# Patient Record
Sex: Female | Born: 1955 | Race: White | Hispanic: No | Marital: Married | Smoking: Current every day smoker
Health system: Southern US, Community
[De-identification: ages and names within clinical notes are randomized; demographics above are authoritative.]

## PROBLEM LIST (undated history)

## (undated) DIAGNOSIS — E2740 Unspecified adrenocortical insufficiency: Secondary | ICD-10-CM

## (undated) DIAGNOSIS — T7840XA Allergy, unspecified, initial encounter: Secondary | ICD-10-CM

## (undated) DIAGNOSIS — E237 Disorder of pituitary gland, unspecified: Secondary | ICD-10-CM

## (undated) HISTORY — PX: ABDOMINAL HYSTERECTOMY: SHX81

## (undated) HISTORY — DX: Unspecified adrenocortical insufficiency: E27.40

## (undated) HISTORY — PX: TUBAL LIGATION: SHX77

## (undated) HISTORY — PX: TONSILLECTOMY: SUR1361

## (undated) HISTORY — DX: Disorder of pituitary gland, unspecified: E23.7

## (undated) HISTORY — PX: CHOLECYSTECTOMY: SHX55

## (undated) HISTORY — DX: Allergy, unspecified, initial encounter: T78.40XA

## (undated) HISTORY — PX: OTHER SURGICAL HISTORY: SHX169

---

## 1998-09-13 ENCOUNTER — Ambulatory Visit (HOSPITAL_COMMUNITY): Admission: RE | Admit: 1998-09-13 | Discharge: 1998-09-13 | Payer: Self-pay | Admitting: Urology

## 1999-06-30 ENCOUNTER — Other Ambulatory Visit: Admission: RE | Admit: 1999-06-30 | Discharge: 1999-06-30 | Payer: Self-pay | Admitting: *Deleted

## 1999-07-17 ENCOUNTER — Other Ambulatory Visit: Admission: RE | Admit: 1999-07-17 | Discharge: 1999-07-17 | Payer: Self-pay | Admitting: Radiology

## 1999-07-30 ENCOUNTER — Other Ambulatory Visit: Admission: RE | Admit: 1999-07-30 | Discharge: 1999-07-30 | Payer: Self-pay | Admitting: *Deleted

## 1999-07-30 ENCOUNTER — Encounter (INDEPENDENT_AMBULATORY_CARE_PROVIDER_SITE_OTHER): Payer: Self-pay

## 2000-02-28 ENCOUNTER — Emergency Department (HOSPITAL_COMMUNITY): Admission: EM | Admit: 2000-02-28 | Discharge: 2000-02-28 | Payer: Self-pay | Admitting: *Deleted

## 2000-03-05 ENCOUNTER — Ambulatory Visit (HOSPITAL_COMMUNITY): Admission: RE | Admit: 2000-03-05 | Discharge: 2000-03-05 | Payer: Self-pay | Admitting: Internal Medicine

## 2000-03-11 ENCOUNTER — Encounter: Payer: Self-pay | Admitting: Orthopedic Surgery

## 2000-03-11 ENCOUNTER — Encounter: Admission: RE | Admit: 2000-03-11 | Discharge: 2000-03-11 | Payer: Self-pay | Admitting: Orthopedic Surgery

## 2000-05-23 ENCOUNTER — Emergency Department (HOSPITAL_COMMUNITY): Admission: EM | Admit: 2000-05-23 | Discharge: 2000-05-23 | Payer: Self-pay | Admitting: Emergency Medicine

## 2000-08-22 ENCOUNTER — Emergency Department (HOSPITAL_COMMUNITY): Admission: EM | Admit: 2000-08-22 | Discharge: 2000-08-22 | Payer: Self-pay | Admitting: Emergency Medicine

## 2000-09-07 ENCOUNTER — Encounter (INDEPENDENT_AMBULATORY_CARE_PROVIDER_SITE_OTHER): Payer: Self-pay | Admitting: Specialist

## 2000-09-07 ENCOUNTER — Other Ambulatory Visit: Admission: RE | Admit: 2000-09-07 | Discharge: 2000-09-07 | Payer: Self-pay | Admitting: *Deleted

## 2000-10-04 ENCOUNTER — Other Ambulatory Visit: Admission: RE | Admit: 2000-10-04 | Discharge: 2000-10-04 | Payer: Self-pay | Admitting: *Deleted

## 2000-10-12 ENCOUNTER — Encounter: Payer: Self-pay | Admitting: Obstetrics and Gynecology

## 2000-10-18 ENCOUNTER — Inpatient Hospital Stay (HOSPITAL_COMMUNITY): Admission: RE | Admit: 2000-10-18 | Discharge: 2000-10-20 | Payer: Self-pay | Admitting: Obstetrics and Gynecology

## 2001-01-06 ENCOUNTER — Ambulatory Visit (HOSPITAL_BASED_OUTPATIENT_CLINIC_OR_DEPARTMENT_OTHER): Admission: RE | Admit: 2001-01-06 | Discharge: 2001-01-07 | Payer: Self-pay | Admitting: Orthopedic Surgery

## 2001-02-11 ENCOUNTER — Encounter: Admission: RE | Admit: 2001-02-11 | Discharge: 2001-02-11 | Payer: Self-pay | Admitting: Orthopedic Surgery

## 2001-02-11 ENCOUNTER — Encounter: Payer: Self-pay | Admitting: Orthopedic Surgery

## 2001-07-23 ENCOUNTER — Inpatient Hospital Stay (HOSPITAL_COMMUNITY): Admission: EM | Admit: 2001-07-23 | Discharge: 2001-07-26 | Payer: Self-pay | Admitting: Emergency Medicine

## 2001-07-23 ENCOUNTER — Encounter: Payer: Self-pay | Admitting: Family Medicine

## 2001-07-23 ENCOUNTER — Encounter: Payer: Self-pay | Admitting: Emergency Medicine

## 2001-07-25 ENCOUNTER — Encounter: Payer: Self-pay | Admitting: Family Medicine

## 2001-07-25 ENCOUNTER — Encounter: Admission: RE | Admit: 2001-07-25 | Discharge: 2001-07-25 | Payer: Self-pay | Admitting: Family Medicine

## 2001-07-28 ENCOUNTER — Encounter: Admission: RE | Admit: 2001-07-28 | Discharge: 2001-07-28 | Payer: Self-pay | Admitting: Family Medicine

## 2002-02-27 ENCOUNTER — Encounter: Payer: Self-pay | Admitting: Family Medicine

## 2002-02-27 ENCOUNTER — Encounter: Admission: RE | Admit: 2002-02-27 | Discharge: 2002-02-27 | Payer: Self-pay | Admitting: Neurosurgery

## 2002-03-16 ENCOUNTER — Encounter: Admission: RE | Admit: 2002-03-16 | Discharge: 2002-03-16 | Payer: Self-pay | Admitting: Neurosurgery

## 2002-03-16 ENCOUNTER — Encounter: Payer: Self-pay | Admitting: Neurosurgery

## 2002-03-30 ENCOUNTER — Encounter: Payer: Self-pay | Admitting: Neurosurgery

## 2002-03-30 ENCOUNTER — Encounter: Admission: RE | Admit: 2002-03-30 | Discharge: 2002-03-30 | Payer: Self-pay | Admitting: Neurosurgery

## 2002-04-13 ENCOUNTER — Encounter: Payer: Self-pay | Admitting: Neurosurgery

## 2002-04-13 ENCOUNTER — Encounter: Admission: RE | Admit: 2002-04-13 | Discharge: 2002-04-13 | Payer: Self-pay | Admitting: Neurosurgery

## 2003-05-23 ENCOUNTER — Emergency Department (HOSPITAL_COMMUNITY): Admission: EM | Admit: 2003-05-23 | Discharge: 2003-05-23 | Payer: Self-pay | Admitting: Emergency Medicine

## 2004-01-10 ENCOUNTER — Encounter: Admission: RE | Admit: 2004-01-10 | Discharge: 2004-01-10 | Payer: Self-pay | Admitting: Orthopedic Surgery

## 2004-03-18 ENCOUNTER — Emergency Department (HOSPITAL_COMMUNITY): Admission: EM | Admit: 2004-03-18 | Discharge: 2004-03-18 | Payer: Self-pay | Admitting: Emergency Medicine

## 2004-05-29 ENCOUNTER — Ambulatory Visit (HOSPITAL_BASED_OUTPATIENT_CLINIC_OR_DEPARTMENT_OTHER): Admission: RE | Admit: 2004-05-29 | Discharge: 2004-05-29 | Payer: Self-pay | Admitting: Orthopedic Surgery

## 2004-06-23 ENCOUNTER — Encounter: Admission: RE | Admit: 2004-06-23 | Discharge: 2004-06-23 | Payer: Self-pay | Admitting: Neurosurgery

## 2004-09-17 ENCOUNTER — Encounter: Admission: RE | Admit: 2004-09-17 | Discharge: 2004-09-17 | Payer: Self-pay | Admitting: Sports Medicine

## 2004-10-01 ENCOUNTER — Ambulatory Visit (HOSPITAL_BASED_OUTPATIENT_CLINIC_OR_DEPARTMENT_OTHER): Admission: RE | Admit: 2004-10-01 | Discharge: 2004-10-01 | Payer: Self-pay | Admitting: Orthopedic Surgery

## 2005-07-16 ENCOUNTER — Inpatient Hospital Stay (HOSPITAL_COMMUNITY): Admission: EM | Admit: 2005-07-16 | Discharge: 2005-07-22 | Payer: Self-pay | Admitting: Emergency Medicine

## 2005-07-21 ENCOUNTER — Encounter (INDEPENDENT_AMBULATORY_CARE_PROVIDER_SITE_OTHER): Payer: Self-pay | Admitting: Specialist

## 2006-07-01 ENCOUNTER — Encounter: Admission: RE | Admit: 2006-07-01 | Discharge: 2006-07-01 | Payer: Self-pay | Admitting: Urology

## 2008-05-07 ENCOUNTER — Emergency Department (HOSPITAL_BASED_OUTPATIENT_CLINIC_OR_DEPARTMENT_OTHER): Admission: EM | Admit: 2008-05-07 | Discharge: 2008-05-07 | Payer: Self-pay | Admitting: Emergency Medicine

## 2008-06-29 ENCOUNTER — Encounter: Admission: RE | Admit: 2008-06-29 | Discharge: 2008-06-29 | Payer: Self-pay | Admitting: Gastroenterology

## 2008-07-29 ENCOUNTER — Inpatient Hospital Stay (HOSPITAL_COMMUNITY): Admission: EM | Admit: 2008-07-29 | Discharge: 2008-08-01 | Payer: Self-pay | Admitting: Emergency Medicine

## 2009-05-01 IMAGING — US US ABDOMEN COMPLETE
1 series · 14 of 25 positions shown · non-contrast
Comparison: [REDACTED] abdominal ultrasound 10 [id number], intraoperative
cholangiogram 07/21/2005, [REDACTED] [HOSPITAL] [HOSPITAL] CT angio
chest 05/07/2008.

CLINICAL DATA: Right upper quadrant abdominal pain.
Postcholecystectomy 4229.

ABDOMEN ULTRASOUND
TECHNIQUE: Complete abdominal ultrasound examination was performed
including evaluation of the liver, bile ducts, pancreas, kidneys,
spleen, IVC, and abdominal aorta.

[Series 1: us abdomen complete · 0.32mm/px · 14 of 65 slices shown]
[im 1/65]
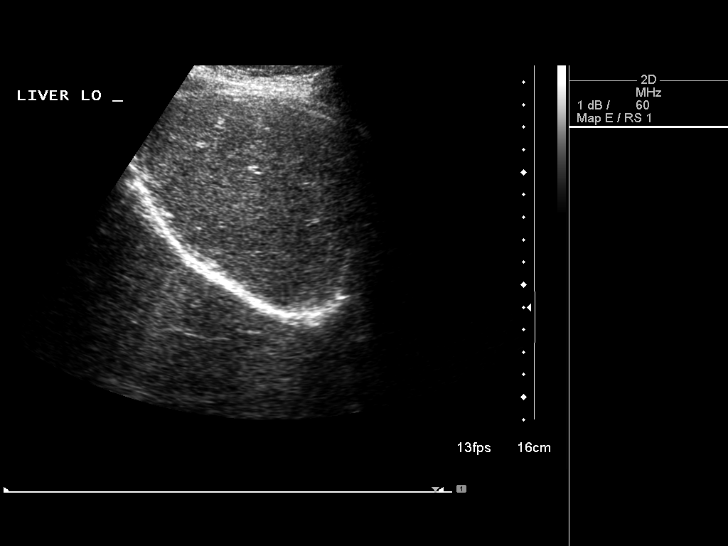
[im 6/65]
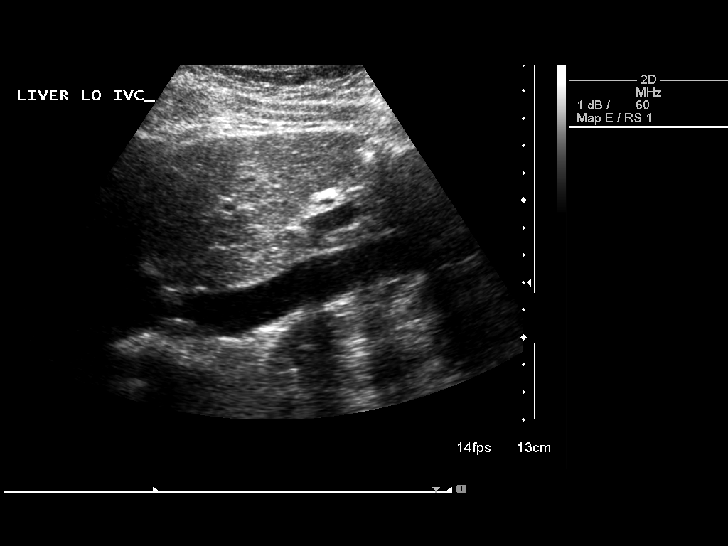
[im 11/65]
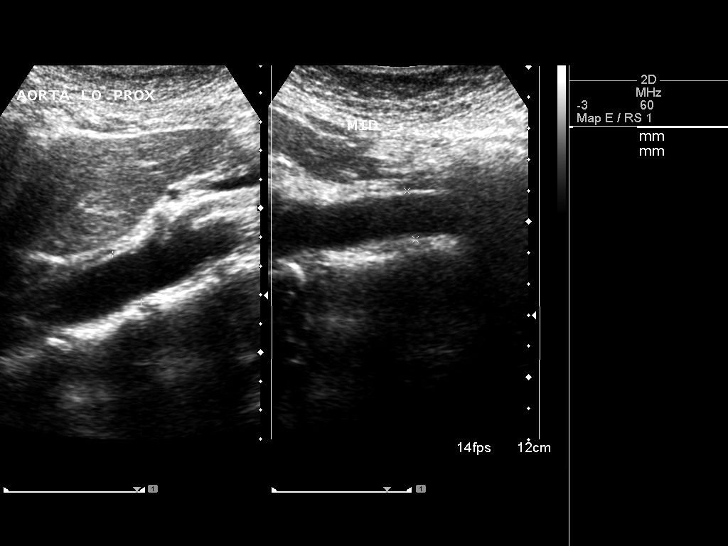
[im 17/65]
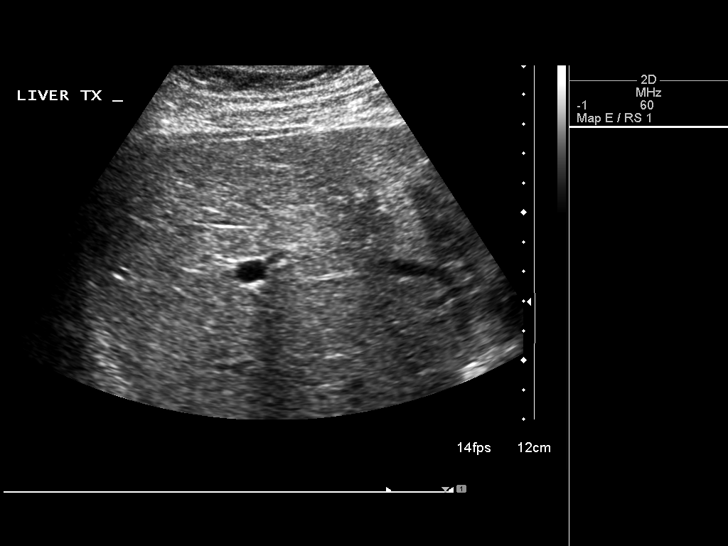
[im 22/65]
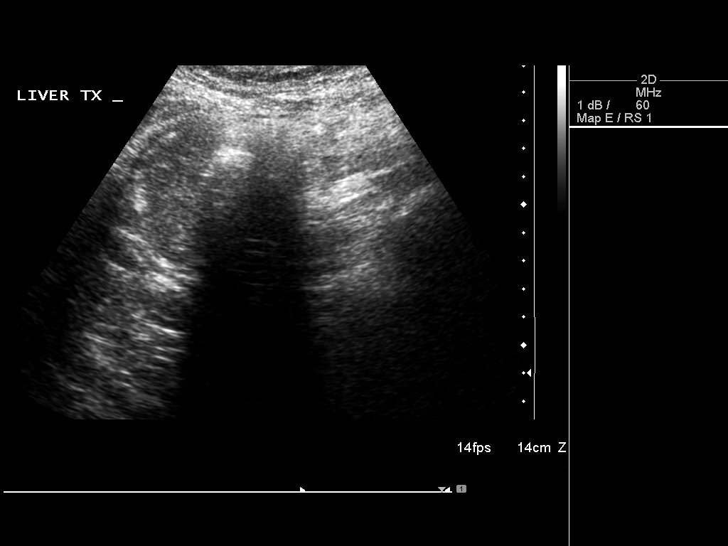
[im 25/65]
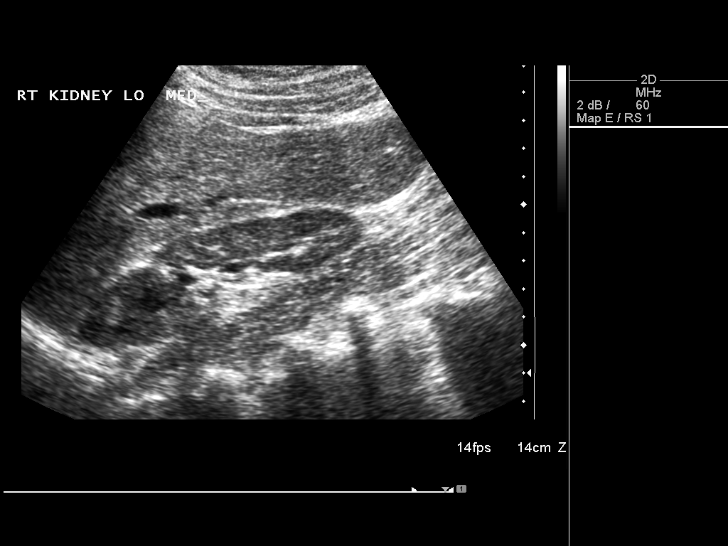
[im 30/65]
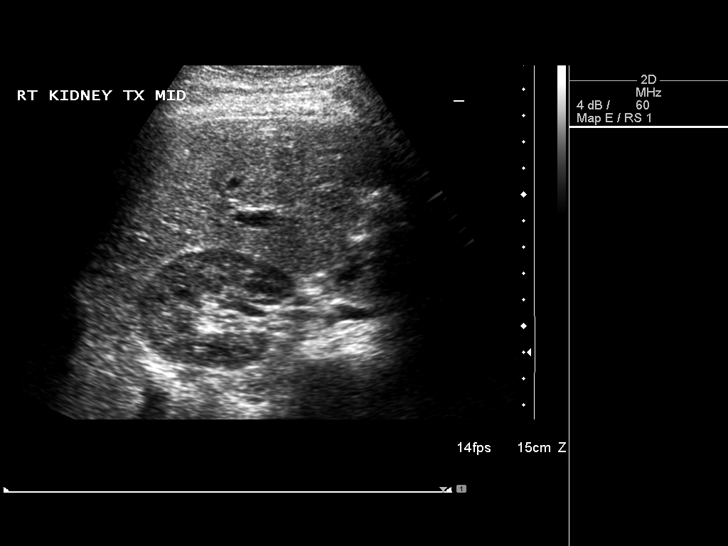
[im 35/65]
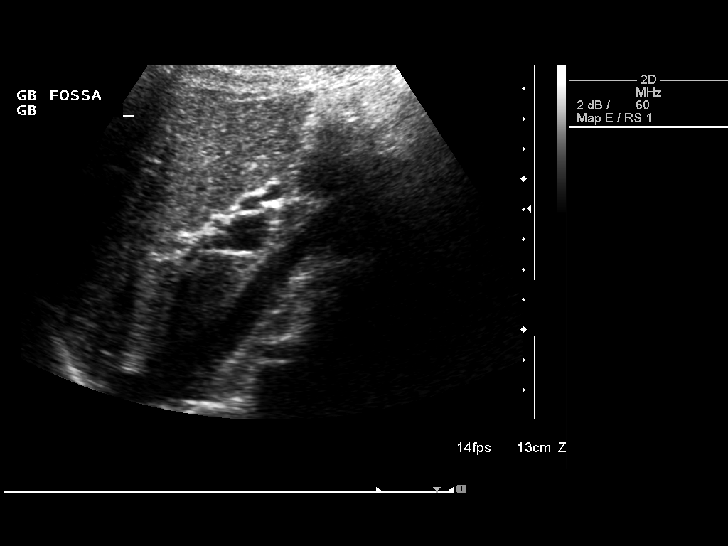
[im 41/65]
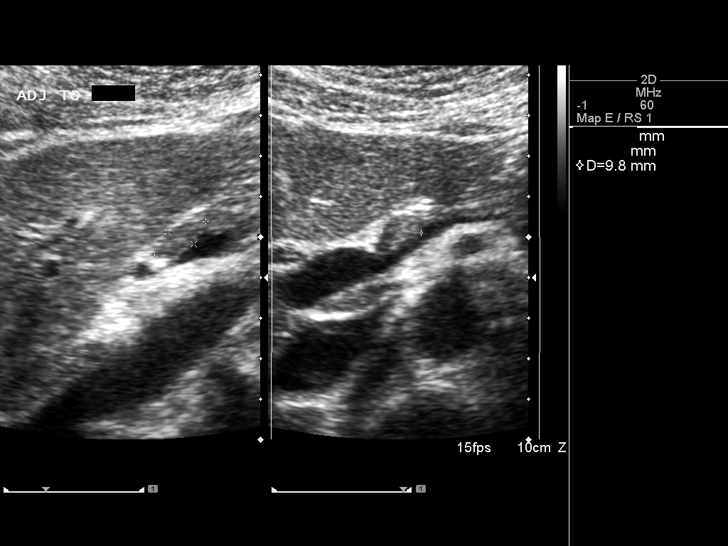
[im 43/65]
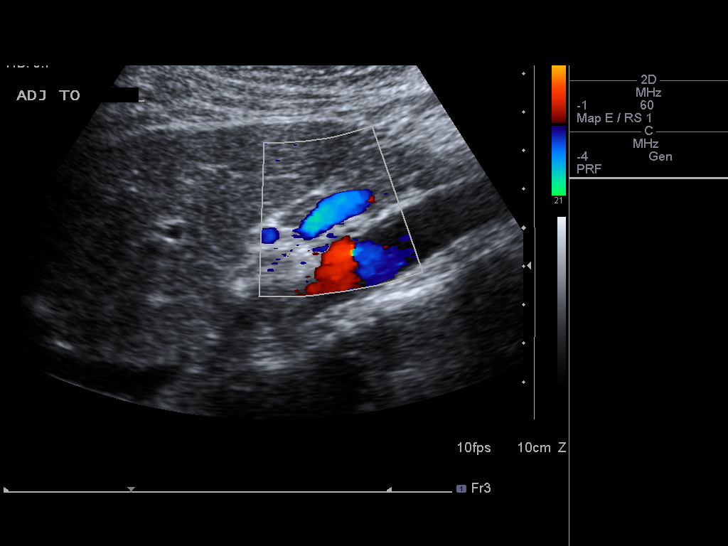
[im 49/65]
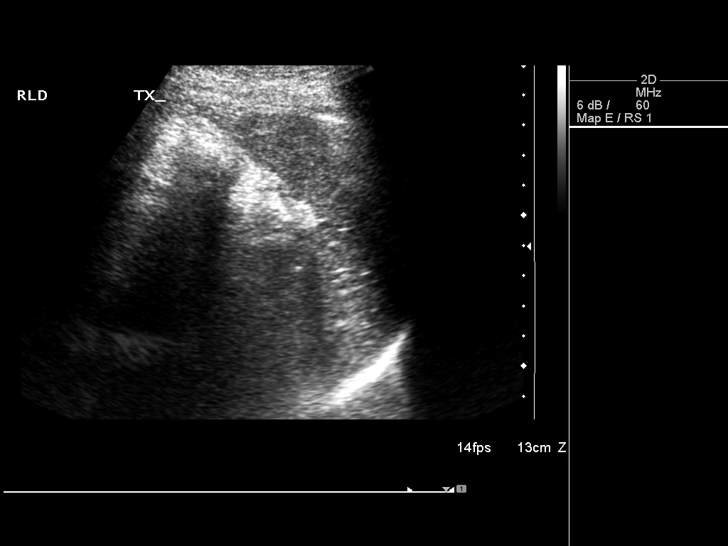
[im 54/65]
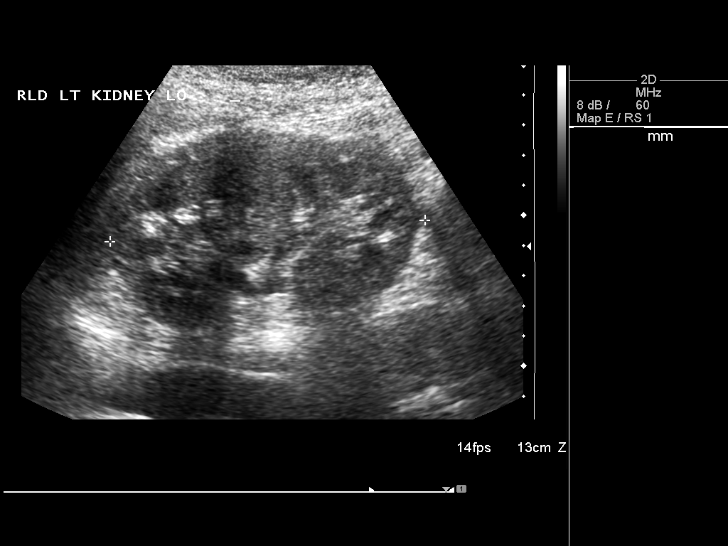
[im 59/65]
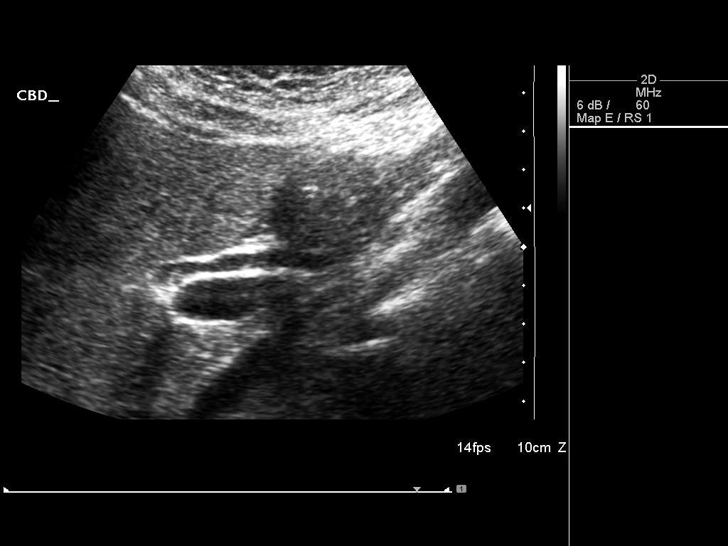
[im 65/65]
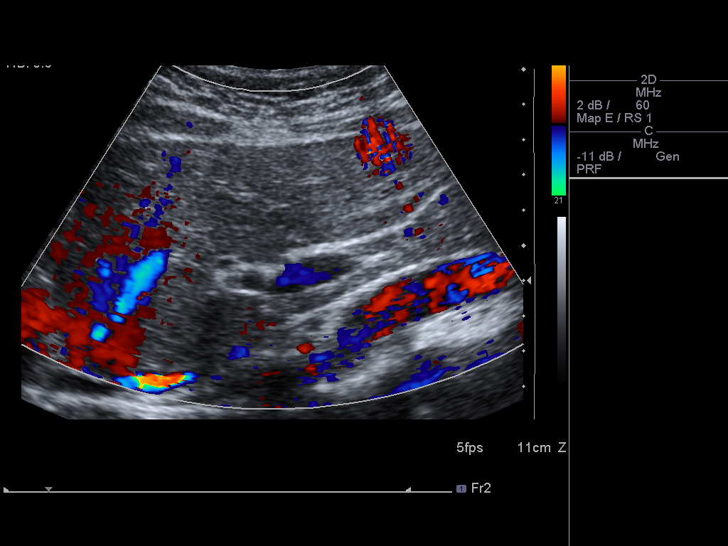

[14 of 25 positions shown; findings below may reference images not displayed]

FINDINGS: The patient is post cholecystectomy with no dilated
intrahepatic or extrahepatic bile ducts.  Common bile duct measures
normally at 7 mm for postcholecystectomy state.  Visualized liver,
inferior vena cava, pancreas, spleen (3.3 cm X 9.6 cm), right
kidney (11.3 cm long), left kidney (10.5 cm long), and abdominal
aorta (maximum diameter 1.9 cm) appear sonographically normal. No
free fluid noted.  Two portacaval lymph nodes with short axis each
measuring 0.7 cm visualized are likely benign.
IMPRESSION: 1.  Post cholecystectomy.
2.  Likely benign two small size portacaval lymph nodes.
3.  Otherwise, normal.

## 2010-10-20 ENCOUNTER — Encounter: Payer: Self-pay | Admitting: Gastroenterology

## 2011-02-10 NOTE — Consult Note (Signed)
NAME:  Elizabeth Lamb, Elizabeth Lamb              ACCOUNT NO.:  [id number]   MEDICAL RECORD NO.:  [id number]          PATIENT TYPE:  INP   LOCATION:  1414                         FACILITY:  [facility]   PHYSICIAN:  Michael L. Reynolds, M.D.DATE OF BIRTH:  May 30, 1956   DATE OF CONSULTATION:  DATE OF DISCHARGE:                                 CONSULTATION   REASON FOR CONSULTATION:  Migraine headache.   HISTORY OF PRESENT ILLNESS:  This is a 55 year old Caucasian female who  presents with intractable migraine headache today.  The patient's  husband states that on the night of October 31 to the morning of  November 1 the patient woke up secondary to eating some bad chicken  soup.  The patient had nausea and vomiting at approximately 2:30 in the  a.m.  Upon nausea and vomiting, the patient had an instantaneous  headache.  The patient took a Percocet and a Phenergan trying to abort  her headache; however, these medications did not help.  Approximately  around 3 a.m. the patient had significant headache in which she  describes it as throbbing behind bilateral eyes and in the frontal  region of her head.  The pain did not radiate to her neck, did not  radiate down her spine.  She had no Lhermitte's, she had no stiff neck,  no photophobia, phonophobia.  She had no aura or paresthesia.  However,  she was not able to keep her food down and unable to keep her  medications down.  The patient, at 3 o'clock in the morning on November  1, was brought to the [facility] ER as her headache was not decreasing.  It is noted that husband states she had no altered speech, no focal or  neurological deficits.  As states she denied blurred vision.  Her  cognition was completely intact throughout the time period of her  headache.  The patient does state that she has a history of migraines  and her typical symptoms include photosensitivity.  She feels better  when she is in a dark room.  Associated with caffeine.  These  headaches  often occur when she takes caffeine or has high, increased stress.  The  patient had a similar incident of migraine where she was seen by Dr.  Anne Hahn back in 2002.  At that time she presented with an altered mental  status and it was found that she had hypokalemia and hyponatremia.  Lumbar puncture taken at that time also showed no abnormalities.  At the  time of interview, the patient is alert, is oriented to time and place.  Is able to converse without difficulty.  She is stating that she has  significant amount of headache, 10 out of 10, bilaterally behind  periorbital region and frontal aspect of her head.  She states that she  has no photophobia but little or slight phonophobia.  Upon talking to  the husband it was noted that patient had recently lost her job on  Thursday due to number of missed days.  According to husband she has  been under a significant amount of stress since  that time.  It is noted  that the husband stated in 2002 workup resulted in what husband calls a  basilar migraine headache.  I could not find this in the notes of the  workup that Dr. Anne Hahn had done.  Under the 2002 note it was noted that  the MRI that was taken showed no stroke and no HSV encephalitis.  As  stated CSF was negative for any bacteria or meningitis; however, at that  time she was still released with 10 days' worth of acyclovir.  On  today's admission the patient was noted also to have hyponatremia once  again in which her sodium levels were 121 at admission, now 124.   PAST MEDICAL HISTORY:  Migraine, depression.   PAST SURGICAL HISTORY:  Cholecystectomy, hysterectomy, tubal ligation,  bilateral rotator cuff repair.   MEDICATIONS:  At home she is only on multivitamin.  While in the  hospital she is on aspirin 81 mg daily, cosyntropin injection 0.24 mg  x1, Lovenox 40 mg subcu q. 24, Reglan 5 mg h.s., nicotine dermal patch  14 mg q.24 h., Protonix 40 mg q.12 h., hydromorphone 0.5  mg to 1 mg q.4  h. p.r.n., oxycodone 5 mg q.4 h. p.r.n., ondansetron 4 mg q.6 h. p.r.n.   ALLERGIES:  VIBRAMYCIN.   FAMILY HISTORY:  Negative.   SOCIAL HISTORY:  The patient smokes one-half pack per day, does not  drink, does not do illicit drugs.  The patient is married and has 2  children.  Recently lost her job.   REVIEW OF SYSTEMS:  All 12 systems were reviewed in depth and are  negative except for the above.   PHYSICAL EXAMINATION:  VITAL SIGNS:  Blood pressure:  Her blood pressure  ranges from 86/46 to 92/49 with pulse of 81, respiratory 20, temperature  99.2.  MENTAL STATUS:  She is alert and oriented, carries out 2 or 3 step  commands without difficulty.  She does states that she feels fuzzy in  the head secondary to the significant amount of Dilaudid that she has  received.  CRANIAL NERVES:  Pupils are equal, round, and reactive to light and  accommodating conjugate gaze, external ocular muscles are intact.  Visual fields are intact.  The face is symmetrical, tongue midline,  uvula midline.  Head turn, shoulder shrug are all within normal limits.  Facial sensation of V1 through V3 is within normal limits.  Coordination, finger to nose, heel to shin, fine motor movements are all  within normal limits.  Gait was within normal limits.  Motor 5/5  strength throughout, no clonus, tremor, contractions.  She has good bulk  and tone.  Deep tendon reflexes were 2+ throughout with the exception of  Achilles which were 1+ with bilateral downgoing toes.  Drift was  negative upper and lower extremities.  Sensation was full to pinprick,  light touch, vibration, proprioception.  PULMONARY:  Clear to auscultation.  CARDIOVASCULAR:  S1, S2, regular rate and rhythm.  NECK:  Negative for bruit and supple.   LABORATORY DATA:  Her AST was 49, ALT 23, protein 5.2, albumin 3.2,  calcium 8.3, CK was 324, CK-MB was 8.1, Troponin 0.01, UA was positive.  Sodium as stated was 121 at admission,  now 124.  Potassium is 3.6.  Chlorine is 95.  CO2 is 22, BUN 5, creatinine 0.53, glucose 71, white  blood cells 8.1, hemoglobin and hematocrit 11.8 and 35.2, platelet 205,  B12 normal, TSH normal, folate normal, cortisol normal.  IMAGING:  She had CT of the head, showed no acute bleed at this time.   ASSESSMENT:  A 55 year old female that suffered a sudden onset of  migraine 48 hours in the past.  She was admitted with sodium of 121, now  124 and a non-relenting headache.  The patient has a history of migraine  headaches and no cardiovascular problems.  She has not suffered  complicated migraine in the past.  She does not see a neurologist at  this time for her headaches; however, she has seen Dr. Anne Hahn in the  hospital in the past.  At this point in time we will start the  dihydroergotamine protocol in which 30 minutes prior to receiving  dihydroergotamine she will get 10 of Reglan and 4 mg of Decadron.  Thirty minutes past she will get 0.5 mg of dihydroergotamine and  she will get this every 8 hours for 9 doses.  We will continue to follow  the patient while in house.  I will confirm this with Dr. Thad Ranger and  we would like her to follow up with [facility]  after hospital stay as she most likely needs to have prophylaxis for, in  the least, abortive treatment on hand for migraine headaches.     ______________________________  Felicie Morn, PA-C      Marolyn Hammock. Thad Ranger, M.D.  Electronically Signed    DS/MEDQ  D:  07/30/2008  T:  07/30/2008  Job:  [id number]   cc:   [facility]   C. Lesia Sago, M.D.  Fax: [phone]

## 2011-02-10 NOTE — Consult Note (Signed)
Elizabeth Lamb, Elizabeth Lamb              ACCOUNT NO.:  [id number]   MEDICAL RECORD NO.:  [id number]          PATIENT TYPE:  INP   LOCATION:  1414                         FACILITY:  [facility]   PHYSICIAN:  Reather Littler, M.D.       DATE OF BIRTH:  Aug 31, 1956   DATE OF CONSULTATION:  DATE OF DISCHARGE:                                 CONSULTATION   REASON FOR CONSULTATION:  Possible Addison's disease.   HISTORY:  This is a 55 year old Caucasian female admitted 2 days ago  with a history of headache, nausea, vomiting for 2 days prior to  admission.  The patient said that she had a migrainous-type, right  temporal headache with mild nausea and vomiting.  She states that the  vomiting was the initial event after eating at somebody else's house.  The patient did not have any diarrhea or abdominal pain.  Currently, the  patient has relief of her vomiting and headache.   Because of her blood pressure being below-normal after admission, the  patient had a serum cortisol level done __________ a question of the  etiology of her hyponatremia.  The patient apparently had a sodium of  121 when evaluated in the emergency room.   On questioning, the patient says she has not had any nausea or decreased  appetite in the last few months.  She has lost any weight.  She has not  had any problems with diarrhea, lightheadedness, or weakness.  She says  that she has not been feeling completely normal, as far as her energy  level, but this is sort of a vague response.  She thinks this will be  relieved in a few days, but her husband thinks that her tiredness has  been present for about six months.  The patient does not think she has  been lightheaded or faint with standing up.  She thinks her blood  pressure is normally between 90 and 98 systolic.  She is a Engineer, civil (consulting) and is  fairly well aware of her symptoms at this time.   Patient also said that she has never been told to have hyponatremia in  the past except in 2001  when she apparently was admitted for what  sounded like a basilar migraine.  There are no outpatient lab results  available.  The outpatient labs reviewed from August of this year showed  a sodium of 136.   HOME MEDICATIONS:  Patient does not really take any medications except  for coated aspirin.   ALLERGIES:  VIBRAMYCIN.   PAST MEDICAL HISTORY:  1. Cholecystectomy.  2. Partial hysterectomy.  3. Tubal ligation.  4. Rotator cuff surgery.  5. Migraine headaches.   FAMILY HISTORY:  Mother has hypothyroidism.  There is no history of type  1 diabetes.   REVIEW OF SYSTEMS:  Patient does not have any history of hypertension,  diabetes, or known thyroid disease.  She has had problems with  occasional migraines.  She thinks she has had some depression,  especially with recently losing her job.   PERSONAL HISTORY:  She smokes a half-pack daily.  She does not  use  alcohol or drugs.  She is married.  She is also postmenopausal.  She  does not know how long, she has had occasional hot flashes in the last  year.   PHYSICAL EXAMINATION:  Patient is alert, cooperative, and very pleasant,  average-built.  Her weight is recorded as 65 kg.  There is no  conjunctival pallor, jaundice, clubbing, lymphadenopathy, or edema.  Her eyes are externally normal.  Fundi not examined.  SKIN:  Diffuse tan all over her body except her face. No increased  pigmentation over bony parts  Does not have any pigmented scars.  There  is no obvious mucosal pigmentation present.  NECK:  No lymphadenopathy or thyroid enlargement.  HEART SOUNDS:  Normal.  LUNGS:  Clear.  ABDOMEN:  No distention, mass, or tenderness.  EXTREMITIES:  Normal.  There is no edema or skin lesions.  NEUROLOGIC:  Gait was not tested.  Patient did not have any obvious  focal motor weakness.   ASSESSMENT:  Patient does have significant depression of her cortisol  level despite presnting with the stressful situation of headache and  also  blood pressures as low as 86 systolic.  The patient also has  hyponatremia which seems to be improving with treatment of her  dehydration and supplementation with Decadron since yesterday.  Her  baseline cortisol was 0.8 on admission.  Her 10 a.m. cortisol level the  next morning was 1.1.  With ACTH stimulation test  her baseline cortisol  was 4.2, drawn at 1 p.m.  At 60 minutes, it was 5.6 which are low   PLAN:  Since she did not obviously have symptoms suggestive of primary  Addison's disease, we will check a TSH and free T4 to rule out  hypopituitarism.  She also has had an MRI scan of the brain today, which  will evaluate her pituitary.  As far as treatment, she has been started  on at least twice-the-normal requirement of hydrocortisone tonight, and  Decadron will be stopped.   Thank you for the consultation.  Will follow.      Reather Littler, M.D.  Electronically Signed     AK/MEDQ  D:  07/31/2008  T:  07/31/2008  Job:  [id number]   cc:   Isidor Holts, M.D.   Lianne Bushy, M.D.  Fax: [phone]

## 2011-02-10 NOTE — H&P (Signed)
NAMEELAINA, CARA NO.:  [id number]   MEDICAL RECORD NO.:  [id number]          PATIENT TYPE:  EMS   LOCATION:  ED                           FACILITY:  [facility]   PHYSICIAN:  Lucita Ferrara, MD         DATE OF BIRTH:  08-Nov-1955   DATE OF ADMISSION:  07/29/2008  DATE OF DISCHARGE:                              HISTORY & PHYSICAL   CHIEF COMPLAINT/HISTORY OF PRESENT ILLNESS:  Intractable migraine  headache.  The patient is a 55 year old Caucasian female who presents to  [facility] with an acute onset of migrainous type of  headache located bitemporal.  Symptoms really started 24 hours ago after  the patient and her husband went to [place] to visit their children.  Patient and husband had chicken stew, which reportedly resulted in  nausea and vomiting at that time without any abdominal pain in the  patient.  After intractable vomitus times 1 and after continuous nausea  throughout the night, the patient's headache became progressively worse.  At that time she had no altered speech, no focal neurological deficits.  She also denied any blurred vision, alterations in her cognition or  speech.  She has had a history of migrainous type of headaches which  presents itself periodically.  Typical symptoms include  photosensitivity, better with the dark room, association with caffeine  and stress, and certain types of food.  She also had a similar admission  back in 2002 at which time she presented with alteration of her mental  status similarly as she is presenting now and she had quite an extensive  workup where she had a lumbar puncture, neurology consultation with Dr.  Anne Hahn, full set of labs.  Her final and discharge diagnosis was  hyponatremia of 122, hypokalemia secondary to nausea and vomiting, and a  neurology consultation, mental status changes secondary to hyponatremia.  Currently she denies any focal neurological deficits.  She is quite  anxious  and agitated.  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  She  supposedly works in [employer].  I do not know the exact circumstances  related to this, but her husband tells me that she had too many sick  days.  Now her husband also states that she seems depressed secondary to  this life changing event, but she denies any suicidal or homicidal  ideations.  It is unclear whether the depression existed throughout the  year when she originally missed work or as result of losing her job.  Other issues at home seem to be okay.  She has also been complaining of  gastrointestinal problems and she is scheduled to have  endoscopy/colonoscopy on the 13th of this month.  She denies any  diarrhea, urinary frequency, urgency or burning.  Her husband gave her  Phenergan and Percocet after the intractable migrainous type of  headache.  In 2002 workup resulted in what the husband called a basal  migraine headache.  Otherwise 12 review of systems negative.  Her  daughter  is also very concerned that she might be depressed.  Daughter  stating that her mother has problems at work and odd behavior for  several years, which appears to be worsening.  She is concerned about  depression and dementia.   PAST MEDICAL HISTORY:  1. As above.  Significant for recurrent migraine headaches.  2. Questionable depression.   SURGICAL HISTORY:  1. Status post cholecystectomy.  2. Status post hysterectomy.  3. Status post tubal ligation.  4. Status post bilateral rotator cuff.   SOCIAL HISTORY:  She denies drugs or alcohol.  She currently smokes one  half-pack per day.   ALLERGIES:  She is a supposedly allergic to VIBRAMYCIN.   MEDICATIONS AT HOME:  Include only a multivitamin.   REVIEW OF SYSTEMS:  The patient denies any recent travel, fevers,  chills, chills or exposure to sick contacts.   PHYSICAL EXAMINATION:  CONSTITUTIONAL:  The patient  is an uncomfortable,  writhing in bed.  VITAL SIGNS:  Blood pressure is 109/63, pulse 74, respirations 18,  temperature 97.9.  HEENT: Normocephalic, atraumatic.  Sclerae is anicteric.  Neck is  supple.  No carotid bruits.  Extraocular muscles intact.  CARDIOVASCULAR:  Stenosis.  Regular rhythm.  LUNGS:  Clear to auscultation bilaterally, no rales or wheezes.  ABDOMEN:  Soft, nontender.  EXTREMITIES:  No edema.  NEURO:  The patient's full neurological examination cannot be fully  assessed secondary to the patient's altered mental status.   LABORATORY DATA:  The patient's blood alcohol level less than 5.  Complete metabolic panel shows sodium of 121, BUN 6, creatinine 0.4.  CBC within normal limits.  Only mild low hemoglobin/hematocrit 11.8 over  35.2.  A urine drug screen essentially negative.  Cardiac markers not  done.   RADIOLOGICAL DATA:  CT scan of the head shows normal.  CT scan of the  head.  There is no acute infarct, hydrocephalus or mass lesions.  In  review of other radiological data the patient had a ultrasound of  abdomen June 30, 1995.  Showed post cholecystectomy and benign 2 small  portacaval lymph nodes.   ASSESSMENT/PLAN:  1. The patient is a 52-hour-old female who is a Designer, jewellery who      presents here with alterations in her mental status.  2. Intractable migrainous the type of headache supposedly typical of      her recurrent migraines and is not the worst headache of her life.  3. Confusion/questionable depression.  Rule out dementia versus      depression.  Early dementia will be a differential diagnosis.  4. Life changing event, loss of job.  5. Hyponatremia.   DISCUSSION AND PLAN:  Certainly, the patient's alterations in mental  status could be secondary to her hyponatremia, which is at level was 121  at this point.  Although there may be a underlying reason for both her  hyponatremia and her alterations in mental status, differential  diagnosis  would include central nervous system pathology either chronic  or acute.  Dementia, depression, decreased p.o. intake would certainly  cause her hyponatremia.  At the same time it would also exacerbate her  migrainous type of headache.  Note, that the patient is not suicidal.  The plan would be really to admit the patient, initiate IV fluids, will  have to monitor her mental status with periodic neurological checks  every 4 hours.  We will start her on alterations in mental status workup  including B12, folate, RPR, ESR, and a  thyroid-stimulating hormone test.  Although the patient has no focal neurological deficits, if she develops  any of these symptoms then will proceed with MRI, MRA of the brain and  further workup including bilateral carotid Dopplers and neurology  consultation.  As far as her hyponatremia, will need to obviously  increase her sodium slowly in order to avoid potential central  demyelinizes.  Obviously, the standard formula will be  used.  Also will initiate SIADH workup with the urine and serum osmol.  Pain control will be initiated with observation not to cause  oversedation or constipation or nausea.  Also we can institute Zofran  for her nausea.  The rest of plans will depend on her progress and  appropriate consultation recommendations.      Lucita Ferrara, MD  Electronically Signed     RR/MEDQ  D:  07/29/2008  T:  07/29/2008  Job:  [id number]

## 2011-02-10 NOTE — Discharge Summary (Signed)
NAMELINNELL, SWORDS NO.:  [id number]   MEDICAL RECORD NO.:  [id number]          PATIENT TYPE:  INP   LOCATION:  1414                         FACILITY:  [facility]   PHYSICIAN:  Hillery Aldo, M.D.   DATE OF BIRTH:  [birth year]   DATE OF ADMISSION:  07/29/2008  DATE OF DISCHARGE:  08/01/2008                               DISCHARGE SUMMARY   ENDOCRINOLOGIST:  Reather Littler, MD   NEUROLOGIST:  Marlan Palau, MD   DISCHARGE DIAGNOSES:  1. Intractable headache status post dihydroergotamine protocol.  2. Addison disease.  3. Hyponatremia.  4. Hypotension.  5. Polymicrobial urinary tract infection.  6. Tobacco abuse.  7. Altered mental status.  8. Hypokalemia.   DISCHARGE MEDICATIONS:  1. Cortef 20 mg q.a.m., 10 mg q.p.m.  2. Cipro 250 mg q.12 h. x3 days.  3. Percocet 5/325 one tablet q.6 h p.r.n.  4. Phenergan 25 mg q.6 h. p.r.n.  5. Multivitamin daily.  6. Aspirin 81 mg daily.  7. Aleve 200 mg t.i.d. p.r.n.   CONSULTATIONS:  1. Reather Littler, MD, of Endocrinology.  2. Marlan Palau, MD, of Neurology.   BRIEF ADMISSION AND HISTORY OF PRESENT ILLNESS:  The patient is a 55-  year-old female who presented to the hospital with a chief complaint of  intractable bitemporal headache.  On initial evaluation, the patient was  found to be hyponatremic with a sodium of 121, and therefore, was  admitted for further evaluation and workup.  For full details, please  see the dictated report done by Dr. Flonnie Overman.   PROCEDURES AND DIAGNOSTIC STUDIES:  1. CT scan of the head on July 29, 2008, was normal.  2. Chest x-ray on July 30, 2008, showed moderate left basilar      atelectasis.   DISCHARGE LABORATORY VALUES:  BNP was 327.  Sodium was 134, potassium  3.7, chloride 104, bicarb 25, BUN 5, creatinine 0.49, glucose 124.  ACTH  stimulation test showed a baseline cortisol 4.2, 30 minutes cortisol at  5.1, 60 minutes cortisol at 5.6.  Urine osmolality was 327, serum  osmolality  254.   HOSPITAL COURSE:  1. Intractable headaches:  The patient was admitted and neurology      consultation was requested.  It was felt that the patient might      have status migrainosus and was put on a DHE protocol.  Her      headache pain resolved after approximately 24 hours and her DHE      protocol was discontinued.  The patient has not had any recurrence      of her headaches.  2. Addison disease/hyponatremia:  The patient's hyponatremia prompted      further diagnostic evaluation with a chief concern of possible      hypercortisolism.  A random cortisol was checked and this was      followed by a cosyntropin test, which  was positive prompting an      endocrinology consult with Dr. Lucianne Muss.  Dr. Lucianne Muss did concur that      the patient likely has Addison disease and felt she would  benefit      from Cortef  therapy.  He did not feel there was any indication to      start Florinef at this time.  Nevertheless, the patient has been      hemodynamically stable and her sodium level is normalizing with a      discharge sodium of 134.  She has followup scheduled with Dr.      Lucianne Muss.  3. Hypotension:  The patient's hypotension was felt to be due to her      underlying Addison disease.  She was hydrated, and once steroids      were started, she became hemodynamically stable.  4. Polymicrobial urinary tract infection:  The patient was empirically      put on Cipro.  She has no complaints of dysuria or hematuria; and      therefore, we will treat her for 3 days of therapy.  5. Tobacco abuse:  The patient was counseled on the importance of      cessation.  A nicotine patch was provided for her while she was in      the hospital.   DISPOSITION:  The patient is medically stable for discharge and will be  discharged home.  She is instructed to follow up with Dr. Anne Hahn in 1-2  months and with Dr. Lucianne Muss in 1 week.   Time spent on co-ordination of care for discharge is 30  minutes.      Hillery Aldo, M.D.  Electronically Signed     CR/MEDQ  D:  08/01/2008  T:  08/02/2008  Job:  [id number]   cc:   Reather Littler, M.D.  Marlan Palau, M.D.

## 2011-02-13 NOTE — Consult Note (Signed)
[place]. [facility]  Patient:    Elizabeth Lamb, Elizabeth Lamb Visit Number: [id number] MRN: [id number]          Service Type: MED Location: 3000 [id number] Attending Physician:  Doneta Public Dictated by:   Marlan Palau, M.D. Proc. Date: 07/23/01 Admit Date:  07/23/2001   CC:         [facility] [address] [facility]                          Consultation Report  HISTORY OF PRESENT ILLNESS:  This patient is a 55 year old right handed white female born 05-11-1956, with a history of disorientation that began sometime last evening. The patient works as a Engineer, civil (consulting) through [employer] and began having some problems with dizziness, "acting weird," disoriented, somewhat aphasic. This seemed to lead into problems with agitation. The patient reported some headache but has not had any fever. The patient has had a problem with what was thought to be a sinus problem the last couple of days. Again, no fever or chills was noted with this. The patient has a history of migraine but has never had aphasia or transient numbness or weakness on the face, arms, or legs, with the headache problems. This patient has required sedation for the agitation. Neurology was asked to see this patient for further evaluation. A CT scan of the brain was done without contrast and appears to be unremarkable.  PAST MEDICAL HISTORY: 1. History of new onset of aphasia and confusion. 2. History of lumbosacral spine surgery 15 years ago. 3. History of hysterectomy in January 2002. 4. History of left rotator cuff surgery in April 2002.  CURRENT MEDICATIONS:  The patient is on now medications.  SOCIAL HISTORY:  The patient smokes 1/2 pack of cigarettes a day. Drinks alcohol on occasion. The patient is married and works as a Engineer, civil (consulting) at [employer]. Lives in [place]. Has 2 children who are alive and well.  ALLERGIES:   VIBRAMYCIN.  FAMILY MEDICAL HISTORY:  Notable for problems with pancreatic cancer in her father. Mother died with a stroke. The patient has no brothers or sisters.  REVIEW OF SYSTEMS:  Essentially unobtainable at this point. The patient says yes to all questions. Not clear that she understands what is being said to her.  PHYSICAL EXAMINATION:  VITAL SIGNS:  Blood pressure 104/60, heart rate 87, respiratory rate 16, temperature afebrile.  GENERAL:  This patient is a well-developed, white female who is sleepy at the time of the examination. Can be easily alerted.  HEENT:  Head is atraumatic. Pupils are equal, round, and reactive to light. Discs are flat bilaterally.  NECK:  Supple. No carotid bruits noted.  RESPIRATORY:  Clear.  CARDIOVASCULAR:  Regular rate and rhythm. No obvious murmurs or rubs are noted.  EXTREMITIES:  Without significant edema.  NEUROLOGIC:  Cranial nerves as above.  Facial symmetry is present. The patient has good sensation of the face to pinprick and soft touch bilaterally. The patient appears to have good facial muscle strength bilaterally. The patient does not blink to threat well from either side. Extraocular movements appear to be relatively intact. With double simultaneous stimulation, the patient tends to focus on things more in her left visual field. Not clear whether visual cut, however, is present. The patient moves all 4 extremities  very well and symmetrically. Seems to have good strength in all 4s. Deep tendon reflexes are depressed but symmetric. The patient appears to have equivocal toes bilaterally. The patient has fair fingers, finger and toe-to-finger. The patient was not ambulating. Speech appeared to be fairly well enunciated, but the patient has difficulty with understanding speech, has significant number of paraphasic errors perseveration. The patient has very poor naming abilities. The patient is able to repeat some simple  phrases.  LABORATORY DATA:  Notable for sodium of 123, potassium 3.1, chloride 86, BUN 5, glucose 81, hemoglobin 13.0, hematocrit 39.0, electrolyte 8.6, platelets 290, MCV 85.2. Drug screen is negative.  CT SCAN OF HEAD:  Unremarkable.  IMPRESSION: 1. New onset of aphasia syndrome. 2. History of tobacco abuse. 3. This patient has only smoking as a risk factor for possible stroke. 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 It is not clear whether the patient has a field    cut off to the right. This patient appeared to have a gradual transition    to the current syndrome with some agitation but no fever. In light of    this, we do need to consider the possibility of herpes encephalitis. The    patient is hyponatremic and could potentially could have some    encephalopathy associated with this. Need to consider and rule out the    possibility of an unwitnessed seizure event and complicated migraine,    given history of migraine headache. Further workup is indicated at this    point.  PLAN: 1. Magnetic resonance imaging scan of the brain with magnetic resonance    imaging angiogram as soon as possible. 2. If magnetic resonance imaging of the brain is to be delayed, would    consider treatment with acyclovir. 3. Lumbar puncture if magnetic resonance imaging of the brain reveals    findings that are suggestive of herpes encephalitis. 4. If stroke is identified, would pursue full stroke workup including    a 2-D echocardiogram, carotid Doppler study, and hypercoagulable state    workup. Would treat currently with intravenous normal saline and aspirin    therapy. If stroke appears to be present by magnetic resonance imaging    scan, would convert to heparin. Will pursue the workup at this time.    Will follow up clinically while in house. Dictated by:   Marlan Palau, M.D.  Attending Physician:  Doneta Public DD:   07/23/01 TD:  07/26/01 Job: [id number]

## 2011-02-13 NOTE — Discharge Summary (Signed)
[place]. [facility]  Patient:    Elizabeth Lamb, Elizabeth Lamb Visit Number: [id number] MRN: [id number]          Service Type: MED Location: 3000 [id number] 01 Attending Physician:  Doneta Public Dictated by:   Mont Dutton, M.D. Admit Date:  07/23/2001 Discharge Date: 07/26/2001   CC:         Maricela Bo, M.D.   Discharge Summary  ADMISSION DIAGNOSES: 1. Mental status changes/aphasia. 2. Hyponatremia. 3. Hypokalemia.  DISCHARGE DIAGNOSES: 1. Resolved altered mental status of unknown etiology. 2. Hyponatremia, resolving.  CONSULTATIONS:  Dr. Anne Hahn, neurology.  PROCEDURES:  Lumbar puncture on November 24, 2001, by Drs. Jomarie Longs, and [place].  No complications.  HISTORY OF PRESENT ILLNESS:  The patient is a 55 year old white female with a one-day history of headache, confusion.  The patient worked a shift last night.  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  Called her husband after the morning shift.  Stated she did not feel well.  Also, was noted to be behaving abnormally early that morning at work.  The patient was noted to be mumbling. Her family noted that her behavior was unlike her, and they had never seen this before.  The only known ingestion was a cold medicine the night prior for a sinus headache, and she had a history of migraine headaches in the past. Now, when seen in the ER, the patient had a perseverant use of the words "nausea" and "vomiting" for answers to questions.  When discussing with her husband, he stated that she complained of a severe headache and dizziness the night prior, and just took the cold medicine for presumed sinus headache.  No recent fever or headache prior to the day of illness.  Denied depression or anxiety.  Of note, the patient did have a recent stressor regarding a memorial service that was to honor her mother, who died in 01/17/2023 of that year.  Her mother had also presented to the hospital with  similar symptoms prior to her death early in that year and was found to have a CVA.  PHYSICAL EXAMINATION:  The patient was noted to be agitated, although alert and oriented x3, with ability of "yes" and "no" questions only.  On neurological exam she was alert.  Sensation was intact in bilateral upper and lower extremities.  Strength was full in upper and lower extremities. Reflexes 2+ bilaterally in upper extremities, 1+ bilaterally in lower extremities.  Cranial nerves and gait were not elicited.  Speech showed some expressive dysfunction; however, no other abnormalities noted on physical exam.  ADMISSION LABORATORY DATA:  ABG revealed pH 7.445, pCO2 37, pO2 78, bicarbonate of 26, O2 saturation of 97%.  CBC:  White blood cell count 8.6, hemoglobin of 12.5, hematocrit 36.7, platelets of 290, with 56% neutrophils, 34% lymphocytes.  Sodium 123, potassium 3.1, chloride 86, glucose 81, BUN 5, creatinine 0.5, calcium 7.9, total protein 5.4, thiamine 3.2.  AST 54, ALT 11, alkaline phosphatase 42, total bilirubin 0.5, magnesium 1.3, phosphorus 2.7. Urine drug screen was negative.  Lumbar puncture completed:  CSF cell count, 3 red blood cells, 4 white blood cells, clear and colorless.  With 52 protein, 51 glucose.  CSF VDRL was nonreactive.  Urinalysis was negative for glucose, bilirubin, ketones, protein, nitrite, leukocyte esterase, 0-2 white blood cells, 7-10 red blood cells, rare bacteria, and a few epithelials.  Blood culture was drawn as well as CSF culture pending.  CSF cryptococcal antigen was negative.  Herpes simplex virus by PCR on CSF was pending.  The patient had an MRI of the brain that showed limited MRI of the brain with diffusion images showing no intracranial abnormality.  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  The patient was admitted to the family practice teaching service.  Attending was Dr. Deirdre Priest.  Neurology was consulted,  Dr. Anne Hahn.  HOSPITAL COURSE: #1 - MENTAL STATUS CHANGES AND APHASIA:  The patient had no CT findings, no history of hypertension or oral contraceptives.  On MRI, no stroke was seen. Was started on acyclovir for possible herpetic encephalitis or viral encephalitis.  Lumbar puncture did not reveal any organisms.  On the day after admission the patients symptoms had resolved.  She was mentating normally. Denied any memory of events on day prior.  All she remembered was being dizzy and having a headache at work.  The patient had an open MRI performed as her original MRI was unable to be completed fully secondary to her movement.  This MRI of brain and MRA were both normal.  CSF was negative for bacterial meningitis.  There was not any current evidence of viral meningitis at that time.  The actual etiology of altered mental status was unknown at the time of discharge; however, could not rule out conversion disorder as stressor in life at that point in time.  As the herpes virus PCR was not yet back, the patient was continued on her acyclovir for an additional seven days at discharge, to complete a total of 10 days in duration.  #2 - HYPONATREMIA:  The patients sodium on admission was 123.  Unsure of etiology.  Considered SIADH, but monitored for improvement with IV fluids on day after admission.  Hyponatremia still present at 122 for sodium.  However, on the day of discharge sodium had increased to 131 with IV fluids.  It is questionable whether this was due to diuretic use, possible SIADH, but this was not a high likelihood based on the urine sodium and osmolality studies. Urine sodium was 163, and serum osmolality was 259.  The patient was just discharged on a normal diet, as hyponatremia was seen to be resolving.  #3 - HYPOKALEMIA:  The patients admission potassium was 3.1.  She was given K-Dur 40 mEq x 2, which improved her potassium to 3.7.  Potassium remained normal throughout  hospitalization.  No further intervention required.  The  patient was discharged to home on July 26, 2001, in stable condition.  DISCHARGE MEDICATIONS:  Acyclovir 800 mg tablet, 1 tablet p.o. t.i.d. for seven days.  FOLLOW-UP:  With Dr. Purnell Shoemaker, her primary health [facility], on August 01, 2001, at 10:45 a.m.  She was also given the telephone number to reschedule if needed, but she is to see her physician within the next one to two weeks.  The patient also had an appointment scheduled with Dr. Anne Hahn, neurologist, in three to four weeks.  His office was to call her to schedule an appointment in the next few days.  She was given the telephone number of this office to call if no one from their facility had reached her in three days.  DISPOSITION:  The patient is discharged to home July 26, 2002.  CONDITION ON DISCHARGE:  Stable. Dictated by:   Mont Dutton, M.D. Attending Physician:  Doneta Public DD:  11/13/01 TD:  11/13/01 Job: [id number]

## 2011-02-13 NOTE — Op Note (Signed)
Elizabeth Lamb, BOUFFARD NO.:  [id number]   MEDICAL RECORD NO.:  [id number]          PATIENT TYPE:  INP   LOCATION:  6711                         FACILITY:  [facility]   PHYSICIAN:  Gita Kudo, M.D. DATE OF BIRTH:  1956/06/24   DATE OF PROCEDURE:  07/21/2005  DATE OF DISCHARGE:                                 OPERATIVE REPORT   OPERATIVE PROCEDURE:  Laparoscopic cholecystectomy with intraoperative  cholangiogram.   SURGEON:  Gita Kudo, M.D.   ASSISTANT:  Wilmon Arms. Tsuei, M.D.   ANESTHESIA:  General endotracheal.   PREOPERATIVE DIAGNOSIS:  Cholecystitis.   POSTOPERATIVE DIAGNOSIS:  Cholecystitis with normal cholangiogram.   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  She also had severe right upper  quadrant abdominal pain.  She thought this might have been due to an  intestinal flu bit when the pain worsened in her right upper quadrant, she  was evaluated for gallbladder disease.  An ultrasound showed sludge.  Her  liver function studies are normal.  She received antibiotics including  Cipro, and her white count dropped.  However, her platelet count is stable  and she is brought for elective, urgent cholecystectomy.   OPERATIVE FINDINGS:  The gallbladder was large, distended, edematous.  The  cystic duct was small, cholangiogram looked normal.   OPERATIVE PROCEDURE:  Under satisfactory general endotracheal anesthesia,  the patient was positioned, prepped and draped in standard fashion.  A total  of 30 mL of 0.5% Marcaine with epinephrine was infiltrated for postop  analgesia at the skin incision sites.  A midline incision was made below the  umbilicus and carried down into the peritoneum through the midline.  Controlled with a figure-of-eight 0 Vicryl suture and operating Hasson port  inserted.  Good CO2 pneumoperitoneum established and camera placed.  Under  direct vision,  two #5 ports placed laterally and a second #10 medially.  Lateral graspers gave excellent exposure and operating through the medial  port, I carefully took down the filmy adhesions around the gallbladder and  then carefully dissected the cystic duct and cystic artery.  When certain of  these structures, a single clip placed on each out towards the gallbladder.  A percutaneous catheter was then placed into the cystic duct and  cholangiogram obtained, which was normal.  The catheter withdrawn and the  duct controlled with multiple clips distally and divided.  Likewise, the  cystic artery had multiple clips placed and divided.  The gallbladder was  then removed from the liver bed from below upward using the coagulating  current for both hemostasis and dissection.  After the gallbladder was  removed from the liver bed, the liver bed was checked for hemostasis, which  was good.  It was lavaged with saline, suctioned dry.   Camera moved to the upper port and an EndoCatch bag placed through the lower  port and the gallbladder placed in it and extracted through the umbilical  port without any complication or problem.  The operative site again checked, lavaged, suctioned.  CO2 and ports  removed.  Then the midline closed with the  previous figure-of-eight and a second interrupted 0 Vicryl.  Subcu all  approximated with 4-0 Vicryl and Steri-Strips for skin.  Sterile absorbent  dressings were then applied and the patient went to the recovery room from  the operating room in good condition.  There were no complications.           ______________________________  Gita Kudo, M.D.     MRL/MEDQ  D:  07/21/2005  T:  07/21/2005  Job:  [id number]   cc:   Lianne Bushy, M.D.  Fax: [phone]

## 2011-02-13 NOTE — Op Note (Signed)
[place]. [facility]  Patient:    LISHA, VITALE                     MRN: [id number] Proc. Date: 01/06/01 Adm. 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  PROCEDURE:  Left shoulder examination under anesthesia, arthroscopy with assessment of rotator cuff.  Arthroscopic acromioplasty.  Excision distal clavicle.  Open repair of chronic rotator cuff tear.  SURGEON:  Loreta Ave, M.D.  ASSISTANT:  Arlys John D. Petrarca, P.A.-C.  ANESTHESIA:  General.  BLOOD LOSS:  Minimal.  SPECIMENS:  None.  CULTURES:  None.  COMPLICATIONS:  None.  DRESSING:  Soft compressive with shoulder immobilizer.  DESCRIPTION OF PROCEDURE:  The patient was brought to the operating room, placed on the operating table in supine position.  After adequate anesthesia had been obtained, left shoulder examined.  Full motion, good stability. Placed in a beach chair position on the shoulder positioner, prepped and draped in usual sterile fashion.  Three standard arthroscopic portals, anterior, lateral and posterior.  Shoulder entered with a blunt obturator, distended and inspected.  A little fraying anterior labrum debrided. Full-thickness tear mid portion supraspinatus tendon within the crescent portion.  Cable still anchored, but there was a 99% through undersurface tear in the middle third of the crescent region.  Biceps tendon intact, as well as biceps anchor.  Capsule and ligamentous structures intact.  Cuff thoroughly assessed and through the lateral portal, a  small amount of methylene blue was injected in that part of the cuff so it could be easily identified at open exposure.  Cannula redirected subacromially.  Chronic impingement.  The top of the cuff only had a very small opening, where the full-thickness tear was, but with methylene blue in place, this could be easily identified.  Type 2 acromion.  Bursa resected, cuff debrided.  Converted to a type 1 acromion with shaver and high-speed bur.  CA ligament released with cautery.  Distal clavicle exposed with grade 3 and 4 changes.  Lateral cm sharply resected with shaver and high-speed bur.  Adequacy of decompression confirmed viewing from all portals.  Instruments and fluid removed.  Lateral portal was opened into a small deltoid-splitting incision.   Skin, subcutaneous tissue, and deltoid incised.  Subacromial space accessed.  Adequacy of decompression confirmed digitally.  The cuff was assessed.  A triangular resection of the area of thinned tissue all but the near full-thickness tear resected back to healthy cuff tissue throughout.  The cuff was then well-captured with nonabsorbable #2 Ethibond.  Brought over through two drill holes created in the humerus with the [employer] System and overtied over bone.  A nice, solid, sound repair with full passive motion without undue tension.  Wound irrigated.   Deltoid closed with 0 Vicryl.  Skin and subcutaneous tissue with Vicryl.  Portals closed with nylon.  Margins of the wound injected with Marcaine.  Sterile compressive dressing and shoulder immobilizer applied.  Anesthesia reversed, brought to recovery room.  Tolerated surgery well, no complications. DD:  01/06/01 TD:  01/06/01 Job: [id number]

## 2011-02-13 NOTE — H&P (Signed)
[place]. [facility]  Patient:    Elizabeth Lamb, Elizabeth Lamb Visit Number: [id number] MRN: [id number]          Service Type: MED Location: [id number] Attending Physician:  Doneta Public Dictated by:   Estill Batten. Deirdre Priest, M.D. Admit Date:  07/23/2001   CC:         [facility], M.D.  [facility], M.D.   History and Physical  DATE OF BIRTH:  2056-04-09  CHIEF COMPLAINT:  Mental status changes.  HISTORY OF PRESENT ILLNESS:  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  She presents to the emergency room complaining of headache and confusion.  She began complaining of feeling badly and having a headache around 8:00 p.m. last night and took a cold and sinus medication. Then around 10:30, she complained of feeling dizzy and having a severe headache.  She was working the night shift at [employer] and was noted to be speaking incoherently and charting orders incorrectly.  Once her shift ended, she went down to the emergency room and registered herself.  She called her husband and told him that she was feeling too dizzy and confused to drive home.  During her stay in the emergency room, [name] became progressively confused and agitated.  [name] and family denied previous episodes of altered mental status. She denies history of anxiety or depression.  She has been under increased stress recently due to plans of a service to honor her mother who died this past 01-16-23.  Of note, her mother presented with similar symptoms prior to her death in 01/16/2023.  [name] denies recent fever or chills prior to yesterday.  She does complain of intermittent pain.  PAST MEDICAL HISTORY:  Occasional migraine headache.  No history of seizure.  MEDICATIONS: 1. Motrin 800 mg p.r.n. headache. 2. [place] Cold Medicine as mentioned in the HPI.  ALLERGIES:  VIBRAMYCIN.  SURGICAL HISTORY:  Back surgery 15 years  ago, hysterectomy secondary to dysfunctional uterine bleeding in January of this year, rotator cuff surgery in 01/15/01.  FAMILY HISTORY:  The mother died in 01/16/2023 of unknown cause.  She had history of stroke, hypertension, congestive heart failure.  SOCIAL HISTORY:  The patient lives with her husband and 90 year old daughter. She also has a 48 year old son.  She smokes about one half pack per day for the past 10 years.  She uses alcohol occasionally.  She works as a Engineer, civil (consulting) on unit [employer] at [employer].  PHYSICAL EXAMINATION:  VITAL SIGNS:  Temperature 97.9, blood pressure 104/69, pulse 87, respiratory rate 16, pulse oximetry 100% on room air.  GENERAL:  This is an agitated, disoriented white female in moderate distress secondary to inability to express herself.  She is able to answer "yes and no" questions appropriately.  HEENT:  Normocephalic, atraumatic.  Pupils are equal, round and reactive to light and accommodation.  Extraocular movements are intact.  Tympanic membranes are pearly gray bilaterally.  No bleeding in the canal.  The oral mucosa is pink and moist and there are no vesicles, exudates or erythema.  NECK:  Supple.  No lymphadenopathy.  No thyromegaly.  RESPIRATORY:  Clear to auscultation bilaterally without wheezes, rales or rhonchi.  CARDIOVASCULAR:  Regular rate and rhythm without murmurs, rubs or gallops. Peripheral pulses are intact.  Capillary refill is less than two seconds.  ABDOMEN:  Soft, nontender, nondistended with normoactive bowel sounds.  No guarding or rebound, no masses.  NEUROLOGIC:  Alert.  Deep tendon reflexes are 2+ in the upper extremities, 1+ in the lower extremities.  Speech: The patient appears to have an expressive aphasia.  Strength is 5/5 in the upper and lower extremities.  Sensation is intact.  EXTREMITIES:  No clubbing, cyanosis, or edema, no rashes.  LABORATORY DATA:  White count is 8.3 with 56% neutrophils, 34%  lymphocytes. Hemoglobin 12.5, hematocrit 36.7, platelets 290.  Sodium is low at 123, potassium 3.1, chloride 86, bicarb 25, BUN 5.  Creatinine 0.5.  Liver enzymes are normal with the exception of AST elevated at 54, ALT 11.  Total protein 5.4, albumin 3.2, calcium 7.9.  Phosphorus is 2.7, magnesium 1.3.  Urine drug screen was negative.  Urinalysis reveals specific gravity of 1.016 with moderate blood, no leukocytes or nitrites.  CT scan of the head revealed chronic ethmoid and sphenoid sinusitis.  No acute changes.  ASSESSMENT AND PLAN: 1. Mental status changes: Unsure of etiology.  Will consult neurology.  Obtain    MRI to rule out acute cerebrovascular accident, herpes encephalitis or    tumor.  The patient is hyponatremic but a level of 123 would unlikely cause    this degree of confusion, disorientation and agitation.  Intoxication is    unlikely with a negative urine drug screen and history of working for the    previous eight hours.  An unusual reaction to one of the ingredients in    the cold and sinus medication is a possibility, although unlikely. Conversion disorder remains in the differential. 2. Hyponatremia: Will obtain urine studies to evaluate for SIADH.  Meanwhile,    will give normal saline at maintenance. 3. Hypokalemia.  Will replete with K-Dur.  No known diuretic use or other    etiology at this time. 4. Anxiety/agitation:  Will treat with Haldol p.r.n. and Ativan for severe    anxiety. Dictated by:   Estill Batten. Deirdre Priest, M.D. Attending Physician:  Doneta Public DD:  07/24/01 TD:  07/25/01 Job: [id number]

## 2011-02-13 NOTE — Op Note (Signed)
NAME:  Elizabeth Lamb, Elizabeth Lamb                        ACCOUNT NO.:  [id number]   MEDICAL RECORD NO.:  [id number]                   PATIENT TYPE:  AMB   LOCATION:  DSC                                  FACILITY:  [facility]   PHYSICIAN:  Loreta Ave, M.D.              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   OPERATIVE PROCEDURE:  Right shoulder exam under anesthesia, arthroscopy,  debridement of labrum, assessment of shoulder, acromioplasty, debridement of  rotator cuff above and below, bursectomy, coracoacromial ligament release,  excision distal clavicle.   SURGEON:  Loreta Ave, M.D.   ASSISTANT:  Arlys John D. Petrarca, P.Lamb.-C.   ANESTHESIA:  General.   ESTIMATED BLOOD LOSS:  Minimal.   SPECIMENS:  None.   CULTURES:  None.   COMPLICATIONS:  None.   DRESSINGS:  Soft compressive with Lamb sling.   PROCEDURE:  The patient was brought to the operating room and placed on the  operating table in supine position.  After adequate anesthesia had been  obtained, the right shoulder was examined.  She has global laxity with  increased mobility of both shoulders.  Anterior inferior subluxation of both  shoulders but no true dislocation.  Good stability, otherwise.  She was  placed in Lamb beach chair position on the shoulder positioner, prepped and  draped in the usual sterile fashion.  Three portals, anterior, posterior,  and lateral.   The shoulder was  entered with Lamb blunt obturator, distended,  and inspected.  She definitely has increased mobility with anteroinferior  subluxation but no real evidence of instability is Lamb problem intra-  articularly.  Did not have internal impingement but more classic impingement  with partial tearing anterior aspect supraspinatus tendon that was debrided.  No full thickness tears.  Roughing of the labrum and superiorly debrided.  Biceps tendon and biceps anchor and remaining capsule and ligamentous  structures all intact.  Lamb little roughening of the top of the head  consistent with classic internal impingement or instability.  Debrided.  Cannula redirected subacromially.  Type 2-3 acromion, reactive bursitis and  abrasive tearing on the top of the cuff anteriorly.  No full thickness  tears.  Bursa resected, cuff debrided.  Acromioplasty to Lamb type 1 acromion  with shaver and Lamb high speed bur.  CA ligament released with cautery.  Distal clavicle grade 4 changes.  Lateral cm resected.  Adequacy of  decompression and clavicle resection confirmed viewing from  all portals.  The instruments and fluid removed.  The portals, shoulder, and bursa  injected with Marcaine.  The portals were closed with 4-0 nylon.  Sterile  compressive dressing applied.  Anesthesia reversed.  Brought to the recovery  room.  Tolerated the surgery well without complications.                                               Loreta Ave, M.D.    DFM/MEDQ  D:  05/29/2004  T:  05/29/2004  Job:  [id number]

## 2011-02-13 NOTE — Discharge Summary (Signed)
[name], Elizabeth Lamb NO.:  [id number]   MEDICAL RECORD NO.:  [id number]          PATIENT TYPE:  INP   LOCATION:  6711                         FACILITY:  [facility]   PHYSICIAN:  Elizabeth Lamb, M.D.    DATE OF BIRTH:  11-Jan-1956   DATE OF ADMISSION:  07/16/2005  DATE OF DISCHARGE:  07/22/2005                                 DISCHARGE SUMMARY   DISCHARGE DIAGNOSES:  1.  Acute cholecystitis., status post laparoscopic cholecystectomy by Dr.      Maryagnes Lamb on October24,2006.  2.  Pancytopenia thought to be secondary to Cipro and/or Flagyl.  3.  Hypotension secondary to volume depletion.  4.  Asymptomatic hypoglycemia secondary to decreased p.o. intake.  5.  Presenting symptoms with abdominal pain, nausea, vomiting and diarrhea.   DISCHARGE MEDICATIONS:  1.  Ferrous sulfate 325 mg twice daily.  2.  Multivitamin with iron once daily.  3.  Vicodin one tablet every 4-6 hours as needed for pain.  4.  Colace 100 mg twice daily.   DISCHARGE DISPOSITION:  The patient was discharged home in improved and  stable condition.  She was advised to follow up with Dr. Maryagnes Lamb as  recommended.  She was advised to follow up with her primary care physician,  Dr. Purnell Lamb, in 2 weeks.   CONSULTATIONS:  Surgeons, Dr. Luisa Lamb and Dr. Maryagnes Lamb.   PROCEDURES PERFORMED:  1.  CT scan of the abdomen and pelvis on October22,2006.  The results      revealed enlarged gallbladder with extensive pericholecystic fluid or      potentially wall thickening raises concern for acute cholecystitis.      Displacement of pancreatic head by mass effect from gallbladder and      surrounding fluid/wall thickening.  Sub-centimeter cystic lesion in the      right kidney.  A moderate amount of free fluid in the pelvis.      Rectosigmoid colon is unremarkable.  The ovaries appear to be intact.      Status post hysterectomy.  2.  Ultrasound of the abdomen on October23,2006.  The results revealed      gallbladder sludge  without evidence of acute cholecystitis.  Bilateral      pleural effusions.  Right kidney measures 11.2 cm and the left kidney      measures 10.8 cm.  3.  HIDA scan on October23,2006.  The results revealed non-visualization of      the gallbladder, suggesting cystic duct obstruction.  4.  Status post cholecystectomy by Dr. Maryagnes Lamb.   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 She presented to the emergency department on  October19,2006 with a chief complaint of persistent nausea, vomiting and  diarrhea for 24 hours.  The patient denied hematemesis, melena or  hematochezia.  She  also complained of crampy abdominal pain which was  moderate in intensity.  The patient did report being exposed to several sick  contacts at work.  She was subsequently admitted for further evaluation and  management of a presumed gastroenteritis. For additional details, please  see  the dictated history and physical..   Lamb COURSE:  PROBLEM #1 - ACUTE CHOLECYSTITIS:  As stated above, the  patient's presenting symptoms were consistent with a gastroenteritis.  The  patient was noted to be hypotensive on admission with a blood pressure of  75/43.  She was febrile with a temperature of 100.9.  Her white blood cell  count was within normal limits at 7.4.  When she was evaluated in the  emergency department, an abdominal film was ordered.  The abdominal film  revealed no acute findings.  The patient was bolused with IV fluids in the  emergency department.  This was followed by IV fluids with normal saline at  150 mL an hour.  Investigational studies were ordered including lipase,  stool specimens, and blood cultures.  Her lipase was within normal limits at  29.  The urinalysis revealed a trace of leukocytes.  The followup urine  culture revealed 75,000 colonies of multiple bacteria, nonpathogenic.  Her  LFTs were only marginally elevated with an SGOT of 49.   Prior to the  collection of the stool specimen, the patient became persistently febrile  with a temperature ranging between 100 and 101.8.  A decision was made to  start the patient on empiric antibiotics with Cipro and Flagyl.  Blood  cultures were ordered when the patient again spiked a fever.  The blood  cultures during the Lamb course remained negative.   The patient's fever resolved.  Her diarrhea subsided and then resolved.  However, the patient became more symptomatic with  abdominal pain primarily  in the right upper and right lower quadrant.  This was concerning.  Therefore, a CT scan of the abdomen and pelvis was ordered.  While the  results were pending of the CT scan of the abdomen, the results of the stool  C. difficile toxin became negative.  Shortly thereafter, the results of the  CT scan of the abdomen and pelvis revealed an enlarged gallbladder with  extensive pericholecystic fluid or potential wall thickening.  Following  this finding, surgeon, Dr. Luisa Lamb, was consulted.  Dr. Luisa Lamb recommended  further evaluation with an ultrasound of the abdomen and a HIDA scan.  The  ultrasound of the abdomen revealed gallbladder sludge without evidence of  acute cholecystitis and bilateral pleural effusions.  The HIDA scan revealed  nonvisualization of the gallbladder suggesting cystic duct obstruction.  These findings were consistent with gallbladder disease.  The followup  surgical evaluation was provided by Dr. Maryagnes Lamb.  Dr. Maryagnes Lamb evaluated the  patient and decided to perform a cholecystectomy.  The laparoscopic  cholecystectomy was performed on October24,2006.  During the operation, a  cholangiogram was performed and it was normal.  Per Dr. Caryn Lamb operative  report, the patient had acute cholecystitis with a normal cholangiogram.  The patient tolerated the procedure well.   Following the operation, the patient was started on a liquid diet.  Her pain was managed with  Vicodin.  She was ambulating shortly after the operation.  The following day, Dr. Maryagnes Lamb felt that the patient was stable enough for  Lamb discharge.  It is important to note that the patient's SGOT did  increase to 61; however, the remaining liver transaminases were within  normal limits.   PROBLEM #2 -  PANCYTOPENIA THOUGHT TO BE SECONDARY TO CIPRO AND/OR FLAGYL:  As stated above, the patient was started empirically on Cipro and Flagyl.  However, the day following treatment, her white blood cell count, red blood  cell count, hemoglobin, and platelets all decreased.  Her WBC fell to 2.8,  the hemoglobin fell to 8.9, the red blood cell count fell to 3.18, and  the  platelets fell to 133,000.  Given this dramatic decline, it was believed  that the Cipro and/or Flagyl were the culprits.  The Cipro and Flagyl were  therefore discontinued.  The patient's CBC was followed daily.  Following,  the white blood cell count rebounded to 3.9, the hemoglobin increased to  9.3, and the platelets increased to 164,000.  Following the operation, the  white blood cell count increased to 5.9, the hemoglobin fell slightly to  8.5, and the platelets increased to 189,000.  It is unclear if the  pancytopenia was caused by Cipro alone or Flagyl alone, or a combination of  both with synergistic effects.  However, the patient was advised to avoid  Cipro and Flagyl in the future.  A CBC will be drawn in 2 days for  reevaluation.  The results will be called to the patient and her primary  care physician.  The patient was advised to take a multivitamin with iron  along with ferrous sulfate 325 mg twice daily.   PROBLEM #3 - ASYMPTOMATIC HYPOGLYCEMIA:  The patient's venous glucose on  October23,2006 was read as 39.  The followup CBG  was 41.  The patient was  completely asymptomatic at the time.  She was, however, started on D-5  normal saline which followed an ampule of D-50.  The patient's capillary  blood  sugars remained above normal range during the remainder of Lamb  course.  The hypoglycemia was felt to be secondary to poor p.o. intake over  the past few days.  The patient had been started on D-5 normal saline  several days prior; however, it was discontinued when the patient began to  eat semisolid foods.  Once the CT scan results became available, the patient  was restarted on normal saline without dextrose added.  At the time of  Lamb discharge, her blood glucose was 88.   DISCHARGE LABORATORY DATA:  Sodium 142, potassium 3.7, chloride 112, CO2 26,  glucose 88, BUN 2, creatinine 0.6, calcium 8.3.  Blood cultures negative.  Total bilirubin 0.3, direct bilirubin 0.1, indirect bilirubin 0.2, alkaline  phosphatase 35, SGOT 61, SGPT 19, total protein 4.6, albumin 2.8.  WBC 5.9,  hemoglobin 8.5, hematocrit 25.2, MCV 83.2, platelets 189,000.      [facility], M.D.  Electronically Signed     DF/MEDQ  D:  07/23/2005  T:  07/23/2005  Job:  [id number]  cc:   Gita Kudo, M.D.  [address], [address]  [place]  [place] [address]   Lianne Bushy, M.D.  Fax: [phone]

## 2011-02-13 NOTE — Op Note (Signed)
NAMEDORRI, OZTURK NO.:  [id number]   MEDICAL RECORD NO.:  [id number]          PATIENT TYPE:  AMB   LOCATION:  DSC                          FACILITY:  [facility]   PHYSICIAN:  Loreta Ave, M.D. DATE OF BIRTH:  1955-10-16   DATE OF PROCEDURE:  10/01/2004  DATE OF DISCHARGE:                                 OPERATIVE REPORT   PREOPERATIVE DIAGNOSIS:  Right rotator cuff tear after previous  decompression and new traumatic injury.   POSTOPERATIVE DIAGNOSIS:  Right rotator cuff tear after previous  decompression and new traumatic injury.   PROCEDURE:  Right shoulder  examination under anesthesia, arthroscopy,  debridement rotator cuff.  Mini open debridement further and then repair  rotator cuff tear with a Nautilus bioabsorbable Mitek anchor anteriorly and  FiberWire and Concept Repair System posteriorly.   SURGEON:  Loreta Ave, M.D.   ASSISTANT:  Genene Churn. Denton Meek.   ANESTHESIA:  General anesthesia.   ESTIMATED BLOOD LOSS:  Minimal.   SPECIMENS:  None.   CULTURES:  None.   COMPLICATIONS:  None.   DRESSING:  Soft compressive and shoulder immobilizer.   DESCRIPTION OF PROCEDURE:  Patient brought to the operating room and placed  on the operating table in supine position.  After adequate anesthesia had  been obtained, right shoulder examined.  Full motion and good stability.  Placed in beach chair positioner on shoulder positioner.  Prepped and draped  in usual sterile fashion.  Two portals, one lateral and one posterior.  Shoulder and blunt obturator distended and inspected.  Marked thinning  throughout the crest region of the cuff with new tearing compared to her  previous arthroscopy.  One small full thickness tear but marked thinning of  the entire crescent.  Remaining structures normal.  Cannula redirected  subarcomially.  Adequacy of previous decompression confirmed. Cuff debrided.  Although there is a small full thickness tear that is  really attrition  throughout the crescent from previously as well as a new traumatic injury.  I therefore elected to excise the area of attrition and then do a primary  repair in order to insure best outcome.  Arthroscope and fluid removed.  Lateral portal opened into a deltoid splitting incision.  The cuff was then  exposed, debrided back down with the tissue mobilizer.  The anterior aspect  was repaired with a Nautilus bioabsorbable anchor and nonabsorbable suture  where I had excellent fixation and capturing with a Nautilus anchor.  When I  attempted to repair the back in the same manner, I could never get the  anchor to really hold the cuff down there.  I therefore weaved a FiberWire  suture well in the cuff and then brought it through drill holes and then  firmly tied it down over a bony bridge using the Concept Repair System.  With this technique, the entire cuff repaired now into a nice bed of  bleeding bone.  Nice firm closure with full passive motion.  Wound  irrigated.  Deltoid closed with  Vicryl.  Skin and subcutaneous tissue with Vicryl.  Portal closed with  nylon.  Margins in with Marcaine.  A sterile compressive dressing applied.  Shoulder immobilizer applied. Anesthesia reversed.  Brought to recovery  room.  The patient tolerated the procedure well with no complications.      Valentino Saxon   DFM/MEDQ  D:  10/02/2004  T:  10/02/2004  Job:  [id number]

## 2011-02-13 NOTE — Consult Note (Signed)
Elizabeth Lamb, Elizabeth Lamb              ACCOUNT NO.:  [id number]   MEDICAL RECORD NO.:  [id number]          PATIENT TYPE:  INP   LOCATION:  6711                         FACILITY:  [facility]   PHYSICIAN:  Thomas A. Cornett, M.D.DATE OF BIRTH:  1956/07/28   DATE OF CONSULTATION:  07/18/2005  DATE OF DISCHARGE:                                   CONSULTATION   REASON FOR CONSULTATION:  Abdominal pain.   HISTORY OF PRESENT ILLNESS:  The patient is a 55 year old female whom I was  asked to see at the request of Dr. Sherrie Mustache in consultation for abdominal  pain.  She is admitted on July 16, 2005 with a 1-day history of nausea,  vomiting, and diarrhea.  This got so severe that she became dehydrated and  was admitted to the hospital.  Initially she was felt to have  gastroenteritis and was placed on Cipro and Flagyl, also due to a concern  for C. difficile colitis.  Her diarrhea has resolved, but now she has right-  sided abdominal pain.  The pain is constant and in her right upper and right  lower quadrant.  She also is bloated as well.  There is no significant  radiation of pain; and the pain has been pretty constant.  Of note, she had  some hypertension, requiring at least 3-4 liters of fluid over the 24-hour  period she was admitted initially.  She denies any blood in her stool.  Her  pain is crampy in nature in her mid abdomen.  A CT scan done today showed  some fluid in her abdomen, some pericholecystic fluid.   ALLERGIES:  VIBRAMYCIN, DOXYCYCLINE.   MEDICATIONS:  A multivitamin.   PAST MEDICAL HISTORY:  1.  History of migraine headaches.  2.  History of lumbar laminectomy.  3.  History of right rotator cuff surgical repair.  4.  History of hysterectomy due to dysfunctional uterine bleeding.  5.  History of anemia secondary to dysfunctional uterine bleeding.  6.  History of stress urinary incontinence.  7.  Hyponatremia and hypokalemia.  8.  Microhematuria.  9.  Tonsillectomy in  childhood.  10. History of laparoscopic tubal ligation.   FAMILY HISTORY:  Positive for diabetes mellitus, hypertension, congestive  heart failure, and also pancreatic cancer.   SOCIAL HISTORY:  She is married.  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  Intermittent use of tobacco or alcohol.  Denies any drug use.   SURGICAL HISTORY:  As stated above.   REVIEW OF SYSTEMS:  As stated above, otherwise 10-review of systems is  negative.   PHYSICAL EXAMINATION:  VITAL SIGNS:  Temperature T-max is 101.8, now it is  98; blood pressure 88/52; heart rate 56.  GENERAL APPEARANCE:  A pleasant, white female in no apparent distress.  HEENT:  No sclerae icterus.  Oropharynx is clear.  NECK:  Supple, nontender.  CHEST:  Clear to auscultation without rhonchi or rales.  CARDIOVASCULAR:  Regular rate and rhythm without rub, murmur, or gallop.  ABDOMEN:  Soft with some mild right and lower quadrant tenderness.  This  appears to  be more in her epigastrium than in her right lower quadrant.  No  significant rebound or guarding.  Abdomen is mildly distended.  Bowel sounds  are present.  EXTREMITIES:  No clubbing, cyanosis, nor edema.  NEUROLOGIC EXAM:  Grossly intact.   DIAGNOSTIC STUDIES:  She has a white count of 3800, hemoglobin was low at  8.9, now is 9.2 and she is thrombocytopenic at 121,000.  Electrolytes are  within normal limits.   IMPRESSION:  Abdominal pain with a drop in her white count and platelets  secondary to antibiotic use of Cipro and Flagyl as well as Lovenox, now with  a right-sided pain.  There was some free-fluid on her CT scan in her abdomen  as well.   PLAN:  At this point in time we will evaluate her gallbladder for  cholecystitis by getting any ultrasound since no stones were seen and a HIDA  study to exclude acute cholecystitis.  Free fluid on abdominal CT can be  misleading and can be seen with gastroenteritis as well.  Once these studies  are done, we can  further dictate where else to go at this point.  She is in  no apparent distress at this point in time.  I have explained this at length  to her, and she understands and agrees to proceed. Thank you for this  consultation.      Thomas A. Cornett, M.D.  Electronically Signed     TAC/MEDQ  D:  07/19/2005  T:  07/20/2005  Job:  [id number]   cc:   [facility]   Lianne Bushy, M.D.  Fax: [phone]

## 2011-02-13 NOTE — Op Note (Signed)
[facility]  Patient:    Elizabeth Lamb, Elizabeth Lamb                     MRN: [id number] Proc. Date: 10/18/00 Adm. Date:  [id number] Attending:  Jenean Lindau CC:         Bertram Millard. Dahlstedt, M.D.   Operative Report  PREOPERATIVE DIAGNOSES: 1. Submucosal fibroids with menorrhagia and anemia. 2. Stress urinary incontinence.  POSTOPERATIVE DIAGNOSES: 1. Submucosal fibroids with menorrhagia and anemia. 2. Stress urinary incontinence.  PROCEDURES: 1. Transvaginal hysterectomy. 2. Pubovaginal sling.  SURGEONS: 1. Procedure #1--Lynn Kevin Fenton, M.D., Oswaldo Done, M.D. 2. Procedure #2--Stephen Retta Diones, M.D.  ANESTHESIA:  General endotracheal.  ESTIMATED BLOOD LOSS:  Less than 50 cc for the hysterectomy.  URINE OUTPUT:  150 cc for the hysterectomy.  COUNTS:  Correct x 2.  COMPLICATIONS:  None.  INDICATIONS FOR PROCEDURE:  Elizabeth Lamb is a 55 year old gravida 2, para 2 female who presented with severe worsening menorrhagia, anemia with a hemoglobin as low as 9 and was noted to have a fibroid uterus. On sonohysterogram, she was noted to have two submucosal fibroids. She was also complaining of stress urinary incontinence. She was evaluated by Dr. Retta Diones who felt that a pubovaginal sling was indicated. The patient was given alternatives including hormonal management versus oblation versus hysterectomy and elected to proceed with definitive surgical management to include the urologic procedure. She has seen the informed consent film, been counseled as to the risks, benefits, alternatives, and complications and agrees to proceed. She has received Ancef 1 gm IV antibiotic prophylaxis preoperatively.  DESCRIPTION OF PROCEDURE:  The patient was taken to the operating room and after proper identification and consents were ascertained, she was placed on the operating table in supine position. After the induction of general endotracheal  anesthesia, she was placed dorsal lithotomy position and the lower abdomen, perineum and vagina were prepped and draped in routine sterile fashion. A transurethral Foley was placed. A weighted speculum was placed in the posterior vagina and the cervix was grasped with a single tooth tenaculum with descent of the cervix through the introitus noted. The portio was then injected circumferentially with a 1:100,000 solution of epinephrine. The vaginal mucosa was then circumscribed with a scalpel around the cervix and was bluntly advanced off of the cervix using a finger and a  Raytec sponge. The posterior cul-de-sac was then tinted down using sharp pickups and the cul-de-sac was then entered without difficulty. The uterosacral ligaments were clamped, cut, heaney ligated and tagged bilaterally. The cardinal ligaments were similarly clamped and cut and suture ligated bilaterally with no pedicles held as there was a small vessel in each of these pedicles. At this point, the anterior peritoneal reflection was identified and entered sharply without obvious injury or entry into the bladder. The bladder was then retracted superiorly. The uterine vessels were then clamped bilaterally incorporating both anterior and posterior peritoneum with pedicles cut and suture ligated. The uterus was then flipped posteriorly. It was noted to be distorted by several serosal fibroids. Curved Heaney clamps were then placed across the proximal adnexal pedicles bilaterally with retention of tubes and ovaries. Detailed inspection of the right adnexa was performed with no evidence of a Falope ring anywhere on this pedicle and it was assumed that this Falope ring had been dislodged previously. This pedicle was cut and triply ligated with two free ties and a stitch of #0 Vicryl. The distal tube and ovary appeared completely  normal and was left in place as per a previous discussion with the patient. An identical procedure was  carried out on the right tube and ovary. However, on this side the Falope ring was identified and was removed. The pedicle was triply ligated with excellent hemostasis noted. The ovary and tube were within normal limits. At this point, there was a small amount of bleeding from the posterior cuff. The parietoperitoneum was closed in a pursestring suture fashion using 2-0 Vicryl suture. Counts were correct prior to closure of the peritoneum. The vaginal cuff was then closed from side to side using interrupted figure-of-eight sutures of #0 Vicryl. Hemostasis was noted to be excellent. At this point, blood loss was less than 50 cc, urine output 150 cc and counts were correct. Dr. Retta Diones presented at this point and proceeded to perform the pubovaginal sling as a separate procedure without an assistant. This will be dictated in a separate operative note. At the conclusion of the sling, Dr. Retta Diones will transfer the patient to the recovery room. DD:  10/18/00 TD:  10/18/00 Job: [id number]

## 2011-02-13 NOTE — Op Note (Signed)
[facility]  Patient:    Elizabeth Lamb, Elizabeth Lamb                     MRN: [id number] Proc. Date: 10/18/00 Adm. Date:  [id number] Attending:  Jenean Lindau CC:         Laqueta Linden, M.D.  Andres Ege, M.D.   Operative Report  PREOPERATIVE DIAGNOSIS:  Stress urinary incontinence.  POSTOPERATIVE DIAGNOSIS:  Stress urinary incontinence.  PRINCIPAL PROCEDURE:  Pubovaginal sling with cadaveric fascia lata.  SURGEON:  Lubertha Basque. Jerl Santos, M.D.  ANESTHESIA:  General endotracheal.  COMPLICATIONS:  None.  BRIEF HISTORY:  [age]-year-old female who was to undergo a vaginal hysterectomy for fibroids and menorrhagia as well as anemia.  She was referred by Dr. Andres Ege for evaluation of stress urinary incontinence.  This is quite troubling to this lady who is quite active with her job and outside work, needs to wear pads and desires to be dry.  Risks and complications of surgical procedures as well as nonsurgical alternatives were discussed with the patient.  She desires to proceed with surgical repair.  DESCRIPTION OF PROCEDURE:  The patient had vaginal hysterectomy performed by Dr. Laurena Bering and Dr. Laqueta Linden.  After the vaginal cuff was closed, the anterior vaginal wall was infiltrated with 1% lidocaine with epinephrine.  An incision from just proximal to the urethral meatus down to the bladder neck was carried out and dissection was carried out laterally on both sides of the vagina, allowing the vaginal mucosa to be dissected.  The pubic ramus was palpable bilaterally.  The fascia just medial to this was perforated with the scissors; access was then gained to the space of Retzius bilaterally.  The bladder was easily dissected off of the pubis inferiorly.  An incision had been made in the pubic area transversely and carried down to the rectus fascia with electrocautery.  The fascia lata tissue was then prepared; approximately 2 x 7-cm  section was used.  Helical sutures of 0 Prolene were placed bilaterally.  The Raz needle was then passed bilaterally through the pubic incision, grasping the separate ends of the Prolene and drawn up.  This allowed the strip of fascial lata to settle right into the area of the bladder neck and proximal urethra.  It was tacked with 4-0 Vicryl at the bladder neck and mid-urethral areas.  Minimal tension was used to draw the separate ends of the fascial lata strip up in the retropubic area.  They were then tied without any significant tension.  The bladder was inspected cystoscopically and found to be free of injury.  Using minimal tension on the sutures, there was excellent coaptation of the proximal urethra.  The sutures were tied bilaterally and then across the midline to themselves.  Inspection was again carried out at the bladder neck and there was no real closure of the bladder neck with the sutures tied.  The bladder was filled with saline and a Bonnano suprapubic tube passed through the skin; good curl was seen of the Bonnano catheter in the bladder.  The vaginal mucosa was closed with a running 2-0 Vicryl.  A vaginal pack was placed with Estrace cream.  The suprapubic tube was placed to dependent drainage after it was sutured to the skin with 3-0 nylons.  Dry sterile dressings were placed.  Estimated blood loss was approximately 50 to 100 cc.  The patient tolerated the procedure well, was extubated and taken to the  PACU in stable condition. DD:  10/18/00 TD:  10/18/00 Job: [id number]

## 2011-02-13 NOTE — H&P (Signed)
[facility]  Patient:    Elizabeth Lamb, Elizabeth Lamb                     MRN: [id number] Adm. Date:  [id number] Attending:  Jenean Lindau                         History and Physical  IDENTIFYING DATA:  The patient is a 55 year old female with submucosal fibroids with menorrhagia and anemia as well as urinary incontinence admitted for vaginal hysterectomy and pubovaginal sling.  HISTORY OF PRESENT ILLNESS:  The patient is a 55 year old, gravida 2, para 2, married white female, perimenopausal, who presents with gradually worsening dysmenorrhea and menorrhagia which has become more difficult to control over the last several years.  She is noted to have small fibroids on examination. Symptoms were initially controlled with Aleve.  She subsequently was tried on continuous Micronor and, then, Megace with continued abnormal bleeding.  She underwent pelvic ultrasound with sonohistogram revealing several cervical type fibroids and two endometrial defects consistent with small submucosal fibroids.  In addition, she had complaints of urinary incontinence and was evaluated by Dr. Retta Diones who felt that a pubovaginal sling was indicated if she underwent vaginal surgery.  Endometrial sampling was benign proliferative endometrium.  Patient was given multiple options including continued medical management versus ablation versus resection versus hysterectomy and elected to proceed with definitive surgical management with a urologic procedure at the same time.  She is now admitted for same-day surgery.  She has been extensively counseled by above surgeons regarding risks, benefits, alternatives, and complications, and agrees to proceed.  PAST MEDICAL HISTORY:  Medical:  1. Past history of microhematuria with negative workup.  2. Anemia due to menorrhagia.  Surgical:  1. Tooth extraction at the age of 11.  2. Tonsillectomy at the age of 72.  3. 2, laparoscopic tubal ligation.   4. 9/90, lumbar laminectomy per Dr. Roxan Hockey. 5. 9/00 ultrasound-directed breast biopsy for benign fibroadenoma.  OBSTETRICAL HISTORY:  Vaginal delivery x 2 without complications.  Tubal ligation as noted above.  GYNECOLOGIC HISTORY:  As noted above.  Pap smear in 1/02 within normal limits. Last mammogram in 10/01 was within normal limits.  ALLERGIES:  DOXYCYCLINE and VIBRAMYCIN cause a rash.  CURRENT MEDICATIONS: 1. Megace 40 mg daily. 2. Aleve p.r.n. with menses. 3. Vitamin E. 4. Iron supplementation. 5. Calcium supplementation. 6. Valtrex p.r.n.  Of note, the patient stopped her Aleve and vitamin E prior to surgery.  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  She has two grown children.  She previously smoked one pack per day for 10 years but has quit for approximately two months.  She drinks alcohol occasionally but denies illicit drug use.  FAMILY HISTORY:  Positive for pancreatic cancer in her father who is deceased at the age of 10 who also had hypertension.  Mother with diabetes. Grandparents with heart disease and kidney stones.  REVIEW OF SYSTEMS:  Within normal limits except for the history of present illness.  Of note, she reports that she can feel her right fallopian ring whenever she has an exam and specifically requests that this be removed.  PHYSICAL EXAMINATION PRIOR TO ADMISSION:  GENERAL:  A healthy white female in no apparent distress.  VITAL SIGNS:  Height 5 feet, 5 inches, weight 141 pounds.  Blood pressure 96/60, pulse 60, respirations 16, temperature 98.1.  HEENT:  Grossly  negative.  Oropharynx clear.  NECK:  Supple without thyromegaly or lymphadenopathy.  CHEST:  Without spine or CVA tenderness.  BREASTS:  Without masses.  HEART:  Regular rate and rhythm without murmurs, gallops, or rubs. Carotids +2 and equal without bruits.  Distal pulses full.  ABDOMEN:  Soft, nontender, without  hepatosplenomegaly.  PELVIC:  Exam revealed clean vagina and cervix with moderate cervical uterine descent noted.  Uterus slightly irregular anterior and mobile.  Adnexa without obvious masses.  RECTOVAGINAL:  Confirmatory.  EXTREMITIES:  Without edema.  NEUROLOGIC:  Grossly nonfocal.  LABORATORY STUDIES ON ADMISSION:  Urinalysis with blood consistent with patients spotting.  Hemoglobin 9.8, hematocrit 30.4, white count 6.6, platelets 347.  Coagulation studies normal.  Chest x-ray:  No active disease.  EKG:  Deferred per anesthesia.  IMPRESSION ON ADMISSION: 1. Cervical and submucosal fibroids with menorrhagia and anemia. 2. Stress urinary incontinence. 3. Prior smoker.  PLAN:  The patient is admitted for same-day surgery.  She will undergo vaginal hysterectomy with pubovaginal sling per Dr. Retta Diones.  She elects for ovarian conservation if her ovaries appear normal having been counseled as to the risks, benefits, and alternatives of elective removal versus retention. Specifically, the right and both if technically possible Falope-rings will be removed at time of surgery.  She has been counseled as to postoperative expectations regarding urinary function, sexual function, and return to normal activity and agrees to proceed. DD:  10/18/00 TD:  10/18/00 Job: [id number]

## 2011-02-13 NOTE — H&P (Signed)
Elizabeth Lamb, Elizabeth Lamb NO.:  [id number]   MEDICAL RECORD NO.:  [id number]          PATIENT TYPE:  EMS   LOCATION:  MAJO                         FACILITY:  [facility]   PHYSICIAN:  Hillery Aldo, M.D.   DATE OF BIRTH:  05-04-1956   DATE OF ADMISSION:  07/16/2005  DATE OF DISCHARGE:                                HISTORY & PHYSICAL   PRIMARY CARE PHYSICIAN:  Lianne Bushy, M.D. of [place].   CHIEF COMPLAINT:  Nausea and vomiting accompanied by diarrhea for the past  24 hours.   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  Her  symptoms originally began with diarrhea at 9 a.m. yesterday morning and this  progressed to intractable nausea and vomiting.  Denies any hematemesis,  melena, or hematochezia.  She is having crampy abdominal pain that is  moderate in intensity.  The patient reports that several coworkers have been  out sick with similar symptoms.  She was admitted secondary to hypotension  which persisted after being given 3 liters of normal saline.   ALLERGIES:  VIBRAMYCIN, DOXYCYCLINE cause rash.   CURRENT MEDICATIONS:  Multivitamin daily.   PAST MEDICAL HISTORY:  1.  Migraine headaches.  2.  History of lumbar laminectomy.  3.  History of right rotator cuff surgical repair.  4.  History of hysterectomy secondary to dysfunctional uterine bleeding.  5.  History of anemia secondary to dysfunctional uterine bleeding.  6.  History of stress urinary incontinence.  7.  Hospitalized in October of 2002 secondary to mental status changes of      undetermined etiology, but the patient was hyponatremic and hypokalemic      during that admission.  8.  History of microhematuria with a negative workup.  9.  History of tonsillectomy in childhood.  10. History of laparoscopic tubal  ligation.   FAMILY HISTORY:  The patient's mother is deceased of unknown causes, but  suffered with diabetes, cerebrovascular disease, hypertension, and  congestive heart failure.  The patient's father is deceased at age 16  secondary to pancreatic cancer.   SOCIAL HISTORY:  The patient is married.  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  She has a sporadic history of occasional  tobacco and alcohol.  No drugs.   REVIEW OF SYSTEMS:  Difficult to obtain secondary to lethargy status post  Phenergan.  Denies any subjective fever or chills.  Does have some body  aches.   PHYSICAL EXAMINATION:  VITAL SIGNS:  Temperature 100.9, pulse 96,  respirations 20, blood pressure 75/43, O2 saturation 98% on room air.  GENERAL:  Well-developed, well-nourished, female who is lethargic and  confused when aroused.  HEENT:  Normocephalic and atraumatic.  PERRL.  EOMI.  Oropharynx reveals  extremely dry mucous membranes.  NECK:  Supple.  No thyromegaly, no lymphadenopathy, no jugular venous  distention.  CHEST:  Lungs clear to auscultation bilaterally  with good air movement.  HEART:  Regular rate and rhythm.  No murmurs, rubs, or gallops.  ABDOMEN:  Soft, nontender, and nondistended.  Normal active bowel sounds.  RECTAL:  Heme negative stool and normal rectal tone.  EXTREMITIES:  No cyanosis, clubbing, or edema.  SKIN:  No rashes.  Hot to touch.   LABORATORY DATA:  CBC showed a white blood cell count of 7.4, hemoglobin  12.8, hematocrit 38.3, platelets 207.  Sodium 135, potassium 4.4, chloride  109, bicarb 20, BUN 17, creatinine 0.9, glucose 90.   ASSESSMENT:  1.  Dehydration secondary to gastroenteritis versus infectious diarrhea.  We      will admit the patient to a regular bed and place her on contact      isolation in the event that she may have contracted Clostridium      difficile given her occupation as a Engineer, civil (consulting).  We will administer IV fluids      at 150 mL an  hour.  Notably, her blood pressure tends to run low, but      will do frequent vital signs given her current hypotension.  We will      check stool studies for Clostridium difficile, cultures, and fecal      leukocytes.  We will administer antiemetics p.r.n.  We will use Toradol      for abdominal pain.  2.  Altered mental status.  The patient has a history of altered mental      status in the past, but her electrolytes are normal.  I suspect this is      just a side effect of being treated with Phenergan.  We will continue to      monitor closely.  3.  Prophylaxis.  We will initiate DVT and GI prophylaxis.           ______________________________  Hillery Aldo, M.D.     CR/MEDQ  D:  07/16/2005  T:  07/17/2005  Job:  [id number]   cc:   Lianne Bushy, M.D.  Fax: [phone]

## 2011-06-26 LAB — URINALYSIS, ROUTINE W REFLEX MICROSCOPIC
Bilirubin Urine: NEGATIVE
Glucose, UA: NEGATIVE
Ketones, ur: NEGATIVE
Nitrite: NEGATIVE
Protein, ur: NEGATIVE
Specific Gravity, Urine: 1.019
Urobilinogen, UA: 1
pH: 6

## 2011-06-26 LAB — COMPREHENSIVE METABOLIC PANEL
ALT: 26
AST: 63 — ABNORMAL HIGH
Albumin: 4.3
Alkaline Phosphatase: 62
BUN: 11
CO2: 29
Calcium: 9.7
Chloride: 99
Creatinine, Ser: 0.6
GFR calc Af Amer: >60
GFR calc non Af Amer: >60
Glucose, Bld: 75
Potassium: 3.8
Sodium: 136
Total Bilirubin: 0.3
Total Protein: 7.1

## 2011-06-26 LAB — POCT CARDIAC MARKERS
CKMB, poc: 1.2
CKMB, poc: 3.5
Myoglobin, poc: 45.8
Myoglobin, poc: 72.5
Troponin i, poc: <0.05
Troponin i, poc: <0.05

## 2011-06-26 LAB — CBC
HCT: 35.5 — ABNORMAL LOW
Hemoglobin: 12.5
MCHC: 35.2
MCV: 83.1
Platelets: 245
RBC: 4.27
RDW: 12.9
WBC: 7.2

## 2011-06-26 LAB — DIFFERENTIAL
Basophils Absolute: 0
Basophils Relative: 1
Eosinophils Absolute: 0.4
Eosinophils Relative: 5
Lymphocytes Relative: 48 — ABNORMAL HIGH
Lymphs Abs: 3.5
Monocytes Absolute: 0.3
Monocytes Relative: 5
Neutro Abs: 3
Neutrophils Relative %: 42 — ABNORMAL LOW

## 2011-06-26 LAB — URINE MICROSCOPIC-ADD ON

## 2011-06-26 LAB — D-DIMER, QUANTITATIVE: D-Dimer, Quant: 0.62 — ABNORMAL HIGH

## 2011-06-26 LAB — LIPASE, BLOOD: Lipase: 237

## 2011-06-30 LAB — COMPREHENSIVE METABOLIC PANEL
ALT: 23
ALT: 27
AST: 49 — ABNORMAL HIGH
AST: 60 — ABNORMAL HIGH
Albumin: 3.2 — ABNORMAL LOW
Albumin: 3.8
Alkaline Phosphatase: 41
Alkaline Phosphatase: 52
BUN: 5 — ABNORMAL LOW
BUN: 6
CO2: 22
CO2: 25
Calcium: 8.3 — ABNORMAL LOW
Calcium: 9.1
Chloride: 88 — ABNORMAL LOW
Chloride: 95 — ABNORMAL LOW
Creatinine, Ser: 0.4
Creatinine, Ser: 0.53
GFR calc Af Amer: >60
GFR calc Af Amer: >60
GFR calc non Af Amer: >60
GFR calc non Af Amer: >60
Glucose, Bld: 71
Glucose, Bld: 78
Potassium: 3.6
Potassium: 3.7
Sodium: 121 — ABNORMAL LOW
Sodium: 124 — ABNORMAL LOW
Total Bilirubin: 0.6
Total Bilirubin: 0.6
Total Protein: 5.2 — ABNORMAL LOW
Total Protein: 6.2

## 2011-06-30 LAB — ETHANOL: Alcohol, Ethyl (B): <5

## 2011-06-30 LAB — OSMOLALITY, URINE: Osmolality, Ur: 327 — ABNORMAL LOW

## 2011-06-30 LAB — BASIC METABOLIC PANEL
BUN: 1 — ABNORMAL LOW
BUN: 5 — ABNORMAL LOW
CO2: 21
CO2: 25
Calcium: 8.9
Calcium: 8.9
Chloride: 104
Chloride: 99
Creatinine, Ser: 0.41
Creatinine, Ser: 0.49
GFR calc Af Amer: >60
GFR calc Af Amer: >60
GFR calc non Af Amer: >60
GFR calc non Af Amer: >60
Glucose, Bld: 124 — ABNORMAL HIGH
Glucose, Bld: 154 — ABNORMAL HIGH
Potassium: 3.4 — ABNORMAL LOW
Potassium: 3.7
Sodium: 130 — ABNORMAL LOW
Sodium: 134 — ABNORMAL LOW

## 2011-06-30 LAB — CARDIAC PANEL(CRET KIN+CKTOT+MB+TROPI)
CK, MB: 4
CK, MB: 8.1 — ABNORMAL HIGH
Relative Index: 1.6
Relative Index: 2.5
Total CK: 248 — ABNORMAL HIGH
Total CK: 324 — ABNORMAL HIGH
Troponin I: 0.01
Troponin I: 0.03

## 2011-06-30 LAB — URINALYSIS, ROUTINE W REFLEX MICROSCOPIC
Bilirubin Urine: NEGATIVE
Glucose, UA: NEGATIVE
Ketones, ur: 40 — AB
Nitrite: NEGATIVE
Protein, ur: NEGATIVE
Specific Gravity, Urine: 1.012
Urobilinogen, UA: 0.2
pH: 5.5

## 2011-06-30 LAB — CBC
HCT: 35.2 — ABNORMAL LOW
Hemoglobin: 11.8 — ABNORMAL LOW
MCHC: 33.5
MCV: 85.8
Platelets: 205
RBC: 4.11
RDW: 13.5
WBC: 8.1

## 2011-06-30 LAB — VITAMIN B12: Vitamin B-12: 799 (ref 211–911)

## 2011-06-30 LAB — T4, FREE: Free T4: 1.23

## 2011-06-30 LAB — DIFFERENTIAL
Basophils Absolute: 0
Basophils Relative: 1
Eosinophils Absolute: 0.2
Eosinophils Relative: 3
Lymphocytes Relative: 35
Lymphs Abs: 2.9
Monocytes Absolute: 0.5
Monocytes Relative: 6
Neutro Abs: 4.5
Neutrophils Relative %: 56

## 2011-06-30 LAB — RAPID URINE DRUG SCREEN, HOSP PERFORMED
Amphetamines: NOT DETECTED
Barbiturates: NOT DETECTED
Benzodiazepines: NOT DETECTED
Cocaine: NOT DETECTED
Opiates: NOT DETECTED
Tetrahydrocannabinol: NOT DETECTED

## 2011-06-30 LAB — URINE MICROSCOPIC-ADD ON

## 2011-06-30 LAB — B-NATRIURETIC PEPTIDE (CONVERTED LAB)
Pro B Natriuretic peptide (BNP): 246 — ABNORMAL HIGH
Pro B Natriuretic peptide (BNP): 327 — ABNORMAL HIGH
Pro B Natriuretic peptide (BNP): <30

## 2011-06-30 LAB — FOLATE: Folate: >20

## 2011-06-30 LAB — RPR: RPR Ser Ql: NONREACTIVE

## 2011-06-30 LAB — ACTH STIMULATION, 3 TIME POINTS
Cortisol, 30 Min: 5.1 (ref >20)
Cortisol, 60 Min: 5.6 (ref >20)
Cortisol, Base: 4.2

## 2011-06-30 LAB — ACTH: C206 ACTH: 9 — ABNORMAL LOW

## 2011-06-30 LAB — TROPONIN I: Troponin I: 0.01

## 2011-06-30 LAB — TSH: TSH: 3.98

## 2011-06-30 LAB — CORTISOL
Cortisol, Plasma: 0.8
Cortisol, Plasma: 1.1

## 2011-06-30 LAB — PHOSPHORUS: Phosphorus: 3.9

## 2011-06-30 LAB — MAGNESIUM: Magnesium: 1.6

## 2011-06-30 LAB — OSMOLALITY
Osmolality: 247 — ABNORMAL LOW
Osmolality: 254 — ABNORMAL LOW

## 2011-06-30 LAB — FOLLICLE STIMULATING HORMONE: FSH: 45.7

## 2011-06-30 LAB — CK TOTAL AND CKMB (NOT AT ARMC)
CK, MB: 4.2 — ABNORMAL HIGH
Relative Index: 1.4
Total CK: 300 — ABNORMAL HIGH

## 2011-06-30 LAB — SODIUM, URINE, RANDOM: Sodium, Ur: 21

## 2013-01-27 ENCOUNTER — Ambulatory Visit (INDEPENDENT_AMBULATORY_CARE_PROVIDER_SITE_OTHER): Payer: Managed Care, Other (non HMO) | Admitting: Family Medicine

## 2013-01-27 VITALS — BP 98/50 | HR 65 | Temp 98.7°F | Resp 16

## 2013-01-27 DIAGNOSIS — Z111 Encounter for screening for respiratory tuberculosis: Secondary | ICD-10-CM

## 2013-01-27 DIAGNOSIS — S70361A Insect bite (nonvenomous), right thigh, initial encounter: Secondary | ICD-10-CM

## 2013-01-27 DIAGNOSIS — S90569A Insect bite (nonvenomous), unspecified ankle, initial encounter: Secondary | ICD-10-CM

## 2013-01-27 DIAGNOSIS — W57XXXA Bitten or stung by nonvenomous insect and other nonvenomous arthropods, initial encounter: Secondary | ICD-10-CM

## 2013-01-27 MED ORDER — AMOXICILLIN 500 MG PO CAPS: 500.0000 mg | ORAL_CAPSULE | Freq: Three times a day (TID) | ORAL | Status: DC

## 2013-01-27 NOTE — Progress Notes (Signed)
  Subjective:    Patient ID: Elizabeth Lamb, female    DOB: 02/14/1956, 57 y.o.   MRN: [id number]  HPI  Tick removed from inside of right upper thigh 1 week ago.  Small tick, and unknown how long it had been there. Removed head.  Noticed redness and itching in area 4 days ago, and notes swelling and more redness since then.   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  Needs ppd. No prior positives see nursing note.   Root canal 10 days ago, taking amoxicillin 250 mg qd - 7 more days.   Review of Systems  Constitutional: Negative for fever, chills and fatigue.  Gastrointestinal: Negative for abdominal pain.  Skin: Negative for rash.  Neurological: Negative for headaches.       Objective:   Physical Exam  Nursing note and vitals reviewed. Constitutional: She is oriented to person, place, and time. She appears well-developed and well-nourished. No distress.  Pulmonary/Chest: Effort normal.  Neurological: She is alert and oriented to person, place, and time.  Skin: Skin is warm. Lesion noted. No rash noted.     Psychiatric: She has a normal mood and affect. Her behavior is normal.        Assessment & Plan:  Elizabeth Lamb is a 57 y.o. female Screening for tuberculosis - Plan: TB Skin Test  Tick bite of thigh with local reaction, right, initial encounter - Plan: amoxicillin (AMOXIL) 500 MG capsule  Tick bite with local reaction - increased swelling by hx.  Does not appear ot be erythema migrans and otherwise asx, but unknown duration of tick attachment.  Will start amox at higher dose than used after root canal, and rtc precautions.   Ppd placed as will be in nursing care at correctional facility.   Meds ordered this encounter  Medications  .       .       . amoxicillin (AMOXIL) 500 MG capsule    Sig: Take 1 capsule (500 mg total) by mouth 3 (three) times daily.    Dispense:  30 capsule    Refill:  0     Patient Instructions  Can start higher dose of amoxicillin  for local reaction from tick bite. Return to the clinic or go to the nearest emergency room if any of your symptoms worsen or new symptoms occur.     also noted that husband had redness in area of tick bite - advised that he should also be seen.

## 2013-01-27 NOTE — Patient Instructions (Addendum)
Can start higher dose of amoxicillin for local reaction from tick bite. Return to the clinic or go to the nearest emergency room if any of your symptoms worsen or new symptoms occur.

## 2013-01-27 NOTE — Progress Notes (Signed)
  Subjective:    Patient ID: Elizabeth Lamb, female    DOB: July 16, 1956, 57 y.o.   MRN: [id number]  HPI    Review of Systems     Objective:   Physical Exam     Tuberculosis Risk Questionnaire  1. Were you born outside the [place] in one of the following parts of the world:    [place], [place], [place], [place] or [place]?  No  2. Have you traveled outside the [place] and lived for more than one month in one of the following parts of the world:  [place], [place], [place], [place] or [place]?  No  3. Do you have a compromised immune system such as from any of the following conditions:  HIV/AIDS, organ or bone marrow transplantation, diabetes, immunosuppressive   medicines (e.g. Prednisone, Remicaide), leukemia, lymphoma, cancer of the head or neck, gastrectomy or jejunal bypass, end-stage renal disease (on dialysis), or silicosis?  No    4. Have you ever done one of the following:    Used crack cocaine, injected illegal drugs, worked or resided in jail or prison, worked or resided at a homeless shelter, or worked as a Research scientist (physical sciences) in direct contact with patients?  Yes-direct contact with patients (next week she will be working in the [employer])   5. Have you ever been exposed to anyone with infectious tuberculosis?  No   Tuberculosis Symptom Questionnaire  Do you currently have any of the following symptoms?  1. Unexplained cough lasting more than 3 weeks? No  Unexplained fever lasting more than 3 weeks. No   3. Night Sweats (sweating that leaves the bedclothes and sheets wet)   No  4. Shortness of Breath No  5. Chest Pain No  6. Unintentional weight loss  No  7. Unexplained fatigue (very tired for no reason) No      Assessment & Plan:

## 2013-01-31 ENCOUNTER — Ambulatory Visit (INDEPENDENT_AMBULATORY_CARE_PROVIDER_SITE_OTHER): Payer: Managed Care, Other (non HMO) | Admitting: Family Medicine

## 2013-01-31 DIAGNOSIS — Z111 Encounter for screening for respiratory tuberculosis: Secondary | ICD-10-CM

## 2013-01-31 LAB — TB SKIN TEST: TB Skin Test: NEGATIVE

## 2013-04-19 ENCOUNTER — Other Ambulatory Visit: Payer: Self-pay | Admitting: *Deleted

## 2013-04-19 MED ORDER — HYDROCORTISONE 10 MG PO TABS: ORAL_TABLET | ORAL | Status: DC

## 2013-05-17 ENCOUNTER — Encounter: Payer: Self-pay | Admitting: Endocrinology

## 2013-05-17 DIAGNOSIS — E2749 Other adrenocortical insufficiency: Secondary | ICD-10-CM | POA: Insufficient documentation

## 2013-05-18 ENCOUNTER — Other Ambulatory Visit: Payer: Self-pay | Admitting: *Deleted

## 2013-05-18 DIAGNOSIS — E2749 Other adrenocortical insufficiency: Secondary | ICD-10-CM

## 2013-05-22 ENCOUNTER — Other Ambulatory Visit: Payer: Self-pay

## 2013-08-11 ENCOUNTER — Telehealth: Payer: Self-pay

## 2013-08-11 NOTE — Telephone Encounter (Signed)
Yes she needs labs 3 days before her visit

## 2013-08-11 NOTE — Telephone Encounter (Signed)
The patient called and was a previous patient at [facility] with Dr.Kumar. She is hoping to re-est with Dr.Kumar here at [facility]  She has made an appointment for the 26th (of Nov).  Do you want her to have her labs done before the apt?

## 2013-08-14 ENCOUNTER — Other Ambulatory Visit: Payer: Self-pay | Admitting: *Deleted

## 2013-08-14 NOTE — Telephone Encounter (Signed)
Tried calling patient to schedule lab apt, each number was disconnected.  Will try calling soon.

## 2013-08-17 ENCOUNTER — Other Ambulatory Visit: Payer: Self-pay

## 2013-08-23 ENCOUNTER — Ambulatory Visit: Payer: Self-pay | Admitting: Endocrinology

## 2013-08-23 DIAGNOSIS — Z0289 Encounter for other administrative examinations: Secondary | ICD-10-CM

## 2013-09-25 ENCOUNTER — Ambulatory Visit: Payer: Self-pay | Admitting: Endocrinology

## 2013-09-25 ENCOUNTER — Other Ambulatory Visit: Payer: Self-pay

## 2013-09-26 ENCOUNTER — Other Ambulatory Visit (INDEPENDENT_AMBULATORY_CARE_PROVIDER_SITE_OTHER): Payer: Self-pay

## 2013-09-26 DIAGNOSIS — E2749 Other adrenocortical insufficiency: Secondary | ICD-10-CM

## 2013-09-26 LAB — BASIC METABOLIC PANEL
BUN: 16 mg/dL (ref 6–23)
CO2: 28 mEq/L (ref 19–32)
Calcium: 9.7 mg/dL (ref 8.4–10.5)
Chloride: 102 mEq/L (ref 96–112)
Creatinine, Ser: 0.7 mg/dL (ref 0.4–1.2)
GFR: 99.2 mL/min (ref >60.00)
Glucose, Bld: 90 mg/dL (ref 70–99)
Potassium: 3.6 mEq/L (ref 3.5–5.1)
Sodium: 137 mEq/L (ref 135–145)

## 2013-09-26 LAB — T4, FREE: Free T4: 0.87 ng/dL (ref 0.60–1.60)

## 2013-09-27 ENCOUNTER — Ambulatory Visit: Payer: Self-pay | Admitting: Endocrinology

## 2013-09-29 ENCOUNTER — Other Ambulatory Visit: Payer: Self-pay | Admitting: *Deleted

## 2013-09-29 ENCOUNTER — Ambulatory Visit (INDEPENDENT_AMBULATORY_CARE_PROVIDER_SITE_OTHER): Payer: Self-pay | Admitting: Endocrinology

## 2013-09-29 ENCOUNTER — Encounter: Payer: Self-pay | Admitting: Endocrinology

## 2013-09-29 VITALS — BP 110/70 | HR 69 | Temp 97.4°F | Ht 63.16 in | Wt 141.0 lb

## 2013-09-29 DIAGNOSIS — E039 Hypothyroidism, unspecified: Secondary | ICD-10-CM | POA: Insufficient documentation

## 2013-09-29 DIAGNOSIS — E2749 Other adrenocortical insufficiency: Secondary | ICD-10-CM

## 2013-09-29 LAB — TSH: TSH: 0.7 u[IU]/mL (ref 0.35–5.50)

## 2013-09-29 NOTE — Progress Notes (Signed)
Elizabeth Lamb  Chief complaint: Followup of adrenal insufficiency and thyroid    History of Present Illness:   1. Patient has had secondary adrenal insufficiency since 07/2008 when she was admitted to the hospital with a sodium of 121. She also has a partial empty sella but no other pituitary hormone deficiencies She is here for her followup, previously seen in 01/2013  On her last visit she had run out of her Cortef and her blood pressure was relatively low, standing reading 82/58 Surprising she was was asymptomatic at that time although she had lost weight which she believed was from changing her diet and lifestyle She has now been taking her Cortef quite regularly, 20 mg in the morning and 10 mg in evening usually However because of working night shifts she found that taking the second dose of hydrocortisone would interfere with her sleep and is taking 30 mg of Cortef together  at about 10 PM before going to work. Currently working 5 days a week She denies any decreased appetite, nausea, weight loss, lightheadedness or weakness  2. Primary hypothyroidism: On her last visit she had a TSH level of 5.9 check incidentally. Free T4 has been consistently normal previously She was not complaining of unusual fatigue, cold intolerance, hair loss or dry skin She does have family history of hypothyroidism The patient wanted to empirically try thyroid supplement in 5/14 and has been taking 25 mcg daily of levothyroxine since then However has not followed up until now With this dose she thinks her energy level improved significantly and she now does  not feel unusual fatigue or cold intolerance. Has been quite regular with taking her medication on waking up daily   Filed Weights   09/29/13 1421  Weight: 141 lb (63.957 kg)    Office Visit on 09/29/2013  Component Date Value Range Status  . TSH 09/29/2013 0.70  0.35 - 5.50 uIU/mL Final  Appointment on 09/26/2013  Component Date Value Range  Status  . Sodium 09/26/2013 137  135 - 145 mEq/L Final  . Potassium 09/26/2013 3.6  3.5 - 5.1 mEq/L Final  . Chloride 09/26/2013 102  96 - 112 mEq/L Final  . CO2 09/26/2013 28  19 - 32 mEq/L Final  . Glucose, Bld 09/26/2013 90  70 - 99 mg/dL Final  . BUN 57/84/6962 16  6 - 23 mg/dL Final  . Creatinine, Ser 09/26/2013 0.7  0.4 - 1.2 mg/dL Final  . Calcium 95/28/4132 9.7  8.4 - 10.5 mg/dL Final  . GFR 44/09/270 99.20  >60.00 mL/min Final  . Free T4 09/26/2013 0.87  0.60 - 1.60 ng/dL Final      Medication List       This list is accurate as of: 09/29/13 11:59 PM.  Always use your most recent med list.               hydrocortisone 10 MG tablet  Commonly known as:  CORTEF  Take 2 tablets by mouth every morning and 1 tablet at night.     levothyroxine 25 MCG tablet  Commonly known as:  SYNTHROID, LEVOTHROID  25 mcg.        Allergies:  Allergies  Allergen Reactions  . Vibramycin [Doxycycline Calcium] Rash    Past Medical History  Diagnosis Date  . Allergy   . Hypoadrenalism   . Pituitary abnormality     Past Surgical History  Procedure Laterality Date  . Cholecystectomy    . Tubal ligation    .  Tonsillectomy    . Abdominal hysterectomy      Partial    Family History  Problem Relation Age of Onset  . Congestive Heart Failure Mother   . Thyroid disease Mother   . Cancer Father     pancreatic  . Diabetes Maternal Grandmother     Social History:  reports that she has been smoking.  She does not have any smokeless tobacco history on file. She reports that she drinks alcohol. She reports that she does not use illicit drugs.  She is working as a Sports administratoroccupational health nurse  REVIEW Of SYSTEMS:  She has a previous history of migraine headaches, taking Relpax as needed No history of hypertension or diabetes  She had been previously advised to take vitamin D 1000 units empirically for supplementation and osteoporosis prevention. Taking only a multivitamin  currently   Examination:   BP 110/70  Pulse 69  Temp(Src) 97.4 F (36.3 C) (Oral)  Ht 5' 3.16" (1.604 m)  Wt 141 lb (63.957 kg)  BMI 24.86 kg/m2  SpO2 98%  She does not look pale, well built and nourished  Skin is not unusually dry. Hands are warm Neck exam shows no goiter  NEUROLOGIC EXAM:  biceps reflexes show normal-brisk relaxation  Assessment/Plan:   1. Secondary adrenal insufficiency, long-standing and stable. Currently asymptomatic and doing well with full replacement doses of Cortef. Discussed that she does need to spread out her dosage during the day even when she is working night shifts. She will try to take her second 10 mg dose about 4 hours prior to her planned bedtime and take 20 mg on waking up. She needs to continue taking the supplements lifelong. Since she does not have any orthostasis will not consider Florinef. Sodium is normal 2.  Probable primary hypothyroidism without a goiter. She has done well with low-dose supplementation of 25 mcg and will continue. Discussed taking the medication on empty stomach without any calcium or iron supplements TSH pending 3. History of migraine headaches, see his neurologist as needed 4. Vitamin D has been recommended at least 400 units for general preventative care   [facility] ChildrenKUMAR,Hodge Stachnik 10/01/2013, 11:23 AM    Addendum: TSH normal, no change needed

## 2013-09-30 NOTE — Progress Notes (Signed)
Quick Note:  Please let patient know that the lab result is normal and no change needed ______ 

## 2013-10-01 ENCOUNTER — Encounter: Payer: Self-pay | Admitting: Endocrinology

## 2013-10-02 ENCOUNTER — Encounter: Payer: Self-pay | Admitting: *Deleted

## 2013-11-17 ENCOUNTER — Other Ambulatory Visit: Payer: Self-pay | Admitting: *Deleted

## 2013-11-17 MED ORDER — HYDROCORTISONE 10 MG PO TABS: ORAL_TABLET | ORAL | Status: DC

## 2013-11-17 MED ORDER — LEVOTHYROXINE SODIUM 25 MCG PO TABS: 25.0000 ug | ORAL_TABLET | Freq: Every day | ORAL | Status: DC

## 2014-02-15 ENCOUNTER — Other Ambulatory Visit: Payer: Self-pay | Admitting: Neurology

## 2014-02-16 ENCOUNTER — Telehealth: Payer: Self-pay | Admitting: Neurology

## 2014-02-16 NOTE — Telephone Encounter (Signed)
Patient has not been seen since 2012.  I called back.  Got no answer.  Left message.

## 2014-02-16 NOTE — Telephone Encounter (Signed)
Patient calling wanting a refill of her Relpax called into [facility] [phone].

## 2014-02-23 ENCOUNTER — Telehealth: Payer: Self-pay | Admitting: Neurology

## 2014-02-23 ENCOUNTER — Other Ambulatory Visit: Payer: Self-pay | Admitting: *Deleted

## 2014-02-23 ENCOUNTER — Telehealth: Payer: Self-pay | Admitting: Endocrinology

## 2014-02-23 MED ORDER — ELETRIPTAN HYDROBROMIDE 40 MG PO TABS: 40.0000 mg | ORAL_TABLET | ORAL | Status: DC | PRN

## 2014-02-23 NOTE — Telephone Encounter (Signed)
faxed

## 2014-02-23 NOTE — Telephone Encounter (Signed)
Checking with Aundra Millet to see if she can see this patient. Left message for patient to call back and schedule.

## 2014-02-23 NOTE — Telephone Encounter (Signed)
Patient would like Dr. Lucianne Muss to prescribe her Elizabeth Lamb  She has an appt to see Dr. Lucianne Muss in July  Please advise patient    Thank You

## 2014-02-23 NOTE — Telephone Encounter (Signed)
Patient has migraines and wants to know if you will prescribe relpax for her.  She said you had filled it for her 2 times in 2014.  She said Dr. Anne Hahn told her back in 2012 that she did not need to see him and that you should prescribe it for her.  Patient is very upset that the rx was denied Tuesday.  Please advise.

## 2014-02-23 NOTE — Telephone Encounter (Signed)
Patient was last seen on 08-25-11 for migraines--patient was given Rx for Relpax and was told to come back as needed--she is still having migraines and needs Relpax called in--[facility]--she needs an appointment scheduled but Dr. Anne Hahn does not have any soon appointments to schedule-- patient goes to Dr. Lucianne Muss but he will not call in the Rx--please call.

## 2014-02-23 NOTE — Telephone Encounter (Signed)
Ok to rx

## 2014-04-06 ENCOUNTER — Other Ambulatory Visit: Payer: Self-pay

## 2014-04-10 ENCOUNTER — Ambulatory Visit: Payer: Self-pay | Admitting: Endocrinology

## 2014-04-17 ENCOUNTER — Other Ambulatory Visit: Payer: Self-pay

## 2014-04-20 ENCOUNTER — Ambulatory Visit: Payer: Self-pay | Admitting: Endocrinology

## 2014-04-24 ENCOUNTER — Other Ambulatory Visit: Payer: Self-pay | Admitting: *Deleted

## 2014-04-24 MED ORDER — LEVOTHYROXINE SODIUM 25 MCG PO TABS: 25.0000 ug | ORAL_TABLET | Freq: Every day | ORAL | Status: DC

## 2014-05-02 ENCOUNTER — Other Ambulatory Visit (INDEPENDENT_AMBULATORY_CARE_PROVIDER_SITE_OTHER): Payer: Managed Care, Other (non HMO)

## 2014-05-02 DIAGNOSIS — E2749 Other adrenocortical insufficiency: Secondary | ICD-10-CM

## 2014-05-02 DIAGNOSIS — E039 Hypothyroidism, unspecified: Secondary | ICD-10-CM

## 2014-05-02 LAB — TSH: TSH: 1.83 u[IU]/mL (ref 0.35–4.50)

## 2014-05-02 LAB — COMPREHENSIVE METABOLIC PANEL
ALT: 18 U/L (ref 0–35)
AST: 32 U/L (ref 0–37)
Albumin: 4.1 g/dL (ref 3.5–5.2)
Alkaline Phosphatase: 58 U/L (ref 39–117)
BUN: 21 mg/dL (ref 6–23)
CO2: 30 mEq/L (ref 19–32)
Calcium: 9.5 mg/dL (ref 8.4–10.5)
Chloride: 99 mEq/L (ref 96–112)
Creatinine, Ser: 0.7 mg/dL (ref 0.4–1.2)
GFR: 97.27 mL/min (ref >60.00)
Glucose, Bld: 89 mg/dL (ref 70–99)
Potassium: 4.1 mEq/L (ref 3.5–5.1)
Sodium: 137 mEq/L (ref 135–145)
Total Bilirubin: 0.3 mg/dL (ref 0.2–1.2)
Total Protein: 6.9 g/dL (ref 6.0–8.3)

## 2014-05-02 LAB — T4, FREE: Free T4: 1.03 ng/dL (ref 0.60–1.60)

## 2014-05-15 ENCOUNTER — Ambulatory Visit (INDEPENDENT_AMBULATORY_CARE_PROVIDER_SITE_OTHER): Payer: Managed Care, Other (non HMO) | Admitting: Endocrinology

## 2014-05-15 ENCOUNTER — Encounter: Payer: Self-pay | Admitting: Endocrinology

## 2014-05-15 VITALS — BP 89/52 | HR 80 | Temp 98.0°F | Resp 14 | Ht 63.0 in | Wt 147.0 lb

## 2014-05-15 DIAGNOSIS — E2749 Other adrenocortical insufficiency: Secondary | ICD-10-CM

## 2014-05-15 DIAGNOSIS — E039 Hypothyroidism, unspecified: Secondary | ICD-10-CM

## 2014-05-15 NOTE — Patient Instructions (Signed)
Check Bp periodically including standing and call if < 100

## 2014-05-15 NOTE — Progress Notes (Signed)
Elizabeth ApleyLaura A Lamb 58 y.o.   Chief complaint: Followup of adrenal insufficiency and thyroid    History of Present Illness:   1. Patient has had secondary adrenal insufficiency since 07/2008 when she was admitted to the hospital with a sodium of 121. She also has a partial empty sella but no other pituitary hormone deficiencies  Although previously she did not think she was having symptoms when running out of her hydrocortisone she now says that she feels some malaise when she does not take it regularly Also her blood pressure tends to be relatively low when she does not take her hydrocortisone regularly  She did not remember to take her hydrocortisone yesterday or last night at work and feels a little malaise today but not lightheaded Blood pressure is lower than usual today; on her past visit her blood pressure was 110 systolic  Currently working 5 days a week at night. Taking the second dose of Cortef about 3-4 hours before bedtime otherwise it causes insomnia. She denies any decreased appetite, nausea,  lightheadedness or weakness  2. Primary hypothyroidism: In 2014 she had a TSH level of 5.9 check incidentally. Free T4 has been consistently normal previously She was not complaining of unusual fatigue, cold intolerance, hair loss or dry skin She does have family history of hypothyroidism The patient wanted to empirically try thyroid supplement in 5/14 and has been taking 25 mcg daily of levothyroxine since then With this dose she thinks her energy level improved significantly  No recent unusual fatigue or hair loss. However has gained some weight Has been quite regular with taking her medication on waking up daily TSH is 1.8   Wt Readings from Last 3 Encounters:  05/15/14 147 lb (66.679 kg)  09/29/13 141 lb (63.957 kg)    No visits with results within 1 Week(s) from this visit. Latest known visit with results is:  Appointment on 05/02/2014  Component Date Value Ref Range  Status  . Sodium 05/02/2014 137  135 - 145 mEq/L Final  . Potassium 05/02/2014 4.1  3.5 - 5.1 mEq/L Final  . Chloride 05/02/2014 99  96 - 112 mEq/L Final  . CO2 05/02/2014 30  19 - 32 mEq/L Final  . Glucose, Bld 05/02/2014 89  70 - 99 mg/dL Final  . BUN 16/10/960408/01/2014 21  6 - 23 mg/dL Final  . Creatinine, Ser 05/02/2014 0.7  0.4 - 1.2 mg/dL Final  . Total Bilirubin 05/02/2014 0.3  0.2 - 1.2 mg/dL Final  . Alkaline Phosphatase 05/02/2014 58  39 - 117 U/L Final  . AST 05/02/2014 32  0 - 37 U/L Final  . ALT 05/02/2014 18  0 - 35 U/L Final  . Total Protein 05/02/2014 6.9  6.0 - 8.3 g/dL Final  . Albumin 54/09/811908/01/2014 4.1  3.5 - 5.2 g/dL Final  . Calcium 14/78/295608/01/2014 9.5  8.4 - 10.5 mg/dL Final  . GFR 21/30/865708/01/2014 97.27  >60.00 mL/min Final  . Free T4 05/02/2014 1.03  0.60 - 1.60 ng/dL Final  . TSH 84/69/629508/01/2014 1.83  0.35 - 4.50 uIU/mL Final      Medication List       This list is accurate as of: 05/15/14  8:40 AM.  Always use your most recent med list.               cholecalciferol 1000 UNITS tablet  Commonly known as:  VITAMIN D  Take 1,000 Units by mouth daily.     eletriptan 40 MG tablet  Commonly known as:  RELPAX  Take 1 tablet (40 mg total) by mouth as needed for migraine or headache. One tablet by mouth at onset of headache. May repeat in 2 hours if headache persists or recurs.     hydrocortisone 10 MG tablet  Commonly known as:  CORTEF  Take 2 tablets by mouth every morning and 1 tablet at night.     levothyroxine 25 MCG tablet  Commonly known as:  SYNTHROID, LEVOTHROID  Take 1 tablet (25 mcg total) by mouth daily before breakfast.     multivitamin with minerals Tabs tablet  Take 1 tablet by mouth daily.        Allergies:  Allergies  Allergen Reactions  . Vibramycin [Doxycycline Calcium] Rash    Past Medical History  Diagnosis Date  . Allergy   . Hypoadrenalism   . Pituitary abnormality     Past Surgical History  Procedure Laterality Date  . Cholecystectomy     . Tubal ligation    . Tonsillectomy    . Abdominal hysterectomy      Partial    Family History  Problem Relation Age of Onset  . Congestive Heart Failure Mother   . Thyroid disease Mother   . Cancer Father     pancreatic  . Diabetes Maternal Grandmother     Social History:  reports that she has been smoking.  She does not have any smokeless tobacco history on file. She reports that she drinks alcohol. She reports that she does not use illicit drugs.  She is working as a Sports administrator  REVIEW Of SYSTEMS:  She has a previous history of migraine headaches, taking Relpax as needed No history of hypertension or diabetes  She had been previously advised to take vitamin D 1000 units empirically for supplementation and osteoporosis prevention.  Taking only a multivitamin currently, also takes Tums bid   Examination:   BP 89/52  Pulse 80  Temp(Src) 98 F (36.7 C)  Resp 14  Ht 5\' 3"  (1.6 m)  Wt 147 lb (66.679 kg)  BMI 26.05 kg/m2  SpO2 97%  She looks well  Skin appears normal.  No peripheral edema  Assessment/Plan:   1. Secondary adrenal insufficiency, long-standing and stable. Currently asymptomatic and doing well with full replacement doses of Cortef. She now feels that she gets a feeling of malaise with not taking her medication regularly. Also today her blood pressure is relatively low with her  Forgetting the medication yesterday. Not clear if she will need Florinef. She will try to check her blood pressure periodically at work standing up and call if below 100 systolic 2.  Probable primary hypothyroidism without a goiter. She has done well with low-dose supplementation of 25 mcg. TSH is normal again. She will continue 25 mcg. She is taking the medication on empty stomach without any calcium or iron supplements  3. History of migraine headaches, she will followup with neurologist   4. Vitamin D deficiency: Needs at least 800 units a  day   Elizabeth Lamb 05/15/2014, 8:40 AM

## 2014-07-10 ENCOUNTER — Telehealth: Payer: Self-pay | Admitting: Endocrinology

## 2014-07-10 NOTE — Telephone Encounter (Signed)
Noted, patient is aware. 

## 2014-07-10 NOTE — Telephone Encounter (Signed)
Log:  05/24/14 116/62 pulse 74 9/3 120/60 pulse 70 9/17 114/66 pulse 76 10/12 112/70 pulse 74   Vitamin D in [place] vitamin 600 iu 150% of daily value for adults

## 2014-07-10 NOTE — Telephone Encounter (Signed)
Looks fine, no change in medication needed

## 2014-07-10 NOTE — Telephone Encounter (Signed)
Please see below.

## 2014-08-13 ENCOUNTER — Other Ambulatory Visit: Payer: Managed Care, Other (non HMO)

## 2014-08-13 ENCOUNTER — Other Ambulatory Visit: Payer: Self-pay | Admitting: *Deleted

## 2014-08-13 ENCOUNTER — Other Ambulatory Visit (INDEPENDENT_AMBULATORY_CARE_PROVIDER_SITE_OTHER): Payer: Managed Care, Other (non HMO)

## 2014-08-13 DIAGNOSIS — E2749 Other adrenocortical insufficiency: Secondary | ICD-10-CM

## 2014-08-13 DIAGNOSIS — E039 Hypothyroidism, unspecified: Secondary | ICD-10-CM

## 2014-08-13 MED ORDER — HYDROCORTISONE 10 MG PO TABS: ORAL_TABLET | ORAL | Status: DC

## 2014-08-14 LAB — TSH: TSH: 2.25 u[IU]/mL (ref 0.35–4.50)

## 2014-08-15 ENCOUNTER — Emergency Department (HOSPITAL_COMMUNITY)
Admission: EM | Admit: 2014-08-15 | Discharge: 2014-08-15 | Disposition: A | Discharge status: Home / Self Care | Payer: Managed Care, Other (non HMO) | Attending: Emergency Medicine | Admitting: Emergency Medicine

## 2014-08-15 ENCOUNTER — Encounter (HOSPITAL_COMMUNITY): Payer: Self-pay | Admitting: Emergency Medicine

## 2014-08-15 DIAGNOSIS — Z720 Tobacco use: Secondary | ICD-10-CM | POA: Insufficient documentation

## 2014-08-15 DIAGNOSIS — Z79899 Other long term (current) drug therapy: Secondary | ICD-10-CM | POA: Diagnosis not present

## 2014-08-15 DIAGNOSIS — Z7952 Long term (current) use of systemic steroids: Secondary | ICD-10-CM | POA: Insufficient documentation

## 2014-08-15 DIAGNOSIS — M79609 Pain in unspecified limb: Secondary | ICD-10-CM

## 2014-08-15 DIAGNOSIS — Z8639 Personal history of other endocrine, nutritional and metabolic disease: Secondary | ICD-10-CM | POA: Diagnosis not present

## 2014-08-15 DIAGNOSIS — M79662 Pain in left lower leg: Secondary | ICD-10-CM | POA: Insufficient documentation

## 2014-08-15 LAB — COMPREHENSIVE METABOLIC PANEL
ALT: 20 U/L (ref 0–35)
AST: 39 U/L — ABNORMAL HIGH (ref 0–37)
Albumin: 4.2 g/dL (ref 3.5–5.2)
Alkaline Phosphatase: 46 U/L (ref 39–117)
BUN: 13 mg/dL (ref 6–23)
CO2: 21 mEq/L (ref 19–32)
Calcium: 9.3 mg/dL (ref 8.4–10.5)
Chloride: 107 mEq/L (ref 96–112)
Creatinine, Ser: 0.8 mg/dL (ref 0.4–1.2)
GFR: 83.85 mL/min (ref >60.00)
Glucose, Bld: 71 mg/dL (ref 70–99)
Potassium: 4.2 mEq/L (ref 3.5–5.1)
Sodium: 143 mEq/L (ref 135–145)
Total Bilirubin: 0.3 mg/dL (ref 0.2–1.2)
Total Protein: 6.6 g/dL (ref 6.0–8.3)

## 2014-08-15 MED ORDER — ACETAMINOPHEN 325 MG PO TABS
650.0000 mg | ORAL_TABLET | Freq: Once | ORAL | Status: DC
  Filled 2014-08-15: qty 2

## 2014-08-15 NOTE — ED Notes (Signed)
Ultrasound in with patient

## 2014-08-15 NOTE — ED Provider Notes (Signed)
CSN: [id number]     Arrival date & time 08/15/14  1051 History   First MD Initiated Contact with Patient 08/15/14 1108     Chief Complaint  Patient presents with  . Leg Pain     (Consider location/radiation/quality/duration/timing/severity/associated sxs/prior Treatment) HPI Comments: 58 year old female with secondary adrenal insufficiency presents with left calf tightness and pain since Monday. Patient wore new shoes in the weekend however patient has no reproduction of symptoms with movement or palpation, no injuries, no blood clot history, no recent surgery, no swelling, no vascular disease, not worse with walking, no rash. Patient is a Engineer, civil (consulting)nurse. Symptoms fairly constant.  Patient is a 58 y.o. female presenting with leg pain. The history is provided by the patient.  Leg Pain Associated symptoms: no back pain, no fever and no neck pain     Past Medical History  Diagnosis Date  . Allergy   . Hypoadrenalism   . Pituitary abnormality    Past Surgical History  Procedure Laterality Date  . Cholecystectomy    . Tubal ligation    . Tonsillectomy    . Abdominal hysterectomy      Partial  . Rotator repair     Family History  Problem Relation Age of Onset  . Congestive Heart Failure Mother   . Thyroid disease Mother   . Hypertension Mother   . Cancer Father     pancreatic  . Hypertension Father   . Diabetes Maternal Grandmother    History  Substance Use Topics  . Smoking status: Current Every Day Smoker  . Smokeless tobacco: Not on file  . Alcohol Use: Yes   OB History    No data available     Review of Systems  Constitutional: Negative for fever and chills.  HENT: Negative for congestion.   Eyes: Negative for visual disturbance.  Respiratory: Negative for shortness of breath.   Cardiovascular: Negative for chest pain and leg swelling.  Gastrointestinal: Negative for vomiting and abdominal pain.  Genitourinary: Negative for dysuria and flank pain.  Musculoskeletal:  Negative for back pain, neck pain and neck stiffness.  Skin: Negative for rash.  Neurological: Negative for light-headedness and headaches.      Allergies  Vibramycin  Home Medications   Prior to Admission medications   Medication Sig Start Date End Date Taking? Authorizing Provider  calcium carbonate (TUMS - DOSED IN MG ELEMENTAL CALCIUM) 500 MG chewable tablet Chew 2 tablets by mouth daily.   Yes Historical Provider, MD  eletriptan (RELPAX) 40 MG tablet Take 1 tablet (40 mg total) by mouth as needed for migraine or headache. One tablet by mouth at onset of headache. May repeat in 2 hours if headache persists or recurs. 02/23/14  Yes Reather LittlerAjay Kumar, MD  hydrocortisone (CORTEF) 10 MG tablet Take 2 tablets by mouth every morning and 1 tablet at night. 08/13/14  Yes Reather LittlerAjay Kumar, MD  ketorolac (TORADOL) 10 MG tablet Take 10 mg by mouth every 6 (six) hours as needed for severe pain.   Yes Historical Provider, MD  levothyroxine (SYNTHROID, LEVOTHROID) 25 MCG tablet Take 1 tablet (25 mcg total) by mouth daily before breakfast. 04/24/14  Yes Reather LittlerAjay Kumar, MD  naproxen sodium (ANAPROX) 220 MG tablet Take 660 mg by mouth as needed (pain).   Yes Historical Provider, MD  Pediatric Multiple Vit-C-FA (FLINSTONES GUMMIES OMEGA-3 DHA PO) Take 1 tablet by mouth daily.   Yes Historical Provider, MD   BP 120/67 mmHg  Pulse 64  Temp(Src) 98.5 F (36.9 C) (  Oral)  Resp 18  SpO2 96% Physical Exam  Constitutional: She is oriented to person, place, and time. She appears well-developed and well-nourished.  HENT:  Head: Normocephalic and atraumatic.  Eyes: Right eye exhibits no discharge. Left eye exhibits no discharge.  Neck: Normal range of motion. Neck supple. No tracheal deviation present.  Cardiovascular: Normal rate.   Pulmonary/Chest: Effort normal.  Musculoskeletal: She exhibits no edema or tenderness.  Patient shows area of pain left mid calf, unable to reproduce, no swelling, no rash, no warmth, no  induration. 2+ distal pulses in the left lower extremity. Similar size calves left-to-right grossly. Full range of motion in knee and ankle without symptoms.  Neurological: She is alert and oriented to person, place, and time.  Skin: Skin is warm. No rash noted.  Psychiatric: She has a normal mood and affect.  Nursing note and vitals reviewed.   ED Course  Procedures (including critical care time) Labs Review Labs Reviewed - No data to display  Imaging Review No results found.   EKG Interpretation None      MDM   Final diagnoses:  Calf pain, left   Patient with constant left calf pain, very low risk blood clot. No other reason for her new symptoms. Ultrasound ordered to look for DVT. No sign of arterial occlusion or infection  Vascular performed ultrasound no DVT per report phone call.  Discussed continued outpatient follow-up  . Results and differential diagnosis were discussed with the patient/parent/guardian. Close follow up outpatient was discussed, comfortable with the plan.   Medications  acetaminophen (TYLENOL) tablet 650 mg (650 mg Oral Not Given 08/15/14 1316)    Filed Vitals:   08/15/14 1103 08/15/14 1502  BP: 123/68 120/67  Pulse: 72 64  Temp: 98.5 F (36.9 C)   TempSrc: Oral   Resp: 18 18  SpO2: 99% 96%    Final diagnoses:  Calf pain, left        Enid SkeensJoshua M Jermie Hippe, MD 08/15/14 1515

## 2014-08-15 NOTE — Discharge Instructions (Signed)
If you were given medicines take as directed.  If you are on coumadin or contraceptives realize their levels and effectiveness is altered by many different medicines.  If you have any reaction (rash, tongues swelling, other) to the medicines stop taking and see a physician.   Please follow up as directed and return to the ER or see a physician for new or worsening symptoms.  Thank you. Filed Vitals:   08/15/14 1103 08/15/14 1502  BP: 123/68 120/67  Pulse: 72 64  Temp: 98.5 F (36.9 C)   TempSrc: Oral   Resp: 18 18  SpO2: 99% 96%

## 2014-08-15 NOTE — Progress Notes (Signed)
VASCULAR LAB PRELIMINARY  PRELIMINARY  PRELIMINARY  PRELIMINARY  Left lower extremity venous duplex completed.    Preliminary report:  Left:  No evidence of DVT, superficial thrombosis, or Baker's cyst.  Ebonie Westerlund, RVS 08/15/2014, 2:57 PM

## 2014-08-15 NOTE — ED Notes (Signed)
Pt c/o left lower leg tightness/pain that started 2 1/2 days ago. Pt states that she doesn't have swelling, or heat. States that weight bearing doesn't change the pain and has full ROM.  Pt states that Saturday night/sunday she wore a new pair of [employer]'s to work but not for sure if its related or not.

## 2014-08-16 ENCOUNTER — Ambulatory Visit: Payer: Managed Care, Other (non HMO) | Admitting: Endocrinology

## 2014-08-22 NOTE — Progress Notes (Signed)
Quick Note:  Please let patient know that the lab result is normal; needs appt in 8/16 with labs ______

## 2014-11-06 ENCOUNTER — Telehealth: Payer: Self-pay | Admitting: *Deleted

## 2014-11-06 NOTE — Telephone Encounter (Signed)
Pharmacy requests a refill of Levothyroxine, rx was denied because patient needs to be seen for f/u. She was due in November.

## 2014-11-09 ENCOUNTER — Ambulatory Visit (INDEPENDENT_AMBULATORY_CARE_PROVIDER_SITE_OTHER): Payer: 59 | Admitting: Endocrinology

## 2014-11-09 VITALS — BP 104/62 | HR 75 | Temp 98.1°F | Resp 14 | Ht 63.0 in | Wt 147.8 lb

## 2014-11-09 DIAGNOSIS — Z23 Encounter for immunization: Secondary | ICD-10-CM

## 2014-11-09 DIAGNOSIS — E038 Other specified hypothyroidism: Secondary | ICD-10-CM

## 2014-11-09 DIAGNOSIS — E2749 Other adrenocortical insufficiency: Secondary | ICD-10-CM

## 2014-11-09 LAB — BASIC METABOLIC PANEL
BUN: 20 mg/dL (ref 6–23)
CO2: 35 mEq/L — ABNORMAL HIGH (ref 19–32)
Calcium: 10 mg/dL (ref 8.4–10.5)
Chloride: 104 mEq/L (ref 96–112)
Creatinine, Ser: 0.74 mg/dL (ref 0.40–1.20)
GFR: 85.09 mL/min (ref >60.00)
Glucose, Bld: 98 mg/dL (ref 70–99)
Potassium: 3.7 mEq/L (ref 3.5–5.1)
Sodium: 141 mEq/L (ref 135–145)

## 2014-11-09 LAB — T4, FREE: Free T4: 0.98 ng/dL (ref 0.60–1.60)

## 2014-11-09 LAB — TSH: TSH: 4.16 u[IU]/mL (ref 0.35–4.50)

## 2014-11-09 NOTE — Progress Notes (Signed)
Elizabeth Lamb 59 y.o.   Chief complaint: Followup of adrenal insufficiency and thyroid    History of Present Illness:   1. Patient has had secondary adrenal insufficiency since 07/2008 when she was admitted to the hospital with a sodium of 121. She also has a partial empty sella but no other pituitary hormone deficiencies  She has been trying to take her hydrocortisone very regularly now, usually takes it that 7:00 and 2:00 regardless of her being working or being off. She says if she goes 24 hours without medication she will feel malaise and weak and is very compliant with her medication now Although her blood pressure on her last visit was only 89 standing she thinks her blood pressure has been normal at work, last reading 116/70 No lightheadedness on standing up She denies any decreased appetite, nausea or weakness lately and her weight is about the same  2. Primary hypothyroidism: In 2014 she had a TSH level of 5.9 check incidentally. Free T4 has been consistently normal previously She was not complaining of unusual fatigue, cold intolerance, hair loss or dry skin She does have family history of hypothyroidism The patient wanted to empirically try thyroid supplement in 5/14 and has been taking 25 mcg daily of levothyroxine since then With this dose she thinks her energy level improved  She still feels fairly good with her energy level  Has been quite regular with taking her medication on waking up daily TSH is pending    Wt Readings from Last 3 Encounters:  11/09/14 147 lb 12.8 oz (67.042 kg)  05/15/14 147 lb (66.679 kg)  09/29/13 141 lb (63.957 kg)    No visits with results within 1 Week(s) from this visit. Latest known visit with results is:  Appointment on 08/13/2014  Component Date Value Ref Range Status  . TSH 08/13/2014 2.25  0.35 - 4.50 uIU/mL Final  . Sodium 08/13/2014 143  135 - 145 mEq/L Final  . Potassium 08/13/2014 4.2  3.5 - 5.1 mEq/L Final    . Chloride 08/13/2014 107  96 - 112 mEq/L Final  . CO2 08/13/2014 21  19 - 32 mEq/L Final  . Glucose, Bld 08/13/2014 71  70 - 99 mg/dL Final  . BUN 40/98/1191 13  6 - 23 mg/dL Final  . Creatinine, Ser 08/13/2014 0.8  0.4 - 1.2 mg/dL Final  . Total Bilirubin 08/13/2014 0.3  0.2 - 1.2 mg/dL Final  . Alkaline Phosphatase 08/13/2014 46  39 - 117 U/L Final  . AST 08/13/2014 39* 0 - 37 U/L Final  . ALT 08/13/2014 20  0 - 35 U/L Final  . Total Protein 08/13/2014 6.6  6.0 - 8.3 g/dL Final  . Albumin 47/82/9562 4.2  3.5 - 5.2 g/dL Final  . Calcium 13/04/6577 9.3  8.4 - 10.5 mg/dL Final  . GFR 46/96/2952 83.85  >60.00 mL/min Final      Medication List       This list is accurate as of: 11/09/14  1:28 PM.  Always use your most recent med list.               calcium carbonate 500 MG chewable tablet  Commonly known as:  TUMS - dosed in mg elemental calcium  Chew 2 tablets by mouth daily.     eletriptan 40 MG tablet  Commonly known as:  RELPAX  Take 1 tablet (40 mg total) by mouth as needed for migraine or headache. One tablet by mouth at  onset of headache. May repeat in 2 hours if headache persists or recurs.     FLINSTONES GUMMIES OMEGA-3 DHA PO  Take 1 tablet by mouth daily.     hydrocortisone 10 MG tablet  Commonly known as:  CORTEF  Take 2 tablets by mouth every morning and 1 tablet at night.     ketorolac 10 MG tablet  Commonly known as:  TORADOL  Take 10 mg by mouth every 6 (six) hours as needed for severe pain.     levothyroxine 25 MCG tablet  Commonly known as:  SYNTHROID, LEVOTHROID  Take 1 tablet (25 mcg total) by mouth daily before breakfast.     naproxen sodium 220 MG tablet  Commonly known as:  ANAPROX  Take 660 mg by mouth as needed (pain).        Allergies:  Allergies  Allergen Reactions  . Vibramycin [Doxycycline Calcium] Rash    Past Medical History  Diagnosis Date  . Allergy   . Hypoadrenalism   . Pituitary abnormality     Past Surgical  History  Procedure Laterality Date  . Cholecystectomy    . Tubal ligation    . Tonsillectomy    . Abdominal hysterectomy      Partial  . Rotator repair      Family History  Problem Relation Age of Onset  . Congestive Heart Failure Mother   . Thyroid disease Mother   . Hypertension Mother   . Cancer Father     pancreatic  . Hypertension Father   . Diabetes Maternal Grandmother     Social History:  reports that she has been smoking.  She does not have any smokeless tobacco history on file. She reports that she drinks alcohol. She reports that she does not use illicit drugs.  She is working as a Sports administratoroccupational health nurse  REVIEW Of SYSTEMS:  She has a previous history of migraine headaches, taking Relpax as needed No history of hypertension or diabetes  She had been previously advised to take vitamin D 1000 units empirically for supplementation and osteoporosis prevention.  Taking only a multivitamin currently, also takes Tums    Examination:   BP 122/80 mmHg  Pulse 75  Temp(Src) 98.1 F (36.7 C)  Resp 14  Ht 5\' 3"  (1.6 m)  Wt 147 lb 12.8 oz (67.042 kg)  BMI 26.19 kg/m2  SpO2 97%  Standing blood pressure 104/62 She looks well Biceps reflexes are normal  No peripheral edema  Assessment/Plan:   1. Secondary adrenal insufficiency, long-standing and stable. Currently asymptomatic and doing well with full replacement doses of Cortef.  Although the blood pressure is slightly lower on standing up today she is currently asymptomatic and blood pressure is better than on her last visit.  This is likely to be from better compliance with her Cortef. Advised her to check blood pressure regularly at work including standing up and call if it is below 100 systolic.  Meanwhile she will continue regular doses of Cortef unchanged  2.  Probable primary hypothyroidism without a goiter. She has done well with low-dose supplementation of 25 mcg. TSH will need to be rechecked along with  free T4 to rule out secondary hypothyroidism also 3. History of migraine headaches, she will followup with neurologist   4. She does not have a PCP and is asking for Zostavax and influenza vaccine today which were given   [facility] LtdKUMAR,Anay Rathe 11/09/2014, 1:28 PM

## 2014-11-11 ENCOUNTER — Encounter: Payer: Self-pay | Admitting: Endocrinology

## 2014-11-11 NOTE — Progress Notes (Signed)
Quick Note:  Please let patient know that the lab result is normal and no further action needed ______ 

## 2014-12-20 ENCOUNTER — Other Ambulatory Visit: Payer: Self-pay

## 2014-12-20 MED ORDER — LEVOTHYROXINE SODIUM 25 MCG PO TABS: 25.0000 ug | ORAL_TABLET | Freq: Every day | ORAL | Status: DC

## 2015-03-13 ENCOUNTER — Other Ambulatory Visit: Payer: Self-pay

## 2015-03-13 MED ORDER — LEVOTHYROXINE SODIUM 25 MCG PO TABS: 25.0000 ug | ORAL_TABLET | Freq: Every day | ORAL | Status: DC

## 2015-04-03 ENCOUNTER — Other Ambulatory Visit: Payer: Self-pay | Admitting: *Deleted

## 2015-04-03 MED ORDER — HYDROCORTISONE 10 MG PO TABS: ORAL_TABLET | ORAL | Status: DC

## 2015-05-01 ENCOUNTER — Encounter: Payer: Self-pay | Admitting: Endocrinology

## 2015-05-06 ENCOUNTER — Other Ambulatory Visit: Payer: Managed Care, Other (non HMO)

## 2015-05-07 ENCOUNTER — Other Ambulatory Visit: Payer: Managed Care, Other (non HMO)

## 2015-05-10 ENCOUNTER — Ambulatory Visit: Payer: Managed Care, Other (non HMO) | Admitting: Endocrinology

## 2015-05-28 ENCOUNTER — Other Ambulatory Visit: Payer: 59

## 2015-05-31 ENCOUNTER — Ambulatory Visit: Payer: Self-pay | Admitting: Endocrinology

## 2015-07-09 ENCOUNTER — Telehealth: Payer: Self-pay | Admitting: Endocrinology

## 2015-07-09 NOTE — Telephone Encounter (Signed)
Lab orders mailed

## 2015-07-09 NOTE — Telephone Encounter (Signed)
Patient would like blood drawn at [facility], send  Lab orders, need Dx Code, NPI #, Name, DOB, and where labs are being sent,  [address] [place] [address]

## 2015-07-25 ENCOUNTER — Other Ambulatory Visit: Payer: 59

## 2015-08-01 ENCOUNTER — Other Ambulatory Visit: Payer: Self-pay | Admitting: *Deleted

## 2015-08-01 ENCOUNTER — Ambulatory Visit: Payer: Self-pay | Admitting: Endocrinology

## 2015-08-01 MED ORDER — LEVOTHYROXINE SODIUM 25 MCG PO TABS: 25.0000 ug | ORAL_TABLET | Freq: Every day | ORAL | Status: DC

## 2015-08-13 ENCOUNTER — Other Ambulatory Visit (INDEPENDENT_AMBULATORY_CARE_PROVIDER_SITE_OTHER): Payer: 59

## 2015-08-13 DIAGNOSIS — E038 Other specified hypothyroidism: Secondary | ICD-10-CM | POA: Diagnosis not present

## 2015-08-13 DIAGNOSIS — E2749 Other adrenocortical insufficiency: Secondary | ICD-10-CM | POA: Diagnosis not present

## 2015-08-13 LAB — BASIC METABOLIC PANEL
BUN: 14 mg/dL (ref 6–23)
CO2: 33 mEq/L — ABNORMAL HIGH (ref 19–32)
Calcium: 9.4 mg/dL (ref 8.4–10.5)
Chloride: 99 mEq/L (ref 96–112)
Creatinine, Ser: 0.66 mg/dL (ref 0.40–1.20)
GFR: 96.85 mL/min (ref >60.00)
Glucose, Bld: 94 mg/dL (ref 70–99)
Potassium: 3.5 mEq/L (ref 3.5–5.1)
Sodium: 137 mEq/L (ref 135–145)

## 2015-08-13 LAB — TSH: TSH: 0.37 u[IU]/mL (ref 0.35–4.50)

## 2015-08-16 ENCOUNTER — Ambulatory Visit (INDEPENDENT_AMBULATORY_CARE_PROVIDER_SITE_OTHER): Payer: 59 | Admitting: Endocrinology

## 2015-08-16 VITALS — BP 94/64 | HR 84 | Temp 98.0°F | Resp 14 | Ht 63.0 in | Wt 151.8 lb

## 2015-08-16 DIAGNOSIS — E038 Other specified hypothyroidism: Secondary | ICD-10-CM | POA: Diagnosis not present

## 2015-08-16 DIAGNOSIS — E2749 Other adrenocortical insufficiency: Secondary | ICD-10-CM

## 2015-08-16 NOTE — Progress Notes (Signed)
Elizabeth Lamb 59 y.o.   Chief complaint: Followup of adrenal insufficiency and hypothyroidism    History of Present Illness:   Days  2 miles  1. Patient has had secondary adrenal insufficiency since 07/2008 when she was admitted to the hospital with a sodium of 121. She also has a partial empty sella but no other pituitary hormone deficiencies  She has been working during the days now instead of night shifts She does take her medication at the same times again, usually takes it that 7:00 and 2:00 p.m. Although her blood pressure in the office stents to be low normal to does not feel lightheaded. She has been monitoring her blood pressure readings at home She says her blood pressure at home is usually over 100 systolic but has not got her monitor for comparison, she did bring her list of readings and is also checking standing up.  Most readings are between 100-120 systolic without significant drop on standing  No lightheadedness on standing up She denies any decreased appetite, nausea or weakness lately and her weight is about the same  2. Primary hypothyroidism: In 2014 she had a TSH level of 5.9 checked incidentally.  Free T4 has been consistently normal previously She was not complaining of unusual fatigue, cold intolerance, hair loss or dry skin She does have family history of hypothyroidism The patient wanted to empirically try thyroid supplement in 5/14 and has been taking 25 mcg daily of levothyroxine since then With this dose she thinks her energy level improved   She still feels no fatigue Has been quite regular with taking her medication on waking up daily TSH is again normal     Wt Readings from Last 3 Encounters:  08/16/15 151 lb 12.8 oz (68.856 kg)  11/09/14 147 lb 12.8 oz (67.042 kg)  05/15/14 147 lb (66.679 kg)    Appointment on 08/13/2015  Component Date Value Ref Range Status  . TSH 08/13/2015 0.37  0.35 - 4.50 uIU/mL Final  . Sodium  08/13/2015 137  135 - 145 mEq/L Final  . Potassium 08/13/2015 3.5  3.5 - 5.1 mEq/L Final  . Chloride 08/13/2015 99  96 - 112 mEq/L Final  . CO2 08/13/2015 33* 19 - 32 mEq/L Final  . Glucose, Bld 08/13/2015 94  70 - 99 mg/dL Final  . BUN 16/06/9603 14  6 - 23 mg/dL Final  . Creatinine, Ser 08/13/2015 0.66  0.40 - 1.20 mg/dL Final  . Calcium 54/05/8118 9.4  8.4 - 10.5 mg/dL Final  . GFR 14/78/2956 96.85  >60.00 mL/Elizabeth Final      Medication List       This list is accurate as of: 08/16/15  4:10 PM.  Always use your most recent med list.               calcium carbonate 500 MG chewable tablet  Commonly known as:  TUMS - dosed in mg elemental calcium  Chew 2 tablets by mouth daily.     eletriptan 40 MG tablet  Commonly known as:  RELPAX  Take 1 tablet (40 mg total) by mouth as needed for migraine or headache. One tablet by mouth at onset of headache. May repeat in 2 hours if headache persists or recurs.     FLINSTONES GUMMIES OMEGA-3 DHA PO  Take 1 tablet by mouth daily.     hydrocortisone 10 MG tablet  Commonly known as:  CORTEF  Take 2 tablets by mouth every morning and  1 tablet at night.     ketorolac 10 MG tablet  Commonly known as:  TORADOL  Take 10 mg by mouth every 6 (six) hours as needed for severe pain.     levothyroxine 25 MCG tablet  Commonly known as:  SYNTHROID, LEVOTHROID  Take 1 tablet (25 mcg total) by mouth daily before breakfast.     naproxen sodium 220 MG tablet  Commonly known as:  ANAPROX  Take 660 mg by mouth as needed (pain).        Allergies:  Allergies  Allergen Reactions  . Vibramycin [Doxycycline Calcium] Rash    Past Medical History  Diagnosis Date  . Allergy   . Hypoadrenalism   . Pituitary abnormality     Past Surgical History  Procedure Laterality Date  . Cholecystectomy    . Tubal ligation    . Tonsillectomy    . Abdominal hysterectomy      Partial  . Rotator repair      Family History  Problem Relation Age of Onset    . Congestive Heart Failure Mother   . Thyroid disease Mother   . Hypertension Mother   . Cancer Father     pancreatic  . Hypertension Father   . Diabetes Maternal Grandmother     Social History:  reports that she has been smoking.  She does not have any smokeless tobacco history on file. She reports that she drinks alcohol. She reports that she does not use illicit drugs.  She is working at [employer] now  REVIEW Of SYSTEMS:  She has a previous history of migraine headaches, taking Relpax as needed  She had been previously advised to take vitamin D 1000 units empirically for supplementation and osteoporosis prevention.  Taking only a multivitamin currently, also takes Tums    Examination:   BP 88/60 mmHg  Pulse 84  Temp(Src) 98 F (36.7 C)  Resp 14  Ht 5\' 3"  (1.6 m)  Wt 151 lb 12.8 oz (68.856 kg)  BMI 26.90 kg/m2  SpO2 97%  Standing blood pressure 94/64 Biceps reflexes are normal  No peripheral edema  Assessment/Plan:   1. Secondary adrenal insufficiency, long-standing and stable. Currently asymptomatic and doing well with full replacement doses of Cortef. She is taking her hydrocortisone about 6-7 hours apart and discussed that she can take her second dose late afternoon at 4-5 PM rather than at 2 PM She does appear to have low normal blood pressure today in the office but she is asymptomatic. No hyponatremia She says her blood pressure at home is usually over 100 systolic but has not got her monitor for comparison For now will not start any Florinef She will also check her blood pressure elsewhere her work to make sure it is accurate 2.  Mild primary hypothyroidism without a goiter. She has done well with low-dose supplementation of 25 mcg. TSH is again normal  Influenza vaccine has been given elsewhere  [facility],Abed Schar 08/16/2015, 4:10 PM

## 2015-08-16 NOTE — Patient Instructions (Signed)
Check BP at work  Take 2nd cortef at [employer]

## 2015-09-04 ENCOUNTER — Other Ambulatory Visit: Payer: Self-pay | Admitting: *Deleted

## 2015-09-04 MED ORDER — LEVOTHYROXINE SODIUM 25 MCG PO TABS: 25.0000 ug | ORAL_TABLET | Freq: Every day | ORAL | Status: DC

## 2015-11-11 ENCOUNTER — Other Ambulatory Visit: Payer: Self-pay | Admitting: *Deleted

## 2015-11-11 MED ORDER — HYDROCORTISONE 10 MG PO TABS: ORAL_TABLET | ORAL | Status: DC

## 2016-01-15 ENCOUNTER — Other Ambulatory Visit: Payer: Self-pay | Admitting: Endocrinology

## 2016-02-07 ENCOUNTER — Ambulatory Visit: Payer: 59 | Admitting: Endocrinology

## 2016-02-10 ENCOUNTER — Other Ambulatory Visit: Payer: 59

## 2016-02-11 ENCOUNTER — Telehealth: Payer: Self-pay | Admitting: Endocrinology

## 2016-02-11 MED ORDER — LEVOTHYROXINE SODIUM 25 MCG PO TABS: 25.0000 ug | ORAL_TABLET | Freq: Every day | ORAL | Status: DC

## 2016-02-11 NOTE — Telephone Encounter (Signed)
PT needs Levothyroxine sent to [facility]

## 2016-02-11 NOTE — Telephone Encounter (Signed)
rx sent

## 2016-02-13 ENCOUNTER — Other Ambulatory Visit: Payer: 59

## 2016-02-20 ENCOUNTER — Ambulatory Visit: Payer: 59 | Admitting: Endocrinology

## 2016-02-28 ENCOUNTER — Other Ambulatory Visit: Payer: 59

## 2016-03-02 ENCOUNTER — Other Ambulatory Visit (INDEPENDENT_AMBULATORY_CARE_PROVIDER_SITE_OTHER): Payer: 59

## 2016-03-02 DIAGNOSIS — E038 Other specified hypothyroidism: Secondary | ICD-10-CM

## 2016-03-02 DIAGNOSIS — E2749 Other adrenocortical insufficiency: Secondary | ICD-10-CM | POA: Diagnosis not present

## 2016-03-02 LAB — BASIC METABOLIC PANEL
BUN: 25 mg/dL — ABNORMAL HIGH (ref 6–23)
CO2: 27 mEq/L (ref 19–32)
Calcium: 9.8 mg/dL (ref 8.4–10.5)
Chloride: 104 mEq/L (ref 96–112)
Creatinine, Ser: 0.75 mg/dL (ref 0.40–1.20)
GFR: 83.41 mL/min (ref >60.00)
Glucose, Bld: 89 mg/dL (ref 70–99)
Potassium: 3.9 mEq/L (ref 3.5–5.1)
Sodium: 138 mEq/L (ref 135–145)

## 2016-03-02 LAB — T4, FREE: Free T4: 0.9 ng/dL (ref 0.60–1.60)

## 2016-03-02 LAB — TSH: TSH: 2.02 u[IU]/mL (ref 0.35–4.50)

## 2016-03-05 ENCOUNTER — Ambulatory Visit (INDEPENDENT_AMBULATORY_CARE_PROVIDER_SITE_OTHER): Payer: 59 | Admitting: Endocrinology

## 2016-03-05 ENCOUNTER — Encounter: Payer: Self-pay | Admitting: Endocrinology

## 2016-03-05 VITALS — BP 92/70 | HR 72 | Temp 98.8°F | Resp 16 | Ht 63.0 in | Wt 157.4 lb

## 2016-03-05 DIAGNOSIS — E2749 Other adrenocortical insufficiency: Secondary | ICD-10-CM | POA: Diagnosis not present

## 2016-03-05 DIAGNOSIS — E038 Other specified hypothyroidism: Secondary | ICD-10-CM

## 2016-03-05 NOTE — Patient Instructions (Signed)
Check stannding BP

## 2016-03-05 NOTE — Progress Notes (Signed)
Elizabeth Lamb 60 y.o.   Chief complaint: Followup of adrenal insufficiency and hypothyroidism    History of Present Illness:    1. Patient has had secondary adrenal insufficiency since 07/2008 when she was admitted to the hospital with a sodium of 121. She also has a partial empty sella but no other pituitary hormone deficiencies  She has been working during the days now instead of night shifts She does take her medication at the same times again, usually takes it that 7:00 and 2:00 p.m. Although her blood pressure in the office persistently tends to be low normal to does not feel lightheaded when standing up.  She has been monitoring her blood pressure readings periodically She says her blood pressure at home is usually over 100 systolic but has not got her monitor for comparison Most readings are between 102-104 systolic and 58 diastolic Does not check blood pressure standing up No lightheadedness on standing up She denies any decreased appetite, nausea or weakness lately Electrolytes are normal  2. Primary hypothyroidism: In 2014 she had a TSH level of 5.9 checked incidentally.  Free T4 has been consistently normal previously She was not complaining of unusual fatigue, cold intolerance, hair loss or dry skin She does have family history of hypothyroidism The patient wanted to empirically try thyroid supplement in 5/14 and has been taking 25 mcg daily of levothyroxine since then With starting this she felt that her energy level improved    Not complaining of any fatigue now She thinks her weight gain may have been related to eating out a lot recently Has been quite regular with taking her medication on waking up daily TSH is again normal    Wt Readings from Last 3 Encounters:  03/05/16 157 lb 6 oz (71.385 kg)  08/16/15 151 lb 12.8 oz (68.856 kg)  11/09/14 147 lb 12.8 oz (67.042 kg)    Lab on 03/02/2016  Component Date Value Ref Range Status  . Sodium  03/02/2016 138  135 - 145 mEq/L Final  . Potassium 03/02/2016 3.9  3.5 - 5.1 mEq/L Final  . Chloride 03/02/2016 104  96 - 112 mEq/L Final  . CO2 03/02/2016 27  19 - 32 mEq/L Final  . Glucose, Bld 03/02/2016 89  70 - 99 mg/dL Final  . BUN 47/82/9562 25* 6 - 23 mg/dL Final  . Creatinine, Ser 03/02/2016 0.75  0.40 - 1.20 mg/dL Final  . Calcium 13/04/6577 9.8  8.4 - 10.5 mg/dL Final  . GFR 46/96/2952 83.41  >60.00 mL/min Final  . TSH 03/02/2016 2.02  0.35 - 4.50 uIU/mL Final  . Free T4 03/02/2016 0.90  0.60 - 1.60 ng/dL Final      Medication List       This list is accurate as of: 03/05/16  9:21 PM.  Always use your most recent med list.               calcium carbonate 500 MG chewable tablet  Commonly known as:  TUMS - dosed in mg elemental calcium  Chew 2 tablets by mouth daily.     eletriptan 40 MG tablet  Commonly known as:  RELPAX  Take 1 tablet (40 mg total) by mouth as needed for migraine or headache. One tablet by mouth at onset of headache. May repeat in 2 hours if headache persists or recurs.     hydrocortisone 10 MG tablet  Commonly known as:  CORTEF  TAKE 2 TABLETS BY MOUTH EVERY MORNING  AND TAKE 1 TABLET BY MOUTH AT NIGHT     ketorolac 10 MG tablet  Commonly known as:  TORADOL  Take 10 mg by mouth every 6 (six) hours as needed for severe pain.     levothyroxine 25 MCG tablet  Commonly known as:  SYNTHROID, LEVOTHROID  Take 1 tablet (25 mcg total) by mouth daily before breakfast.     MULTIVITAMIN ADULT Chew  Chew 1 each by mouth daily.     naproxen sodium 220 MG tablet  Commonly known as:  ANAPROX  Take 660 mg by mouth as needed (pain).        Allergies:  Allergies  Allergen Reactions  . Vibramycin [Doxycycline Calcium] Rash    Past Medical History  Diagnosis Date  . Allergy   . Hypoadrenalism (HCC)   . Pituitary abnormality [facility](HCC)     Past Surgical History  Procedure Laterality Date  . Cholecystectomy    . Tubal ligation    . Tonsillectomy      . Abdominal hysterectomy      Partial  . Rotator repair      Family History  Problem Relation Age of Onset  . Congestive Heart Failure Mother   . Thyroid disease Mother   . Hypertension Mother   . Cancer Father     pancreatic  . Hypertension Father   . Diabetes Maternal Grandmother     Social History:  reports that she has been smoking.  She does not have any smokeless tobacco history on file. She reports that she drinks alcohol. She reports that she does not use illicit drugs.   REVIEW Of SYSTEMS:  She has a previous history of migraine headaches, taking Relpax as needed  She had been previously advised to take vitamin D 1000 units empirically for supplementation and osteoporosis prevention.    Examination:   BP 92/70 mmHg  Pulse 72  Temp(Src) 98.8 F (37.1 C) (Oral)  Resp 16  Ht 5\' 3"  (1.6 m)  Wt 157 lb 6 oz (71.385 kg)  BMI 27.88 kg/m2  Standing blood pressure 92/70, sitting blood pressure 127/76  No cushingoid features on exam  Assessment/Plan:   1. Secondary adrenal insufficiency, long-standing and stable.  Clinically doing well with full replacement doses of Cortef. Although she does appear to have low normal blood pressures persistently she is not symptomatic from this and also has only a decline in systolic blood pressure when standing Electrolytes are normal No evidence of She is taking her hydrocortisone very regularly and will continue  For now will not start any Florinef She will also check her blood pressure standing at times  2.  Mild primary hypothyroidism without a goiter. She has done well with low-dose supplementation of 25 mcg.  TSH is again normal   Follow-up in 6 months  Giovana Faciane 03/05/2016, 9:21 PM

## 2016-03-09 ENCOUNTER — Other Ambulatory Visit: Payer: Self-pay | Admitting: Endocrinology

## 2016-06-10 ENCOUNTER — Other Ambulatory Visit: Payer: Self-pay | Admitting: Endocrinology

## 2016-08-29 ENCOUNTER — Other Ambulatory Visit: Payer: Self-pay | Admitting: Endocrinology

## 2016-08-31 ENCOUNTER — Telehealth: Payer: Self-pay | Admitting: Endocrinology

## 2016-08-31 MED ORDER — HYDROCORTISONE 10 MG PO TABS: ORAL_TABLET | ORAL | 0 refills | Status: DC

## 2016-08-31 NOTE — Telephone Encounter (Signed)
Pt needs her Hydro[facility]ort[facility]one [facility]

## 2016-08-31 NOTE — Telephone Encounter (Signed)
Rx sent electronically.  

## 2016-09-02 ENCOUNTER — Telehealth: Payer: Self-pay | Admitting: Endocrinology

## 2016-09-02 ENCOUNTER — Other Ambulatory Visit: Payer: Self-pay

## 2016-09-02 MED ORDER — ELETRIPTAN HYDROBROMIDE 40 MG PO TABS: 40.0000 mg | ORAL_TABLET | ORAL | 1 refills | Status: DC | PRN

## 2016-09-02 NOTE — Telephone Encounter (Signed)
Ordered 09/02/16 

## 2016-09-02 NOTE — Telephone Encounter (Signed)
Patient need a new prescription Relapax 40  Mg  [facility] - [place], [place] - [address] [phone] (Phone) [phone] (Fax)

## 2016-09-02 NOTE — Telephone Encounter (Signed)
Pt needs her Relpax refilled to [facility].

## 2016-09-03 ENCOUNTER — Other Ambulatory Visit: Payer: Self-pay

## 2016-09-03 MED ORDER — ELETRIPTAN HYDROBROMIDE 40 MG PO TABS: 40.0000 mg | ORAL_TABLET | ORAL | 1 refills | Status: DC | PRN

## 2016-09-03 NOTE — Telephone Encounter (Signed)
Ordered 09/03/16 

## 2016-09-08 ENCOUNTER — Other Ambulatory Visit: Payer: 59

## 2016-09-14 ENCOUNTER — Ambulatory Visit: Payer: 59 | Admitting: Endocrinology

## 2016-11-12 ENCOUNTER — Other Ambulatory Visit (INDEPENDENT_AMBULATORY_CARE_PROVIDER_SITE_OTHER): Payer: 59

## 2016-11-12 DIAGNOSIS — E2749 Other adrenocortical insufficiency: Secondary | ICD-10-CM | POA: Diagnosis not present

## 2016-11-12 DIAGNOSIS — E038 Other specified hypothyroidism: Secondary | ICD-10-CM

## 2016-11-12 LAB — BASIC METABOLIC PANEL
BUN: 16 mg/dL (ref 6–23)
CO2: 34 mEq/L — ABNORMAL HIGH (ref 19–32)
Calcium: 9.7 mg/dL (ref 8.4–10.5)
Chloride: 104 mEq/L (ref 96–112)
Creatinine, Ser: 0.7 mg/dL (ref 0.40–1.20)
GFR: 90.12 mL/min (ref >60.00)
Glucose, Bld: 94 mg/dL (ref 70–99)
Potassium: 3.9 mEq/L (ref 3.5–5.1)
Sodium: 139 mEq/L (ref 135–145)

## 2016-11-12 LAB — T4, FREE: Free T4: 0.92 ng/dL (ref 0.60–1.60)

## 2016-11-12 LAB — TSH: TSH: 2.26 u[IU]/mL (ref 0.35–4.50)

## 2016-11-13 ENCOUNTER — Other Ambulatory Visit: Payer: 59

## 2016-11-17 ENCOUNTER — Other Ambulatory Visit: Payer: Self-pay | Admitting: Endocrinology

## 2016-11-20 ENCOUNTER — Encounter: Payer: Self-pay | Admitting: Endocrinology

## 2016-11-20 ENCOUNTER — Ambulatory Visit (INDEPENDENT_AMBULATORY_CARE_PROVIDER_SITE_OTHER): Payer: 59 | Admitting: Endocrinology

## 2016-11-20 VITALS — BP 118/70 | HR 69 | Ht 63.0 in | Wt 156.0 lb

## 2016-11-20 DIAGNOSIS — E038 Other specified hypothyroidism: Secondary | ICD-10-CM | POA: Diagnosis not present

## 2016-11-20 DIAGNOSIS — E2749 Other adrenocortical insufficiency: Secondary | ICD-10-CM

## 2016-11-20 NOTE — Progress Notes (Signed)
Elizabeth ApleyLaura A Lamb 61 y.o.   Chief complaint: Followup of adrenal insufficiency and hypothyroidism    History of Present Illness:    1. Patient has had secondary adrenal insufficiency since 07/2008 when she was admitted to the hospital with a sodium of 121. She also has a partial empty sella but no other pituitary hormone deficiencies  She does take her medication at the same times, usually takes it that 7:00 and 2:00 p.m.  She has been monitoring her blood pressure readings periodically Most readings are between 110-118 systolic Periodically her blood pressure below normal in the office without symptoms  No lightheadedness on standing up recently She denies any decreased appetite, nausea or weakness lately Electrolytes are normal  She did have a respiratory illness in December with fever but on her own did not increase her hydrocortisone dose, did not have a low blood pressure however  2. Primary hypothyroidism: In 2014 she had a TSH level of 5.9 checked incidentally.  Free T4 has been consistently normal previously She was not complaining of unusual fatigue, cold intolerance, hair loss or dry skin She does have family history of hypothyroidism The patient wanted to empirically try thyroid supplement in 5/14 and has been taking 25 mcg daily of levothyroxine since then With starting this she felt that her energy level improved    Not complaining of any fatigue more recently Has been quite regular with taking her medication on waking up daily TSH is again normal with her dose of 25 g    Wt Readings from Last 3 Encounters:  11/20/16 156 lb (70.8 kg)  03/05/16 157 lb 6 oz (71.4 kg)  08/16/15 151 lb 12.8 oz (68.9 kg)    No visits with results within 1 Week(s) from this visit.  Latest known visit with results is:  Lab on 11/12/2016  Component Date Value Ref Range Status  . TSH 11/12/2016 2.26  0.35 - 4.50 uIU/mL Final  . Free T4 11/12/2016 0.92  0.60 - 1.60  ng/dL Final   Comment: Specimens from patients who are undergoing biotin therapy and /or ingesting biotin supplements may contain high levels of biotin.  The higher biotin concentration in these specimens interferes with this Free T4 assay.  Specimens that contain high levels  of biotin may cause false high results for this Free T4 assay.  Please interpret results in light of the total clinical presentation of the patient.    . Sodium 11/12/2016 139  135 - 145 mEq/L Final  . Potassium 11/12/2016 3.9  3.5 - 5.1 mEq/L Final  . Chloride 11/12/2016 104  96 - 112 mEq/L Final  . CO2 11/12/2016 34* 19 - 32 mEq/L Final  . Glucose, Bld 11/12/2016 94  70 - 99 mg/dL Final  . BUN 96/04/540902/15/2018 16  6 - 23 mg/dL Final  . Creatinine, Ser 11/12/2016 0.70  0.40 - 1.20 mg/dL Final  . Calcium 81/19/147802/15/2018 9.7  8.4 - 10.5 mg/dL Final  . GFR 29/56/213002/15/2018 90.12  >60.00 mL/min Final    Allergies as of 11/20/2016      Reactions   Vibramycin [doxycycline Calcium] Rash      Medication List       Accurate as of 11/20/16  4:50 PM. Always use your most recent med list.          calcium carbonate 500 MG chewable tablet Commonly known as:  TUMS - dosed in mg elemental calcium Chew 2 tablets by mouth daily.   eletriptan 40  MG tablet Commonly known as:  RELPAX Take 1 tablet (40 mg total) by mouth as needed for migraine or headache. One tablet by mouth at onset of headache. May repeat in 2 hours if headache persists or recurs.   hydrocortisone 10 MG tablet Commonly known as:  CORTEF TAKE 2 TABLETS BY MOUTH EVERY MORNING AND 1 AT NIGHT   ketorolac 10 MG tablet Commonly known as:  TORADOL Take 10 mg by mouth every 6 (six) hours as needed for severe pain.   levothyroxine 25 MCG tablet Commonly known as:  SYNTHROID, LEVOTHROID TAKE 1 TABLET BY MOUTH DAILY BEFORE BREAKFAST   MULTIVITAMIN ADULT Chew Chew 1 each by mouth daily.   naproxen sodium 220 MG tablet Commonly known as:  ANAPROX Take 660 mg by mouth as  needed (pain).       Allergies:  Allergies  Allergen Reactions  . Vibramycin [Doxycycline Calcium] Rash    Past Medical History:  Diagnosis Date  . Allergy   . Hypoadrenalism (HCC)   . Pituitary abnormality [facility])     Past Surgical History:  Procedure Laterality Date  . ABDOMINAL HYSTERECTOMY     Partial  . CHOLECYSTECTOMY    . rotator repair    . TONSILLECTOMY    . TUBAL LIGATION      Family History  Problem Relation Age of Onset  . Congestive Heart Failure Mother   . Thyroid disease Mother   . Hypertension Mother   . Cancer Father     pancreatic  . Hypertension Father   . Diabetes Maternal Grandmother     Social History:  reports that she has been smoking.  She has never used smokeless tobacco. She reports that she drinks alcohol. She reports that she does not use drugs.   REVIEW Of SYSTEMS:  She has a previous history of migraine headaches, taking Relpax as needed  She had been previously advised to take vitamin D 1000 units empirically for supplementation and osteoporosis prevention.    Examination:   BP 118/70   Pulse 69   Ht 5\' 3"  (1.6 m)   Wt 156 lb (70.8 kg)   SpO2 97%   BMI 27.63 kg/m   Standing blood pressure 108/72  No edema No cushingoid features on exam  Assessment/Plan:   1. Secondary adrenal insufficiency, long-standing and stable.  Clinically doing well again with full replacement doses of Cortef.  Electrolytes are normal No evidence of orthostasis today She is taking her hydrocortisone very regularly  Discussed the need to take at least 2-3 times the regular dose when she is having any acute illness  She will also check her blood pressure standing at times when monitoring at work  2.  Mild primary hypothyroidism without a goiter.  She is not complaining of unusual fatigue and continues on 25 g only TSH is again normal   Follow-up in 6 months  Benigno Check 11/20/2016, 4:50 PM

## 2016-11-22 MED ORDER — HYDROCORTISONE 10 MG PO TABS: ORAL_TABLET | ORAL | 5 refills | Status: DC

## 2016-11-25 ENCOUNTER — Telehealth: Payer: Self-pay | Admitting: Endocrinology

## 2016-11-25 NOTE — Telephone Encounter (Signed)
Refill of   levothyroxine (SYNTHROID, LEVOTHROID) 25 MCG tablet  hydrocortisone (CORTEF) 10 MG tablet  eletriptan (RELPAX) 40 MG tablet  6 mons refill   [facility] - [place] - [address] [place] [address] [phone] (Phone) [phone] (Fax)

## 2016-11-26 MED ORDER — LEVOTHYROXINE SODIUM 25 MCG PO TABS: ORAL_TABLET | ORAL | 1 refills | Status: DC

## 2016-11-26 MED ORDER — HYDROCORTISONE 10 MG PO TABS: ORAL_TABLET | ORAL | 5 refills | Status: DC

## 2016-11-26 MED ORDER — ELETRIPTAN HYDROBROMIDE 40 MG PO TABS: ORAL_TABLET | ORAL | 1 refills | Status: DC

## 2016-11-26 NOTE — Telephone Encounter (Signed)
Refills submitted.  

## 2017-03-22 ENCOUNTER — Telehealth: Payer: Self-pay | Admitting: Endocrinology

## 2017-03-22 NOTE — Telephone Encounter (Signed)
Patient would like a phone call back today to get advise about why she gets migraines when getting bit by ticks. Patient wants to know if this is due to her adrenal glands or if she needs to see her PCP. Patient wants to speak to nurse before making an appointment with PCP. Call patient to advise today.

## 2017-03-22 NOTE — Telephone Encounter (Signed)
Nothing to do with the adrenal glands

## 2017-03-22 NOTE — Telephone Encounter (Signed)
Patient probably needs to see her PCP for this. Please advise.

## 2017-03-23 ENCOUNTER — Ambulatory Visit: Payer: 59 | Admitting: Endocrinology

## 2017-03-23 NOTE — Telephone Encounter (Signed)
This patient should have been informed to see her PCP for this. She was on the schedule for 8am this morning but Dr. Lucianne MussKumar said she needs to see her PCP. Sent a message through Skype for Judeth CornfieldStephanie to reach out to patient and advise to see PCP.

## 2017-03-23 NOTE — Telephone Encounter (Signed)
This has been done.

## 2017-04-07 ENCOUNTER — Other Ambulatory Visit: Payer: Self-pay | Admitting: Endocrinology

## 2017-05-17 ENCOUNTER — Other Ambulatory Visit (INDEPENDENT_AMBULATORY_CARE_PROVIDER_SITE_OTHER): Payer: 59

## 2017-05-17 DIAGNOSIS — E2749 Other adrenocortical insufficiency: Secondary | ICD-10-CM | POA: Diagnosis not present

## 2017-05-17 DIAGNOSIS — E038 Other specified hypothyroidism: Secondary | ICD-10-CM

## 2017-05-17 LAB — BASIC METABOLIC PANEL
BUN: 10 mg/dL (ref 6–23)
CO2: 26 mEq/L (ref 19–32)
Calcium: 9.2 mg/dL (ref 8.4–10.5)
Chloride: 105 mEq/L (ref 96–112)
Creatinine, Ser: 0.72 mg/dL (ref 0.40–1.20)
GFR: 87.09 mL/min (ref >60.00)
Glucose, Bld: 108 mg/dL — ABNORMAL HIGH (ref 70–99)
Potassium: 4.1 mEq/L (ref 3.5–5.1)
Sodium: 138 mEq/L (ref 135–145)

## 2017-05-17 LAB — TSH: TSH: 1.93 u[IU]/mL (ref 0.35–4.50)

## 2017-05-17 LAB — T4, FREE: Free T4: 1.23 ng/dL (ref 0.60–1.60)

## 2017-05-20 ENCOUNTER — Encounter: Payer: Self-pay | Admitting: Endocrinology

## 2017-05-20 ENCOUNTER — Ambulatory Visit (INDEPENDENT_AMBULATORY_CARE_PROVIDER_SITE_OTHER): Payer: 59 | Admitting: Endocrinology

## 2017-05-20 VITALS — BP 120/68 | HR 71 | Ht 63.0 in | Wt 153.8 lb

## 2017-05-20 DIAGNOSIS — E2749 Other adrenocortical insufficiency: Secondary | ICD-10-CM

## 2017-05-20 DIAGNOSIS — E038 Other specified hypothyroidism: Secondary | ICD-10-CM

## 2017-05-20 NOTE — Progress Notes (Signed)
Elizabeth Lamb 61 y.o.   Chief complaint: Followup of adrenal insufficiency and hypothyroidism    History of Present Illness:    1. Patient has had secondary adrenal insufficiency since 07/2008 when she was admitted to the hospital with a sodium of 121. She also has a partial empty sella but no other pituitary hormone deficiencies  She does take her medication at the same times, usually takes it that 7:00 and 4:30 p.m. On a rare occasion she may forget to take her medication on Saturday morning but she will take it when she starts feeling off  She has been monitoring her blood pressure readings periodically at work Most readings are between 108-122 systolic and 60-70 diastolic No lightheadedness on standing up recently She denies any decreased appetite, nausea or weakness lately Electrolytes are normal   2. Primary hypothyroidism: In 2014 she had a TSH level of 5.9 checked incidentally.  Free T4 has been consistently normal previously She was not complaining of unusual fatigue, cold intolerance, hair loss or dry skin She does have family history of hypothyroidism The patient wanted to empirically try thyroid supplement in 5/14 and has been taking 25 mcg daily of levothyroxine since then With starting this she felt that her energy level improved    Not complaining of any fatigue now Has been quite regular with taking her medication on waking up daily TSH is again normal with her dose of 25 g    Wt Readings from Last 3 Encounters:  05/20/17 153 lb 12.8 oz (69.8 kg)  11/20/16 156 lb (70.8 kg)  03/05/16 157 lb 6 oz (71.4 kg)    Lab on 05/17/2017  Component Date Value Ref Range Status  . Sodium 05/17/2017 138  135 - 145 mEq/L Final  . Potassium 05/17/2017 4.1  3.5 - 5.1 mEq/L Final  . Chloride 05/17/2017 105  96 - 112 mEq/L Final  . CO2 05/17/2017 26  19 - 32 mEq/L Final  . Glucose, Bld 05/17/2017 108* 70 - 99 mg/dL Final  . BUN 62/13/0865 10  6 - 23 mg/dL  Final  . Creatinine, Ser 05/17/2017 0.72  0.40 - 1.20 mg/dL Final  . Calcium 78/46/9629 9.2  8.4 - 10.5 mg/dL Final  . GFR 52/84/1324 87.09  >60.00 mL/min Final  . Free T4 05/17/2017 1.23  0.60 - 1.60 ng/dL Final   Comment: Specimens from patients who are undergoing biotin therapy and /or ingesting biotin supplements may contain high levels of biotin.  The higher biotin concentration in these specimens interferes with this Free T4 assay.  Specimens that contain high levels  of biotin may cause false high results for this Free T4 assay.  Please interpret results in light of the total clinical presentation of the patient.    Marland Kitchen TSH 05/17/2017 1.93  0.35 - 4.50 uIU/mL Final    Allergies as of 05/20/2017      Reactions   Vibramycin [doxycycline Calcium] Rash      Medication List       Accurate as of 05/20/17  4:06 PM. Always use your most recent med list.          calcium carbonate 500 MG chewable tablet Commonly known as:  TUMS - dosed in mg elemental calcium Chew 2 tablets by mouth daily.   eletriptan 40 MG tablet Commonly known as:  RELPAX One tablet by mouth at onset of headache. May repeat in 2 hours if headache persists or recurs.   hydrocortisone 10 MG  tablet Commonly known as:  CORTEF TAKE 2 TABLETS BY MOUTH EVERY MORNING AND 1 AT NIGHT   ketorolac 10 MG tablet Commonly known as:  TORADOL Take 10 mg by mouth every 6 (six) hours as needed for severe pain.   levothyroxine 25 MCG tablet Commonly known as:  SYNTHROID, LEVOTHROID TAKE 1 TABLET BY MOUTH DAILY BEFORE BREAKFAST   MULTIVITAMIN ADULT Chew Chew 1 each by mouth daily.   naproxen sodium 220 MG tablet Commonly known as:  ANAPROX Take 660 mg by mouth as needed (pain).       Allergies:  Allergies  Allergen Reactions  . Vibramycin [Doxycycline Calcium] Rash    Past Medical History:  Diagnosis Date  . Allergy   . Hypoadrenalism (HCC)   . Pituitary abnormality [facility])     Past Surgical History:    Procedure Laterality Date  . ABDOMINAL HYSTERECTOMY     Partial  . CHOLECYSTECTOMY    . rotator repair    . TONSILLECTOMY    . TUBAL LIGATION      Family History  Problem Relation Age of Onset  . Congestive Heart Failure Mother   . Thyroid disease Mother   . Hypertension Mother   . Cancer Father        pancreatic  . Hypertension Father   . Diabetes Maternal Grandmother     Social History:  reports that she has been smoking.  She has never used smokeless tobacco. She reports that she drinks alcohol. She reports that she does not use drugs.   REVIEW Of SYSTEMS:  She has a previous history of migraine headaches, taking Relpax as needed  She had been previously advised to take vitamin D 1000 units empirically for supplementation and osteoporosis prevention.    Examination:   BP 114/68   Pulse 71   Ht 5\' 3"  (1.6 m)   Wt 153 lb 12.8 oz (69.8 kg)   SpO2 97%   BMI 27.24 kg/m   She looks well, no central obesity Standing blood pressure not low at 120/68  No edema  Assessment/Plan:   1. Secondary adrenal insufficiency, long-standing and stable.  Clinically doing well with no symptoms of adrenal insufficiency Also no signs of excess replacement of cortisone Her weight is slightly lower this time Blood pressure is not low standing up Electrolytes are normal She is taking her hydrocortisone very regularly except occasionally on the weekend Discussed the need to take at least 2-3 times the regular dose when she is having any acute illness  She will call if she starts feeling lightheaded on standing up  2.  Mild primary hypothyroidism without a goiter.  She is not complaining of fatigue and continues on 25 g as before TSH is again normal as also free T4 and she will continue the same dose   Follow-up in one year  [facility] 05/20/2017, 4:06 PM

## 2017-05-21 ENCOUNTER — Ambulatory Visit: Payer: 59 | Admitting: Endocrinology

## 2017-08-21 ENCOUNTER — Other Ambulatory Visit: Payer: Self-pay | Admitting: Endocrinology

## 2017-12-07 ENCOUNTER — Other Ambulatory Visit: Payer: Self-pay | Admitting: Endocrinology

## 2018-05-16 ENCOUNTER — Other Ambulatory Visit (INDEPENDENT_AMBULATORY_CARE_PROVIDER_SITE_OTHER): Payer: Managed Care, Other (non HMO)

## 2018-05-16 DIAGNOSIS — E2749 Other adrenocortical insufficiency: Secondary | ICD-10-CM

## 2018-05-16 DIAGNOSIS — E038 Other specified hypothyroidism: Secondary | ICD-10-CM

## 2018-05-16 LAB — BASIC METABOLIC PANEL
BUN: 15 mg/dL (ref 6–23)
CO2: 31 mEq/L (ref 19–32)
Calcium: 10 mg/dL (ref 8.4–10.5)
Chloride: 101 mEq/L (ref 96–112)
Creatinine, Ser: 0.76 mg/dL (ref 0.40–1.20)
GFR: 81.56 mL/min (ref >60.00)
Glucose, Bld: 101 mg/dL — ABNORMAL HIGH (ref 70–99)
Potassium: 3.8 mEq/L (ref 3.5–5.1)
Sodium: 138 mEq/L (ref 135–145)

## 2018-05-16 LAB — TSH: TSH: 1.73 u[IU]/mL (ref 0.35–4.50)

## 2018-05-17 LAB — T4, FREE: Free T4: 1.01 ng/dL (ref 0.60–1.60)

## 2018-05-19 ENCOUNTER — Ambulatory Visit: Payer: 59 | Admitting: Endocrinology

## 2018-05-23 ENCOUNTER — Ambulatory Visit (INDEPENDENT_AMBULATORY_CARE_PROVIDER_SITE_OTHER): Payer: Managed Care, Other (non HMO) | Admitting: Endocrinology

## 2018-05-23 ENCOUNTER — Encounter: Payer: Self-pay | Admitting: Endocrinology

## 2018-05-23 VITALS — BP 124/72 | HR 78 | Ht 63.0 in | Wt 142.0 lb

## 2018-05-23 DIAGNOSIS — E2749 Other adrenocortical insufficiency: Secondary | ICD-10-CM | POA: Diagnosis not present

## 2018-05-23 DIAGNOSIS — E038 Other specified hypothyroidism: Secondary | ICD-10-CM

## 2018-05-23 NOTE — Progress Notes (Signed)
Elizabeth Lamb 62 y.o.   Chief complaint: Followup of adrenal insufficiency and hypothyroidism    History of Present Illness:    1. Patient has had secondary adrenal insufficiency since 07/2008 when she was admitted to the hospital with a sodium of 121. She also has a partial empty sella but no other pituitary hormone deficiencies  She does take her medication at the same times, usually takes it that 7:00 and 3:30 p.m. She thinks if she takes her afternoon dose after 4 PM she may have difficulty falling asleep  Overall she feels fairly good and has no fatigue, nausea, lightheadedness or weakness She thinks she has lost weight because of less stress and watching her diet better She has been monitoring her blood pressure readings only rarely at work now  Electrolytes are normal   2. Primary hypothyroidism: In 2014 she had a TSH level of 5.9 checked incidentally.  Free T4 has been consistently normal previously She was not complaining of unusual fatigue, cold intolerance, hair loss or dry skin She does have family history of hypothyroidism The patient wanted to empirically try thyroid supplement in 5/14 and has been taking 25 mcg daily of levothyroxine since then With starting this she felt that her energy level improved    Not complaining of any fatigue recently Has been quite regular with taking her levothyroxine on waking up daily TSH is again normal with her dose of 25 g    Wt Readings from Last 3 Encounters:  05/23/18 142 lb (64.4 kg)  05/20/17 153 lb 12.8 oz (69.8 kg)  11/20/16 156 lb (70.8 kg)    No visits with results within 1 Week(s) from this visit.  Latest known visit with results is:  Lab on 05/16/2018  Component Date Value Ref Range Status  . Free T4 05/16/2018 1.01  0.60 - 1.60 ng/dL Final   Comment: Specimens from patients who are undergoing biotin therapy and /or ingesting biotin supplements may contain high levels of biotin.  The higher  biotin concentration in these specimens interferes with this Free T4 assay.  Specimens that contain high levels  of biotin may cause false high results for this Free T4 assay.  Please interpret results in light of the total clinical presentation of the patient.    Marland Kitchen TSH 05/16/2018 1.73  0.35 - 4.50 uIU/mL Final  . Sodium 05/16/2018 138  135 - 145 mEq/L Final  . Potassium 05/16/2018 3.8  3.5 - 5.1 mEq/L Final  . Chloride 05/16/2018 101  96 - 112 mEq/L Final  . CO2 05/16/2018 31  19 - 32 mEq/L Final  . Glucose, Bld 05/16/2018 101* 70 - 99 mg/dL Final  . BUN 16/06/9603 15  6 - 23 mg/dL Final  . Creatinine, Ser 05/16/2018 0.76  0.40 - 1.20 mg/dL Final  . Calcium 54/05/8118 10.0  8.4 - 10.5 mg/dL Final  . GFR 14/78/2956 81.56  >60.00 mL/min Final    Allergies as of 05/23/2018      Reactions   Vibramycin [doxycycline Calcium] Rash      Medication List        Accurate as of 05/23/18  3:36 PM. Always use your most recent med list.          calcium carbonate 500 MG chewable tablet Commonly known as:  TUMS - dosed in mg elemental calcium Chew 2 tablets by mouth daily.   eletriptan 40 MG tablet Commonly known as:  RELPAX One tablet by mouth at onset  of headache. May repeat in 2 hours if headache persists or recurs.   hydrocortisone 10 MG tablet Commonly known as:  CORTEF TAKE 2 TABLETS BY MOUTH EVERY MORNING AND 1 AT NIGHT   hydrocortisone 10 MG tablet Commonly known as:  CORTEF TAKE 2 TABLETS BY MOUTH EVERY MORNING AND 1 TABLET AT NIGHT   ketorolac 10 MG tablet Commonly known as:  TORADOL Take 10 mg by mouth every 6 (six) hours as needed for severe pain.   levothyroxine 25 MCG tablet Commonly known as:  SYNTHROID, LEVOTHROID TAKE 1 TABLET BY MOUTH DAILY BEFORE BREAKFAST   MULTIVITAMIN ADULT Chew Chew 1 each by mouth daily.   naproxen sodium 220 MG tablet Commonly known as:  ALEVE Take 660 mg by mouth as needed (pain).       Allergies:  Allergies  Allergen Reactions   . Vibramycin [Doxycycline Calcium] Rash    Past Medical History:  Diagnosis Date  . Allergy   . Hypoadrenalism (HCC)   . Pituitary abnormality [facility](HCC)     Past Surgical History:  Procedure Laterality Date  . ABDOMINAL HYSTERECTOMY     Partial  . CHOLECYSTECTOMY    . rotator repair    . TONSILLECTOMY    . TUBAL LIGATION      Family History  Problem Relation Age of Onset  . Congestive Heart Failure Mother   . Thyroid disease Mother   . Hypertension Mother   . Cancer Father        pancreatic  . Hypertension Father   . Diabetes Maternal Grandmother     Social History:  reports that she has been smoking. She has never used smokeless tobacco. She reports that she drinks alcohol. She reports that she does not use drugs.   REVIEW Of SYSTEMS:  She has a previous history of migraine headaches, taking Relpax as needed  Currently does not have a PCP   Examination:   BP 116/68 (BP Location: Left Arm, Patient Position: Standing, Cuff Size: Normal)   Pulse 78   Ht 5\' 3"  (1.6 m)   Wt 142 lb (64.4 kg)   SpO2 96%   BMI 25.15 kg/m   She looks well No supraclavicular fat pads Thyroid not palpable Standing blood pressure normal   Assessment/Plan:   1. Secondary adrenal insufficiency, long-standing and stable.  Clinically doing well with no symptoms of adrenal insufficiency and she is on the full replacement doses  Not sure if she has complete or partial adrenal insufficiency Since she tends to have some side effects of insomnia with taking her evening hydrocortisone she can try taking 5 mg instead of 10 as long and she is subjectively feels fairly good  Also currently no signs of excess replacement of cortisone   2.  Mild primary hypothyroidism without a goiter.  She has been doing a replacement with 25 mcg of levothyroxine Subjectively doing well and her thyroid functions are consistently normal now   Follow-up in one year  Reather Littlerjay Elson Ulbrich 05/23/2018, 3:36 PM

## 2018-05-23 NOTE — Patient Instructions (Signed)
1/2 Hydrocortisone in pm

## 2018-05-24 ENCOUNTER — Encounter: Payer: Self-pay | Admitting: Endocrinology

## 2018-07-16 ENCOUNTER — Other Ambulatory Visit: Payer: Self-pay | Admitting: Endocrinology

## 2018-12-09 ENCOUNTER — Ambulatory Visit: Payer: Self-pay | Admitting: Nurse Practitioner

## 2018-12-09 VITALS — BP 122/60 | HR 70 | Temp 98.6°F | Resp 14 | Wt 140.6 lb

## 2018-12-09 DIAGNOSIS — J209 Acute bronchitis, unspecified: Secondary | ICD-10-CM

## 2018-12-09 MED ORDER — AZITHROMYCIN 250 MG PO TABS: ORAL_TABLET | ORAL | 0 refills | Status: DC

## 2018-12-09 MED ORDER — BENZONATATE 200 MG PO CAPS: 200.0000 mg | ORAL_CAPSULE | Freq: Two times a day (BID) | ORAL | 0 refills | Status: DC | PRN

## 2018-12-09 NOTE — Progress Notes (Signed)
Subjective:     Elizabeth Lamb is a 63 y.o. female here for evaluation of a cough.  The cough is without wheezing, dyspnea or hemoptysis, productive of clear sputum and is aggravated by nothing. Onset of symptoms was 10 days ago, waxes and wanes since that time.  Associated symptoms include none. Patient denies fever, chills, fatigue, shortness of breath, difficulty breathing, trouble breathing, or wheezing.  Patient states cough is sometimes dry and sometimes productive of white sputum, mostly in the morning, but does occur during the day. There has been no change in the amount of sputum production or color. Patient does not have a history of asthma. The patient informs she has been smoking approximately 3/4 ppd for 30 years.  The patient also has not had a flu shot this year.  Patient has not had recent travel or known exposure to anyone with COVID-19. Patient does have a history of smoking. Patient  has not had a previous chest x-ray.  Patient has been taking Robitussin and Mucinex with mild relief.  Patient takes steroids daily for adrenal insufficiency.  The following portions of the patient's history were reviewed and updated as appropriate: allergies, current medications and past medical history.  Review of Systems Constitutional: negative Eyes: negative Ears, nose, mouth, throat, and face: negative Respiratory: positive for cough, negative for asthma, chronic bronchitis, dyspnea on exertion, pleurisy/chest pain, pneumonia, sputum, stridor and wheezing Cardiovascular: negative Gastrointestinal: negative Neurological: negative     Objective:   BP 122/60   Pulse 70   Temp 98.6 F (37 C)   Resp 14   Wt 140 lb 9.6 oz (63.8 kg)   SpO2 98%   BMI 24.91 kg/m   Physical Exam Vitals signs reviewed.  Constitutional:      General: She is not in acute distress. HENT:     Head: Normocephalic.     Right Ear: Tympanic membrane, ear canal and external ear normal.     Left Ear: Tympanic  membrane, ear canal and external ear normal.     Nose: Mucosal edema present.     Right Turbinates: Enlarged and swollen.     Left Turbinates: Enlarged and swollen.     Right Sinus: No maxillary sinus tenderness or frontal sinus tenderness.     Left Sinus: No maxillary sinus tenderness or frontal sinus tenderness.     Mouth/Throat:     Lips: Pink.     Mouth: Mucous membranes are moist.     Pharynx: Oropharynx is clear. Uvula midline. Posterior oropharyngeal erythema ( 2/2 cough) present. No pharyngeal swelling, oropharyngeal exudate or uvula swelling.     Tonsils: No tonsillar exudate. Swelling: 0 on the right. 0 on the left.  Eyes:     Pupils: Pupils are equal, round, and reactive to light.  Neck:     Musculoskeletal: Normal range of motion and neck supple.  Cardiovascular:     Rate and Rhythm: Normal rate and regular rhythm.     Pulses: Normal pulses.     Heart sounds: Normal heart sounds.  Pulmonary:     Effort: Pulmonary effort is normal. No respiratory distress.     Breath sounds: Normal breath sounds. No stridor. No wheezing, rhonchi or rales.  Abdominal:     General: Bowel sounds are normal.     Palpations: Abdomen is soft.     Tenderness: There is no abdominal tenderness.  Lymphadenopathy:     Cervical: No cervical adenopathy.  Skin:    General: Skin is warm  and dry.  Neurological:     General: No focal deficit present.     Mental Status: She is alert and oriented to person, place, and time.     Cranial Nerves: No cranial nerve deficit.  Psychiatric:        Mood and Affect: Mood normal.        Behavior: Behavior normal.     Assessment:    Acute Bronchitis    Plan:   Exam findings, diagnosis etiology and medication use and indications reviewed with patient. Follow- Up and discharge instructions provided. No emergent/urgent issues found on exam.Based on the patient's physical assessment and symptoms, I am going to treat the patient prophylactically for a bacterial  lung infection.  Patient has a long smoking history and is of advanced aged, which makes her high-risk..  The patient has continued to smoke since her symptom onset so there is a likelihood for COPD. The patient currently takes cortef and was instructed to increase her dose to 40mg  in the am and 20mg  at bedtime per her PCP when she is ill. Patient was instructed to increase her dose for the next 5 days, then resume her usual dose. The patient was also provided symptomatic treatment with benzonatate for her cough.  The patient is well-appearing, is in no acute distress and vitals are stable at this time.  Patient education was provided. Patient verbalized understanding of information provided and agrees with plan of care (POC), all questions answered. The patient is advised to call or return to clinic if condition does not see an improvement in symptoms, or to seek the care of the closest emergency department if condition worsens with the above plan.   1. Acute bronchitis, unspecified organism  - azithromycin (ZITHROMAX) 250 MG tablet; Take as directed.  Dispense: 6 tablet; Refill: 0 - benzonatate (TESSALON) 200 MG capsule; Take 1 capsule (200 mg total) by mouth 2 (two) times daily as needed for cough.  Dispense: 20 capsule; Refill: 0 -Take medication as prescribed. -Adjust the dosing of the Hydro-cortef as directed by your previous doctor for sick days.  You should take 40mg  in the morning and 20mg  at bedtime for 5 days as he has instructed. -Ibuprofen or Tylenol for pain, fever, or general discomfort. -Increase fluids. -Get plenty of rest. -Sleep elevated on at least 2 pillows at bedtime to help with cough. -Use a humidifier or vaporizer when at home and during sleep to help with cough. -May use a teaspoon of honey or over-the-counter cough drops to help with cough. -Your physical exam and symptoms do not indicate symptoms of COVID-19 at this time.  -Follow-up with your PCP if your symptoms do not  improve or if you develop fever, chills, shortness of breath or difficulty breathing.

## 2018-12-09 NOTE — Patient Instructions (Addendum)
Acute Bronchitis, Adult -Take medication as prescribed. -Adjust the dosing of the Hydro-cortef as directed by your previous doctor for sick days.  You should take 40mg  in the morning and 20mg  at bedtime for 5 days as he has instructed. -Ibuprofen or Tylenol for pain, fever, or general discomfort. -Increase fluids. -Get plenty of rest. -Sleep elevated on at least 2 pillows at bedtime to help with cough. -Use a humidifier or vaporizer when at home and during sleep to help with cough. -May use a teaspoon of honey or over-the-counter cough drops to help with cough. -Your physical exam and symptoms do not indicate symptoms of COVID-19 at this time.  -Follow-up with your PCP if your symptoms do not improve or if you develop fever, chills, shortness of breath or difficulty breathing.   Acute bronchitis is sudden (acute) swelling of the air tubes (bronchi) in the lungs. Acute bronchitis causes these tubes to fill with mucus, which can make it hard to breathe. It can also cause coughing or wheezing. In adults, acute bronchitis usually goes away within 2 weeks. A cough caused by bronchitis may last up to 3 weeks. Smoking, allergies, and asthma can make the condition worse. Repeated episodes of bronchitis may cause further lung problems, such as chronic obstructive pulmonary disease (COPD). What are the causes? This condition can be caused by germs and by substances that irritate the lungs, including:  Cold and flu viruses. This condition is most often caused by the same virus that causes a cold.  Bacteria.  Exposure to tobacco smoke, dust, fumes, and air pollution. What increases the risk? This condition is more likely to develop in people who:  Have close contact with someone with acute bronchitis.  Are exposed to lung irritants, such as tobacco smoke, dust, fumes, and vapors.  Have a weak immune system.  Have a respiratory condition such as asthma. What are the signs or symptoms? Symptoms  of this condition include:  A cough.  Coughing up clear, yellow, or green mucus.  Wheezing.  Chest congestion.  Shortness of breath.  A fever.  Body aches.  Chills.  A sore throat. How is this diagnosed? This condition is usually diagnosed with a physical exam. During the exam, your health care provider may order tests, such as chest X-rays, to rule out other conditions. He or she may also:  Test a sample of your mucus for bacterial infection.  Check the level of oxygen in your blood. This is done to check for pneumonia.  Do a chest X-ray or lung function testing to rule out pneumonia and other conditions.  Perform blood tests. Your health care provider will also ask about your symptoms and medical history. How is this treated? Most cases of acute bronchitis clear up over time without treatment. Your health care provider may recommend:  Drinking more fluids. Drinking more makes your mucus thinner, which may make it easier to breathe.  Taking a medicine for a fever or cough.  Taking an antibiotic medicine.  Using an inhaler to help improve shortness of breath and to control a cough.  Using a cool mist vaporizer or humidifier to make it easier to breathe. Follow these instructions at home: Medicines  Take over-the-counter and prescription medicines only as told by your health care provider.  If you were prescribed an antibiotic, take it as told by your health care provider. Do not stop taking the antibiotic even if you start to feel better. General instructions   Get plenty of rest.  Drink enough fluids to keep your urine pale yellow.  Avoid smoking and secondhand smoke. Exposure to cigarette smoke or irritating chemicals will make bronchitis worse. If you smoke and you need help quitting, ask your health care provider. Quitting smoking will help your lungs heal faster.  Use an inhaler, cool mist vaporizer, or humidifier as told by your health care provider.   Keep all follow-up visits as told by your health care provider. This is important. How is this prevented? To lower your risk of getting this condition again:  Wash your hands often with soap and water. If soap and water are not available, use hand sanitizer.  Avoid contact with people who have cold symptoms.  Try not to touch your hands to your mouth, nose, or eyes.  Make sure to get the flu shot every year. Contact a health care provider if:  Your symptoms do not improve in 2 weeks of treatment. Get help right away if:  You cough up blood.  You have chest pain.  You have severe shortness of breath.  You become dehydrated.  You faint or keep feeling like you are going to faint.  You keep vomiting.  You have a severe headache.  Your fever or chills gets worse. This information is not intended to replace advice given to you by your health care provider. Make sure you discuss any questions you have with your health care provider. Document Released: 10/22/2004 Document Revised: 04/28/2017 Document Reviewed: 03/04/2016 Elsevier Interactive Patient Education  2019 [employer].

## 2018-12-12 ENCOUNTER — Telehealth: Payer: Self-pay

## 2018-12-12 NOTE — Telephone Encounter (Signed)
Patient states she is well, her cough improved. She appreciated the way she was treated her by all the staff.

## 2019-02-14 ENCOUNTER — Telehealth: Payer: Self-pay | Admitting: Endocrinology

## 2019-02-14 ENCOUNTER — Other Ambulatory Visit: Payer: Self-pay | Admitting: Endocrinology

## 2019-02-14 NOTE — Telephone Encounter (Signed)
Called pt and made her aware. Verbalized acceptance and understanding. 

## 2019-02-14 NOTE — Telephone Encounter (Signed)
Please advise if refill is appropriate 

## 2019-02-14 NOTE — Telephone Encounter (Signed)
She only needs to increase her hydrocortisone if she is having any fever

## 2019-02-14 NOTE — Telephone Encounter (Signed)
Patient states that she currently has lime disease from a tick bite and they have put her on amoxicillin. She has currently has become very stressed and would like to know if she can double up on her hydrocortisone.  Please Advise, Thanks

## 2019-02-14 NOTE — Telephone Encounter (Signed)
Please advise 

## 2019-03-13 DIAGNOSIS — M1812 Unilateral primary osteoarthritis of first carpometacarpal joint, left hand: Secondary | ICD-10-CM | POA: Insufficient documentation

## 2019-03-13 DIAGNOSIS — M79645 Pain in left finger(s): Secondary | ICD-10-CM | POA: Insufficient documentation

## 2019-05-15 ENCOUNTER — Other Ambulatory Visit: Payer: Managed Care, Other (non HMO)

## 2019-05-15 ENCOUNTER — Other Ambulatory Visit: Payer: 59

## 2019-05-17 ENCOUNTER — Ambulatory Visit: Payer: 59 | Admitting: Endocrinology

## 2019-05-26 ENCOUNTER — Other Ambulatory Visit (INDEPENDENT_AMBULATORY_CARE_PROVIDER_SITE_OTHER): Payer: Managed Care, Other (non HMO)

## 2019-05-26 ENCOUNTER — Other Ambulatory Visit: Payer: Self-pay

## 2019-05-26 ENCOUNTER — Other Ambulatory Visit: Payer: Self-pay | Admitting: Endocrinology

## 2019-05-26 DIAGNOSIS — Z13220 Encounter for screening for lipoid disorders: Secondary | ICD-10-CM

## 2019-05-26 DIAGNOSIS — E2749 Other adrenocortical insufficiency: Secondary | ICD-10-CM

## 2019-05-26 DIAGNOSIS — E038 Other specified hypothyroidism: Secondary | ICD-10-CM

## 2019-05-26 NOTE — Addendum Note (Signed)
Addended by: Kaylyn Lim I on: 05/26/2019 04:14 PM   Modules accepted: Orders

## 2019-05-26 NOTE — Addendum Note (Signed)
Addended by: STONE-ELMORE, General Wearing I on: 05/26/2019 04:14 PM   Modules accepted: Orders  

## 2019-05-26 NOTE — Addendum Note (Signed)
Addended by: STONE-ELMORE, Jarelly Rinck I on: 05/26/2019 04:14 PM   Modules accepted: Orders  

## 2019-05-26 NOTE — Addendum Note (Signed)
Addended by: STONE-ELMORE, Shenna Brissette I on: 05/26/2019 04:14 PM   Modules accepted: Orders  

## 2019-05-27 LAB — TSH: TSH: 2.34 u[IU]/mL (ref 0.450–4.500)

## 2019-05-27 LAB — COMPREHENSIVE METABOLIC PANEL
ALT: 18 IU/L (ref 0–32)
AST: 34 IU/L (ref 0–40)
Albumin/Globulin Ratio: 2.4 — ABNORMAL HIGH (ref 1.2–2.2)
Albumin: 4.3 g/dL (ref 3.8–4.8)
Alkaline Phosphatase: 59 IU/L (ref 39–117)
BUN/Creatinine Ratio: 18 (ref 12–28)
BUN: 12 mg/dL (ref 8–27)
Bilirubin Total: 0.2 mg/dL (ref 0.0–1.2)
CO2: 26 mmol/L (ref 20–29)
Calcium: 9.9 mg/dL (ref 8.7–10.3)
Chloride: 99 mmol/L (ref 96–106)
Creatinine, Ser: 0.68 mg/dL (ref 0.57–1.00)
GFR calc Af Amer: 107 mL/min/{1.73_m2} (ref >59)
GFR calc non Af Amer: 93 mL/min/{1.73_m2} (ref >59)
Globulin, Total: 1.8 g/dL (ref 1.5–4.5)
Glucose: 85 mg/dL (ref 65–99)
Potassium: 4.4 mmol/L (ref 3.5–5.2)
Sodium: 139 mmol/L (ref 134–144)
Total Protein: 6.1 g/dL (ref 6.0–8.5)

## 2019-05-27 LAB — LIPID PANEL
Chol/HDL Ratio: 3.3 ratio (ref 0.0–4.4)
Cholesterol, Total: 219 mg/dL — ABNORMAL HIGH (ref 100–199)
HDL: 67 mg/dL (ref >39)
LDL Calculated: 130 mg/dL — ABNORMAL HIGH (ref 0–99)
Triglycerides: 109 mg/dL (ref 0–149)
VLDL Cholesterol Cal: 22 mg/dL (ref 5–40)

## 2019-05-27 LAB — T4, FREE: Free T4: 1.35 ng/dL (ref 0.82–1.77)

## 2019-05-30 ENCOUNTER — Ambulatory Visit: Payer: Managed Care, Other (non HMO) | Admitting: Endocrinology

## 2019-06-20 ENCOUNTER — Encounter: Payer: Self-pay | Admitting: Endocrinology

## 2019-06-20 ENCOUNTER — Ambulatory Visit (INDEPENDENT_AMBULATORY_CARE_PROVIDER_SITE_OTHER): Payer: Managed Care, Other (non HMO) | Admitting: Endocrinology

## 2019-06-20 ENCOUNTER — Other Ambulatory Visit: Payer: Self-pay

## 2019-06-20 VITALS — BP 90/60 | HR 79 | Ht 63.0 in | Wt 144.6 lb

## 2019-06-20 DIAGNOSIS — E038 Other specified hypothyroidism: Secondary | ICD-10-CM | POA: Diagnosis not present

## 2019-06-20 DIAGNOSIS — Z23 Encounter for immunization: Secondary | ICD-10-CM

## 2019-06-20 DIAGNOSIS — E7800 Pure hypercholesterolemia, unspecified: Secondary | ICD-10-CM

## 2019-06-20 DIAGNOSIS — E2749 Other adrenocortical insufficiency: Secondary | ICD-10-CM | POA: Diagnosis not present

## 2019-06-20 NOTE — Progress Notes (Signed)
Elizabeth Lamb 63 y.o.   Chief complaint: Followup of adrenal insufficiency and hypothyroidism    History of Present Illness:    1. Patient has had secondary adrenal insufficiency since 07/2008 when she was admitted to the hospital with a sodium of 121. She also has a partial empty sella but no other pituitary hormone deficiencies  She does take her hydrocortisone at the same times, usually takes 20 mg at 7:00 a.m. and 10 mg at about 3:30 p.m. She thinks if she takes her afternoon dose after 4 PM she may have difficulty falling asleep However now is using melatonin for sleep  No recent complaints of fatigue, nausea, lightheadedness or weakness No significant weight change   She has been monitoring her blood pressure readings occasionally  Home readings ranging as follows: 102-120/64-76  Electrolytes are normal   2. Primary hypothyroidism: In 2014 she had a TSH level of 5.9 checked incidentally.  Free T4 has been consistently normal previously She was not complaining of unusual fatigue, cold intolerance, hair loss or dry skin She does have family history of hypothyroidism The patient wanted to empirically try thyroid supplement in 5/14 She has been taking 25 mcg daily of levothyroxine since then with initial improvement in her energy level  Recently has no fatigue  Has been quite regular with taking her levothyroxine on waking up daily TSH is again normal with her dose of 25 g, free T4 also normal    Wt Readings from Last 3 Encounters:  06/20/19 144 lb 9.6 oz (65.6 kg)  12/09/18 140 lb 9.6 oz (63.8 kg)  05/23/18 142 lb (64.4 kg)    No visits with results within 1 Week(s) from this visit.  Latest known visit with results is:  Lab on 05/26/2019  Component Date Value Ref Range Status  . Glucose 05/26/2019 85  65 - 99 mg/dL Final  . BUN 71/24/5809 12  8 - 27 mg/dL Final  . Creatinine, Ser 05/26/2019 0.68  0.57 - 1.00 mg/dL Final  . GFR calc non Af  Amer 05/26/2019 93  >59 mL/min/1.73 Final  . GFR calc Af Amer 05/26/2019 107  >59 mL/min/1.73 Final  . BUN/Creatinine Ratio 05/26/2019 18  12 - 28 Final  . Sodium 05/26/2019 139  134 - 144 mmol/L Final  . Potassium 05/26/2019 4.4  3.5 - 5.2 mmol/L Final  . Chloride 05/26/2019 99  96 - 106 mmol/L Final  . CO2 05/26/2019 26  20 - 29 mmol/L Final  . Calcium 05/26/2019 9.9  8.7 - 10.3 mg/dL Final  . Total Protein 05/26/2019 6.1  6.0 - 8.5 g/dL Final  . Albumin 98/33/8250 4.3  3.8 - 4.8 g/dL Final  . Globulin, Total 05/26/2019 1.8  1.5 - 4.5 g/dL Final  . Albumin/Globulin Ratio 05/26/2019 2.4* 1.2 - 2.2 Final  . Bilirubin Total 05/26/2019 0.2  0.0 - 1.2 mg/dL Final  . Alkaline Phosphatase 05/26/2019 59  39 - 117 IU/L Final  . AST 05/26/2019 34  0 - 40 IU/L Final  . ALT 05/26/2019 18  0 - 32 IU/L Final  . TSH 05/26/2019 2.340  0.450 - 4.500 uIU/mL Final  . Free T4 05/26/2019 1.35  0.82 - 1.77 ng/dL Final  . Cholesterol, Total 05/26/2019 219* 100 - 199 mg/dL Final  . Triglycerides 05/26/2019 109  0 - 149 mg/dL Final  . HDL 53/97/6734 67  >39 mg/dL Final  . VLDL Cholesterol Cal 05/26/2019 22  5 - 40 mg/dL Final  .  LDL Calculated 05/26/2019 130* 0 - 99 mg/dL Final   Comment: **Effective May 29, 2019, [facility] is implementing an improved** equation to calculate Low Density Lipoprotein Cholesterol (LDL-C) concentrations, to be used in all lipid panels that report calculated LDL-C. This equation was developed through a collaboration with the [employer], Lung and [place] (NIH).[1] The NIH calculation overcomes the limitations of the existing [place] LDL-C equation and performs equally well in both fasting and non-fasting individuals. 1. Pauline Good Q, et al. A new equation for calculation of low-density lipoprotein cholesterol in patients with normolipidemia and/or hypertriglyceridemia. JAMA Cardiol. 2020 Feb 26. doi:10.1001/jamacardio.2020.0013   .  Chol/HDL Ratio 05/26/2019 3.3  0.0 - 4.4 ratio Final   Comment:                                   T. Chol/HDL Ratio                                             Men  Women                               1/2 Avg.Risk  3.4    3.3                                   Avg.Risk  5.0    4.4                                2X Avg.Risk  9.6    7.1                                3X Avg.Risk 23.4   11.0     Allergies as of 06/20/2019      Reactions   Vibramycin [doxycycline Calcium] Rash      Medication List       Accurate as of June 20, 2019  3:04 PM. If you have any questions, ask your nurse or doctor.        STOP taking these medications   azithromycin 250 MG tablet Commonly known as: ZITHROMAX Stopped by: Elayne Snare, MD   benzonatate 200 MG capsule Commonly known as: TESSALON Stopped by: Elayne Snare, MD     TAKE these medications   calcium carbonate 500 MG chewable tablet Commonly known as: TUMS - dosed in mg elemental calcium Chew 2 tablets by mouth daily.   eletriptan 40 MG tablet Commonly known as: Relpax One tablet by mouth at onset of headache. May repeat in 2 hours if headache persists or recurs.   hydrocortisone 10 MG tablet Commonly known as: CORTEF TAKE 2 TABLETS BY MOUTH EVERY MORNING AND 1 AT NIGHT   hydrocortisone 10 MG tablet Commonly known as: CORTEF TAKE 2 TABLETS BY MOUTH EVERY MORNING AND 1 TABLET AT NIGHT   ketorolac 10 MG tablet Commonly known as: TORADOL Take 10 mg by mouth every 6 (six) hours as needed for severe pain.   levothyroxine 25 MCG tablet Commonly known as: SYNTHROID TAKE 1 TABLET BY  MOUTH DAILY BEFORE BREAKFAST   Multivitamin Adult Chew Chew 1 each by mouth daily.   naproxen sodium 220 MG tablet Commonly known as: ALEVE Take 660 mg by mouth as needed (pain).       Allergies:  Allergies  Allergen Reactions  . Vibramycin [Doxycycline Calcium] Rash    Past Medical History:  Diagnosis Date  . Allergy   . Hypoadrenalism  (HCC)   . 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 ABDOMINAL HYSTERECTOMY     Partial  . CHOLECYSTECTOMY    . rotator repair    . TONSILLECTOMY    . TUBAL LIGATION      Family History  Problem Relation Age of Onset  . Congestive Heart Failure Mother   . Thyroid disease Mother   . Hypertension Mother   . Cancer Father        pancreatic  . Hypertension Father   . Diabetes Maternal Grandmother     Social History:  reports that she has been smoking. She has never used smokeless tobacco. She reports current alcohol use. She reports that she does not use drugs.   REVIEW Of SYSTEMS:  She has a previous history of migraine headaches, taking Relpax as needed  She is planning to establish with a PCP  However she wants pneumonia shot today   Examination:   BP 90/60 (BP Location: Left Arm, Patient Position: Standing, Cuff Size: Normal)   Pulse 79   Ht 5\' 3"  (1.6 m)   Wt 144 lb 9.6 oz (65.6 kg)   SpO2 96%   BMI 25.61 kg/m   She looks well  Standing blood pressure 90/60  Assessment/Plan:   1. Secondary adrenal insufficiency, long-standing and stable.  Clinically doing well with no recurrence of symptoms of adrenal insufficiency Also no unusual weight gain or signs or symptoms of corticosteroid excess  She will continue her regimen unchanged  However since she may not have complete corticosteroid deficiency she can try taking only 5 mg hydrocortisone in the afternoon and also recommend that she try to take it later at 4 or 5:00 instead of 3  Again reminded her to call if she starts having lightheadedness on standing up To take stress doses if she has any acute illness   2.  Mild primary hypothyroidism without a goiter.  She has been on a consistent dose of 25 mcg of levothyroxine, initially felt subjectively better when starting this  She feels well Her thyroid functions are consistently normal and she can continue same  dose  Discussed lipid results, she has LDL of 130 which is borderline and since she has no other risk factors needs to be working on diet with reduction of saturated fat mostly   Follow-up in one year  Influenza vaccine and Prevnar given, she will discuss other preventive measures with PCP when she is established   06/20/2019, 3:04 PM

## 2019-06-24 ENCOUNTER — Other Ambulatory Visit: Payer: Self-pay | Admitting: Endocrinology

## 2019-07-12 ENCOUNTER — Other Ambulatory Visit: Payer: Self-pay | Admitting: Endocrinology

## 2020-02-16 ENCOUNTER — Other Ambulatory Visit: Payer: Self-pay | Admitting: Endocrinology

## 2020-06-17 ENCOUNTER — Other Ambulatory Visit: Payer: Managed Care, Other (non HMO)

## 2020-06-17 ENCOUNTER — Ambulatory Visit: Payer: Managed Care, Other (non HMO) | Admitting: Endocrinology

## 2020-06-21 ENCOUNTER — Telehealth: Payer: Self-pay | Admitting: Endocrinology

## 2020-06-21 ENCOUNTER — Other Ambulatory Visit: Payer: Managed Care, Other (non HMO)

## 2020-06-21 NOTE — Telephone Encounter (Signed)
Notified pt  Dr. Lucianne Muss Advise. Pt understood without questions.

## 2020-06-21 NOTE — Telephone Encounter (Signed)
Please advise 

## 2020-06-21 NOTE — Telephone Encounter (Signed)
Depends on how severe her infection is, if it is mild only 3 days otherwise at least 5 days

## 2020-06-21 NOTE — Telephone Encounter (Signed)
Patient is on Amoxicillin and per patient Dr Lucianne Muss has advised her in the past to double her Hydrocortisone when on antibiotics.  Patient is asking how long she should double the medication?  Please call and advise at [phone]

## 2020-06-24 ENCOUNTER — Ambulatory Visit: Payer: Managed Care, Other (non HMO) | Admitting: Endocrinology

## 2020-06-25 ENCOUNTER — Other Ambulatory Visit: Payer: Self-pay | Admitting: Endocrinology

## 2020-07-07 ENCOUNTER — Other Ambulatory Visit: Payer: Self-pay | Admitting: Endocrinology

## 2020-07-07 DIAGNOSIS — E038 Other specified hypothyroidism: Secondary | ICD-10-CM

## 2020-07-07 DIAGNOSIS — E2749 Other adrenocortical insufficiency: Secondary | ICD-10-CM

## 2020-07-08 ENCOUNTER — Other Ambulatory Visit: Payer: Self-pay

## 2020-07-08 ENCOUNTER — Other Ambulatory Visit (INDEPENDENT_AMBULATORY_CARE_PROVIDER_SITE_OTHER): Payer: Managed Care, Other (non HMO)

## 2020-07-08 DIAGNOSIS — E2749 Other adrenocortical insufficiency: Secondary | ICD-10-CM

## 2020-07-08 DIAGNOSIS — E038 Other specified hypothyroidism: Secondary | ICD-10-CM | POA: Diagnosis not present

## 2020-07-08 LAB — BASIC METABOLIC PANEL
BUN: 11 mg/dL (ref 6–23)
CO2: 29 mEq/L (ref 19–32)
Calcium: 9.9 mg/dL (ref 8.4–10.5)
Chloride: 98 mEq/L (ref 96–112)
Creatinine, Ser: 0.68 mg/dL (ref 0.40–1.20)
GFR: 91.37 mL/min (ref >60.00)
Glucose, Bld: 91 mg/dL (ref 70–99)
Potassium: 3.8 mEq/L (ref 3.5–5.1)
Sodium: 135 mEq/L (ref 135–145)

## 2020-07-09 LAB — TSH: TSH: 2.29 u[IU]/mL (ref 0.35–4.50)

## 2020-07-09 LAB — T4, FREE: Free T4: 0.92 ng/dL (ref 0.60–1.60)

## 2020-07-12 ENCOUNTER — Ambulatory Visit: Payer: Managed Care, Other (non HMO) | Admitting: Endocrinology

## 2020-07-16 ENCOUNTER — Ambulatory Visit: Payer: Managed Care, Other (non HMO) | Admitting: Endocrinology

## 2020-07-17 ENCOUNTER — Ambulatory Visit (INDEPENDENT_AMBULATORY_CARE_PROVIDER_SITE_OTHER): Payer: Managed Care, Other (non HMO) | Admitting: Endocrinology

## 2020-07-17 ENCOUNTER — Encounter: Payer: Self-pay | Admitting: Endocrinology

## 2020-07-17 ENCOUNTER — Other Ambulatory Visit: Payer: Self-pay

## 2020-07-17 VITALS — BP 98/70 | HR 76 | Wt 139.4 lb

## 2020-07-17 DIAGNOSIS — E2749 Other adrenocortical insufficiency: Secondary | ICD-10-CM | POA: Diagnosis not present

## 2020-07-17 DIAGNOSIS — Z23 Encounter for immunization: Secondary | ICD-10-CM | POA: Diagnosis not present

## 2020-07-17 DIAGNOSIS — E038 Other specified hypothyroidism: Secondary | ICD-10-CM

## 2020-07-17 NOTE — Progress Notes (Signed)
Elizabeth Lamb 63 y.o.   Chief complaint: Followup of adrenal insufficiency and hypothyroidism    History of Present Illness:    1. Patient has had secondary adrenal insufficiency since 07/2008 when she was admitted to the hospital with a sodium of 121. She also has a partial empty sella but no other pituitary hormone deficiencies  She does take her hydrocortisone at the same times, usually takes 20 mg at 7:00 a.m. and 10 mg in the late afternoon She thinks if she takes her afternoon dose after 4 PM she may have difficulty falling asleep She has not missed any doses  She does not feel weak or nauseated and has no change in appetite She had lost weight in December last year and has been able to maintain it lower which she prefers   She has been monitoring her blood pressure readings occasionally  Home readings ranging as follows: 102-120/70-76  Electrolytes are normal   2. Primary hypothyroidism: In 2014 she had a TSH level of 5.9 checked incidentally.  Free T4 has been consistently normal previously She was not complaining of unusual fatigue, cold intolerance, hair loss or dry skin She does have family history of hypothyroidism The patient wanted to empirically try thyroid supplement in 5/14  She has been taking 25 mcg daily of levothyroxine since then with initial improvement in her energy level  She feels well  Has been quite regular with taking her levothyroxine before breakfast TSH is again normal with her dose of 25 g levothyroxine    Wt Readings from Last 3 Encounters:  07/17/20 139 lb 6.4 oz (63.2 kg)  06/20/19 144 lb 9.6 oz (65.6 kg)  12/09/18 140 lb 9.6 oz (63.8 kg)    No visits with results within 1 Week(s) from this visit.  Latest known visit with results is:  Lab on 07/08/2020  Component Date Value Ref Range Status  . Free T4 07/08/2020 0.92  0.60 - 1.60 ng/dL Final   Comment: Specimens from patients who are undergoing biotin therapy  and /or ingesting biotin supplements may contain high levels of biotin.  The higher biotin concentration in these specimens interferes with this Free T4 assay.  Specimens that contain high levels  of biotin may cause false high results for this Free T4 assay.  Please interpret results in light of the total clinical presentation of the patient.    Marland Kitchen TSH 07/08/2020 2.29  0.35 - 4.50 uIU/mL Final  . Sodium 07/08/2020 135  135 - 145 mEq/L Final  . Potassium 07/08/2020 3.8  3.5 - 5.1 mEq/L Final  . Chloride 07/08/2020 98  96 - 112 mEq/L Final  . CO2 07/08/2020 29  19 - 32 mEq/L Final  . Glucose, Bld 07/08/2020 91  70 - 99 mg/dL Final  . BUN 03/21/7627 11  6 - 23 mg/dL Final  . Creatinine, Ser 07/08/2020 0.68  0.40 - 1.20 mg/dL Final  . GFR 31/51/7616 91.37  >60.00 mL/min Final  . Calcium 07/08/2020 9.9  8.4 - 10.5 mg/dL Final    Allergies as of 07/17/2020      Reactions   Vibramycin [doxycycline Calcium] Rash      Medication List       Accurate as of July 17, 2020  1:47 PM. If you have any questions, ask your nurse or doctor.        calcium carbonate 500 MG chewable tablet Commonly known as: TUMS - dosed in mg elemental calcium Chew 2 tablets  by mouth daily.   eletriptan 40 MG tablet Commonly known as: RELPAX TAKE 1 TABLET BY MOUTH AT ONSET OF HEADACHE.  MAY REPEAT IN 2 HOURS IF HEADACHE PERSISTS OR RECURS   hydrocortisone 20 MG tablet Commonly known as: CORTEF TAKE 1 TABLET BY MOUTH EVERY MORNING, AND 1/2 AT BEDTIME   ketorolac 10 MG tablet Commonly known as: TORADOL Take 10 mg by mouth every 6 (six) hours as needed for severe pain.   levothyroxine 25 MCG tablet Commonly known as: SYNTHROID TAKE 1 TABLET BY MOUTH DAILY BEFORE BREAKFAST   Multivitamin Adult Chew Chew 1 each by mouth daily.   naproxen sodium 220 MG tablet Commonly known as: ALEVE Take 660 mg by mouth as needed (pain).       Allergies:  Allergies  Allergen Reactions  . Vibramycin [Doxycycline  Calcium] Rash    Past Medical History:  Diagnosis Date  . Allergy   . Hypoadrenalism (HCC)   . Pituitary abnormality [facility])     Past Surgical History:  Procedure Laterality Date  . ABDOMINAL HYSTERECTOMY     Partial  . CHOLECYSTECTOMY    . rotator repair    . TONSILLECTOMY    . TUBAL LIGATION      Family History  Problem Relation Age of Onset  . Congestive Heart Failure Mother   . Thyroid disease Mother   . Hypertension Mother   . Cancer Father        pancreatic  . Hypertension Father   . Diabetes Maternal Grandmother     Social History:  reports that she has been smoking. She has never used smokeless tobacco. She reports current alcohol use. She reports that she does not use drugs.   REVIEW Of SYSTEMS:  She has a previous history of migraine headaches, taking Relpax as needed  She is going to be seen by Dr. Chilton Si as new PCP     Examination:   BP 98/70   Pulse 76   Wt 139 lb 6.4 oz (63.2 kg)   SpO2 98%   BMI 24.69 kg/m   Thyroid not palpable   Assessment/Plan:   1. Secondary adrenal insufficiency, long-standing and stable.  Etiology likely empty sella syndrome  Clinically doing well again and has had no symptoms of adrenal insufficiency, low blood pressure or low sodium levels She will stay on the 20 mg in the morning and 10 mg in the late afternoon as before  2.  Mild primary hypothyroidism without a goiter.  She has been on a consistent dose of 25 mcg of levothyroxine, initially felt subjectively better when starting this  No complaints of fatigue Her thyroid functions are again normal and she can continue same dose   Follow-up in one year     Reather Littler 07/17/2020, 1:47 PM

## 2020-08-26 ENCOUNTER — Other Ambulatory Visit: Payer: Self-pay | Admitting: Endocrinology

## 2020-12-12 ENCOUNTER — Other Ambulatory Visit: Payer: Self-pay | Admitting: Endocrinology

## 2021-01-28 ENCOUNTER — Ambulatory Visit: Payer: Managed Care, Other (non HMO) | Admitting: Dermatology

## 2021-03-03 DIAGNOSIS — M79645 Pain in left finger(s): Secondary | ICD-10-CM | POA: Diagnosis not present

## 2021-03-03 DIAGNOSIS — M1812 Unilateral primary osteoarthritis of first carpometacarpal joint, left hand: Secondary | ICD-10-CM | POA: Diagnosis not present

## 2021-03-03 DIAGNOSIS — M1832 Unilateral post-traumatic osteoarthritis of first carpometacarpal joint, left hand: Secondary | ICD-10-CM | POA: Diagnosis not present

## 2021-04-09 ENCOUNTER — Other Ambulatory Visit: Payer: Self-pay | Admitting: Endocrinology

## 2021-07-10 ENCOUNTER — Other Ambulatory Visit: Payer: Self-pay | Admitting: Endocrinology

## 2021-07-14 ENCOUNTER — Other Ambulatory Visit: Payer: Managed Care, Other (non HMO)

## 2021-07-17 ENCOUNTER — Ambulatory Visit: Payer: Managed Care, Other (non HMO) | Admitting: Endocrinology

## 2021-07-30 DIAGNOSIS — H52223 Regular astigmatism, bilateral: Secondary | ICD-10-CM | POA: Diagnosis not present

## 2021-07-31 ENCOUNTER — Other Ambulatory Visit: Payer: Self-pay | Admitting: Endocrinology

## 2021-07-31 ENCOUNTER — Other Ambulatory Visit: Payer: Self-pay

## 2021-07-31 ENCOUNTER — Other Ambulatory Visit (INDEPENDENT_AMBULATORY_CARE_PROVIDER_SITE_OTHER): Payer: Medicare HMO

## 2021-07-31 DIAGNOSIS — E2749 Other adrenocortical insufficiency: Secondary | ICD-10-CM | POA: Diagnosis not present

## 2021-07-31 DIAGNOSIS — E038 Other specified hypothyroidism: Secondary | ICD-10-CM

## 2021-07-31 LAB — BASIC METABOLIC PANEL
BUN: 15 mg/dL (ref 6–23)
CO2: 27 mEq/L (ref 19–32)
Calcium: 9.5 mg/dL (ref 8.4–10.5)
Chloride: 102 mEq/L (ref 96–112)
Creatinine, Ser: 0.64 mg/dL (ref 0.40–1.20)
GFR: 91.9 mL/min (ref >60.00)
Glucose, Bld: 83 mg/dL (ref 70–99)
Potassium: 4 mEq/L (ref 3.5–5.1)
Sodium: 138 mEq/L (ref 135–145)

## 2021-07-31 LAB — TSH: TSH: 2.46 u[IU]/mL (ref 0.35–5.50)

## 2021-07-31 LAB — T4, FREE: Free T4: 0.92 ng/dL (ref 0.60–1.60)

## 2021-08-04 ENCOUNTER — Encounter: Payer: Self-pay | Admitting: Endocrinology

## 2021-08-04 ENCOUNTER — Other Ambulatory Visit: Payer: Self-pay

## 2021-08-04 ENCOUNTER — Ambulatory Visit (INDEPENDENT_AMBULATORY_CARE_PROVIDER_SITE_OTHER): Payer: Self-pay | Admitting: Endocrinology

## 2021-08-04 VITALS — BP 110/64 | HR 91 | Ht 64.0 in | Wt 137.6 lb

## 2021-08-04 DIAGNOSIS — E038 Other specified hypothyroidism: Secondary | ICD-10-CM

## 2021-08-04 DIAGNOSIS — E2749 Other adrenocortical insufficiency: Secondary | ICD-10-CM

## 2021-08-04 NOTE — Progress Notes (Signed)
Elizabeth Lamb 65 y.o.   Chief complaint: Followup of adrenal insufficiency and hypothyroidism    History of Present Illness:    1. Patient has had secondary adrenal insufficiency since 07/2008 when she was admitted to the hospital with a sodium of 121. She also has a partial empty sella but no other pituitary hormone deficiencies  She does take her hydrocortisone daily at the same times, usually takes 20 mg at about 5 a.m. and 10 mg between 2 and 3 PM She thinks if she takes her afternoon dose after 4 PM she may have difficulty falling asleep She has not missed any doses  Lately she has felt good with no fatigue, weakness or nausea Her weight is about the same   Blood pressure is being checked at home only when she is having any symptoms, not recently Electrolytes are normal   2. Primary hypothyroidism: In 2014 she had a TSH level of 5.9 checked incidentally.  Free T4 has been consistently normal previously She was not complaining of unusual fatigue, cold intolerance, hair loss or dry skin She does have family history of hypothyroidism The patient wanted to empirically try thyroid supplement in 5/14  She has been taking 25 mcg daily of levothyroxine since then with initial improvement in her energy level  She does not complain of fatigue now   Has been quite regular with taking her levothyroxine before breakfast TSH is again normal with her dose of 25 g levothyroxine    Wt Readings from Last 3 Encounters:  08/04/21 137 lb 9.6 oz (62.4 kg)  07/17/20 139 lb 6.4 oz (63.2 kg)  06/20/19 144 lb 9.6 oz (65.6 kg)    Lab on 07/31/2021  Component Date Value Ref Range Status   Free T4 07/31/2021 0.92  0.60 - 1.60 ng/dL Final   Comment: Specimens from patients who are undergoing biotin therapy and /or ingesting biotin supplements may contain high levels of biotin.  The higher biotin concentration in these specimens interferes with this Free T4 assay.  Specimens  that contain high levels  of biotin may cause false high results for this Free T4 assay.  Please interpret results in light of the total clinical presentation of the patient.     TSH 07/31/2021 2.46  0.35 - 5.50 uIU/mL Final   Sodium 07/31/2021 138  135 - 145 mEq/L Final   Potassium 07/31/2021 4.0  3.5 - 5.1 mEq/L Final   Chloride 07/31/2021 102  96 - 112 mEq/L Final   CO2 07/31/2021 27  19 - 32 mEq/L Final   Glucose, Bld 07/31/2021 83  70 - 99 mg/dL Final   BUN 64/15/8309 15  6 - 23 mg/dL Final   Creatinine, Ser 07/31/2021 0.64  0.40 - 1.20 mg/dL Final   GFR 40/76/8088 91.90  >60.00 mL/min Final   Calculated using the CKD-EPI Creatinine Equation (2021)   Calcium 07/31/2021 9.5  8.4 - 10.5 mg/dL Final    Allergies as of 08/04/2021       Reactions   Vibramycin [doxycycline Calcium] Rash        Medication List        Accurate as of August 04, 2021  4:03 PM. If you have any questions, ask your nurse or doctor.          calcium carbonate 500 MG chewable tablet Commonly known as: TUMS - dosed in mg elemental calcium Chew 2 tablets by mouth daily.   eletriptan 40 MG tablet Commonly known  as: RELPAX TAKE 1 TABLET BY MOUTH AT ONSET OF HEADACHE. MAY REPEAT IN 2 HOURS IF HEADACHE PERSISTS OR RECURS   HYDROcodone-acetaminophen 5-325 MG tablet Commonly known as: NORCO/VICODIN Take by mouth.   hydrocortisone 20 MG tablet Commonly known as: CORTEF TAKE 1 TABLET BY MOUTH EVERY MORNING, AND 1/2 AT BEDTIME   ketorolac 10 MG tablet Commonly known as: TORADOL Take 10 mg by mouth every 6 (six) hours as needed for severe pain.   levothyroxine 25 MCG tablet Commonly known as: SYNTHROID TAKE 1 TABLET BY MOUTH DAILY BEFORE BREAKFAST   Multivitamin Adult Chew Chew 1 each by mouth daily.   naproxen sodium 220 MG tablet Commonly known as: ALEVE Take 660 mg by mouth as needed (pain).        Allergies:  Allergies  Allergen Reactions   Vibramycin [Doxycycline Calcium] Rash     Past Medical History:  Diagnosis Date   Allergy    Hypoadrenalism (HCC)    Pituitary abnormality (HCC)     Past Surgical History:  Procedure Laterality Date   ABDOMINAL HYSTERECTOMY     Partial   CHOLECYSTECTOMY     rotator repair     TONSILLECTOMY     TUBAL LIGATION      Family History  Problem Relation Age of Onset   Congestive Heart Failure Mother    Thyroid disease Mother    Hypertension Mother    Cancer Father        pancreatic   Hypertension Father    Diabetes Maternal Grandmother     Social History:  reports that she has been smoking. She has never used smokeless tobacco. She reports current alcohol use. She reports that she does not use drugs.   REVIEW Of SYSTEMS:  She has a previous history of migraine headaches, taking Relpax as needed  She is going to be seen by Dr. Chilton Si as new PCP     Examination:   BP 110/64 (Patient Position: Standing)   Pulse 91   Ht 5\' 4"  (1.626 m)   Wt 137 lb 9.6 oz (62.4 kg)   SpO2 96%   BMI 23.62 kg/m   Thyroid not enlarged Skin appears normal   Assessment/Plan:   Secondary adrenal insufficiency, long-standing and representing isolated hormonal pituitary deficiency.   Etiology has been likely empty sella syndrome  She is on long-term hydrocortisone supplementation with the usual therapeutic doses  Recently doing well subjectively and has had no symptoms of adrenal insufficiency, orthostatic decline in Blood pressure or low sodium levels She will stay on the 20 mg in the morning and 10 mg in the late afternoon as before   Mild primary hypothyroidism without a goiter.  She has been on a consistent dose of 25 mcg of levothyroxine, initially felt subjectively better when starting this  She has no fatigue lately and TSH is normal as before  Her thyroid functions are again normal and she can continue same dose Follow-up annually      08/04/2021, 4:03 PM

## 2021-08-07 DIAGNOSIS — M1812 Unilateral primary osteoarthritis of first carpometacarpal joint, left hand: Secondary | ICD-10-CM | POA: Diagnosis not present

## 2021-08-07 DIAGNOSIS — M1832 Unilateral post-traumatic osteoarthritis of first carpometacarpal joint, left hand: Secondary | ICD-10-CM | POA: Diagnosis not present

## 2021-08-07 DIAGNOSIS — M79645 Pain in left finger(s): Secondary | ICD-10-CM | POA: Diagnosis not present

## 2021-08-19 ENCOUNTER — Other Ambulatory Visit: Payer: Self-pay | Admitting: Endocrinology

## 2021-09-25 ENCOUNTER — Other Ambulatory Visit: Payer: Self-pay | Admitting: Endocrinology

## 2021-10-28 ENCOUNTER — Other Ambulatory Visit: Payer: Self-pay | Admitting: Endocrinology

## 2021-11-08 DIAGNOSIS — R059 Cough, unspecified: Secondary | ICD-10-CM | POA: Diagnosis not present

## 2021-11-08 DIAGNOSIS — M791 Myalgia, unspecified site: Secondary | ICD-10-CM | POA: Diagnosis not present

## 2021-11-08 DIAGNOSIS — R0981 Nasal congestion: Secondary | ICD-10-CM | POA: Diagnosis not present

## 2021-11-08 DIAGNOSIS — J0100 Acute maxillary sinusitis, unspecified: Secondary | ICD-10-CM | POA: Diagnosis not present

## 2021-11-08 DIAGNOSIS — Z20828 Contact with and (suspected) exposure to other viral communicable diseases: Secondary | ICD-10-CM | POA: Diagnosis not present

## 2021-11-08 DIAGNOSIS — J029 Acute pharyngitis, unspecified: Secondary | ICD-10-CM | POA: Diagnosis not present

## 2021-11-11 DIAGNOSIS — J209 Acute bronchitis, unspecified: Secondary | ICD-10-CM | POA: Diagnosis not present

## 2021-11-11 DIAGNOSIS — R0981 Nasal congestion: Secondary | ICD-10-CM | POA: Diagnosis not present

## 2021-11-11 DIAGNOSIS — R059 Cough, unspecified: Secondary | ICD-10-CM | POA: Diagnosis not present

## 2021-11-11 DIAGNOSIS — Z20828 Contact with and (suspected) exposure to other viral communicable diseases: Secondary | ICD-10-CM | POA: Diagnosis not present

## 2021-11-12 ENCOUNTER — Telehealth: Payer: Self-pay

## 2021-11-12 NOTE — Telephone Encounter (Signed)
Patient left vm stating she was diagnosed with sinusitis and bronchitis and has temp. Last time she had this you increased her hydrocortisone and wants to know what to increase to. Please advise.

## 2021-11-28 ENCOUNTER — Other Ambulatory Visit: Payer: Self-pay | Admitting: Endocrinology

## 2021-12-25 DIAGNOSIS — M79645 Pain in left finger(s): Secondary | ICD-10-CM | POA: Diagnosis not present

## 2021-12-25 DIAGNOSIS — M1812 Unilateral primary osteoarthritis of first carpometacarpal joint, left hand: Secondary | ICD-10-CM | POA: Diagnosis not present

## 2021-12-25 DIAGNOSIS — M1832 Unilateral post-traumatic osteoarthritis of first carpometacarpal joint, left hand: Secondary | ICD-10-CM | POA: Diagnosis not present

## 2022-01-12 ENCOUNTER — Other Ambulatory Visit: Payer: Self-pay | Admitting: Endocrinology

## 2022-02-19 ENCOUNTER — Other Ambulatory Visit: Payer: Self-pay | Admitting: Endocrinology

## 2022-03-01 DIAGNOSIS — M25562 Pain in left knee: Secondary | ICD-10-CM | POA: Diagnosis not present

## 2022-03-03 DIAGNOSIS — S8002XA Contusion of left knee, initial encounter: Secondary | ICD-10-CM | POA: Diagnosis not present

## 2022-03-08 DIAGNOSIS — M7662 Achilles tendinitis, left leg: Secondary | ICD-10-CM | POA: Diagnosis not present

## 2022-05-28 DIAGNOSIS — M1812 Unilateral primary osteoarthritis of first carpometacarpal joint, left hand: Secondary | ICD-10-CM | POA: Diagnosis not present

## 2022-05-28 DIAGNOSIS — M1832 Unilateral post-traumatic osteoarthritis of first carpometacarpal joint, left hand: Secondary | ICD-10-CM | POA: Diagnosis not present

## 2022-06-02 ENCOUNTER — Ambulatory Visit: Payer: Medicare HMO | Admitting: Orthopedic Surgery

## 2022-06-10 ENCOUNTER — Other Ambulatory Visit: Payer: Self-pay | Admitting: Endocrinology

## 2022-06-15 ENCOUNTER — Ambulatory Visit: Payer: Medicare HMO | Admitting: Orthopedic Surgery

## 2022-06-20 DIAGNOSIS — R519 Headache, unspecified: Secondary | ICD-10-CM | POA: Diagnosis not present

## 2022-06-23 ENCOUNTER — Telehealth: Payer: Self-pay

## 2022-06-23 NOTE — Telephone Encounter (Signed)
Patient called in states that she tested positive for COVID. She is currently molnupiravir 200mg .Will stop taking it on Thursday. She has been taking 40mg  in morning and 20mg  in evening of hydrocortisone. Wants to know how long you want her double up on hydrocortisone medication.

## 2022-06-29 ENCOUNTER — Encounter: Payer: Self-pay | Admitting: Orthopedic Surgery

## 2022-06-29 ENCOUNTER — Ambulatory Visit: Payer: Medicare HMO | Admitting: Orthopedic Surgery

## 2022-06-29 DIAGNOSIS — M205X1 Other deformities of toe(s) (acquired), right foot: Secondary | ICD-10-CM

## 2022-06-29 NOTE — Progress Notes (Signed)
Office Visit Note   Patient: Elizabeth Lamb           Date of Birth: 06-Aug-1956           MRN: [id number] Visit Date: 06/29/2022              Requested by: Shade Flood, MD [address] [place],  [place] [address] PCP: Shade Flood, MD  Chief Complaint  Patient presents with   Left Foot - Pain      HPI: Patient is a 66 year old woman who has developed progressive clawing of the third toe right foot.  Patient is currently wearing a Dr. Margart Sickles pad.  Assessment & Plan: Visit Diagnoses:  1. Acquired claw toe, right     Plan: Continue with the pressure offloading with the small donuts.  Discussed that if this gets worse the surgical treatment would be a Weil osteotomy and a PIP resection.  Discussed that she would probably have a floating toe after surgery.  Follow-Up Instructions: Return if symptoms worsen or fail to improve.   Ortho Exam  Patient is alert, oriented, no adenopathy, well-dressed, normal affect, normal respiratory effort. Examination patient is a good dorsalis pedis pulse she has good ankle subtalar motion good dorsiflexion of the ankle.  She has fixed clawing of the third and second toe right foot worse in the third toe.  There are no open ulcers but there is a callus over the dorsum of the third toe.  Imaging: No results found. No images are attached to the encounter.  Labs: No results found for: "HGBA1C", "ESRSEDRATE", "CRP", "LABURIC", "REPTSTATUS", "GRAMSTAIN", "CULT", "LABORGA"   Lab Results  Component Value Date   ALBUMIN 4.3 05/26/2019   ALBUMIN 4.2 08/13/2014   ALBUMIN 4.1 05/02/2014    Lab Results  Component Value Date   MG 1.6 07/30/2008   No results found for: "VD25OH"  No results found for: "PREALBUMIN"    07/29/2008   12:00 PM 05/07/2008    1:40 PM  CBC EXTENDED  WBC 8.1  7.2   RBC 4.11  4.27   Hemoglobin 11.8  12.5   HCT 35.2  35.5   Platelets 205  245   NEUT# 4.5  3.0   Lymph# 2.9  3.5      There is no  height or weight on file to calculate BMI.  Orders:  No orders of the defined types were placed in this encounter.  No orders of the defined types were placed in this encounter.    Procedures: No procedures performed  Clinical Data: No additional findings.  ROS:  All other systems negative, except as noted in the HPI. Review of Systems  Objective: Vital Signs: There were no vitals taken for this visit.  Specialty Comments:  No specialty comments available.  PMFS History: Patient Active Problem List   Diagnosis Date Noted   Unspecified hypothyroidism 09/29/2013   Secondary adrenal insufficiency (HCC) 05/17/2013   Past Medical History:  Diagnosis Date   Allergy    Hypoadrenalism (HCC)    Pituitary abnormality (HCC)     Family History  Problem Relation Age of Onset   Congestive Heart Failure Mother    Thyroid disease Mother    Hypertension Mother    Cancer Father        pancreatic   Hypertension Father    Diabetes Maternal Grandmother     Past Surgical History:  Procedure Laterality Date   ABDOMINAL HYSTERECTOMY  Partial   CHOLECYSTECTOMY     rotator repair     TONSILLECTOMY     TUBAL LIGATION     Social History   Occupational History   Not on file  Tobacco Use   Smoking status: Every Day   Smokeless tobacco: Never  Substance and Sexual Activity   Alcohol use: Yes   Drug use: No   Sexual activity: Yes    Birth control/protection: Surgical

## 2022-06-29 NOTE — Telephone Encounter (Signed)
Tried calling patient --no answer

## 2022-06-30 NOTE — Telephone Encounter (Signed)
Was able to reach patient and gave her information. She was a little upset about how long it took to get back to her. Apologized to patient about wait.

## 2022-07-24 ENCOUNTER — Other Ambulatory Visit: Payer: Self-pay | Admitting: Endocrinology

## 2022-07-28 ENCOUNTER — Telehealth: Payer: Self-pay

## 2022-07-28 NOTE — Telephone Encounter (Signed)
Patient called left vm. She has lab tomorrow and wants to know if she can have Hep B added to her blood work

## 2022-07-29 ENCOUNTER — Other Ambulatory Visit (INDEPENDENT_AMBULATORY_CARE_PROVIDER_SITE_OTHER): Payer: Medicare HMO

## 2022-07-29 DIAGNOSIS — Z1159 Encounter for screening for other viral diseases: Secondary | ICD-10-CM | POA: Diagnosis not present

## 2022-07-29 DIAGNOSIS — E038 Other specified hypothyroidism: Secondary | ICD-10-CM

## 2022-07-29 DIAGNOSIS — E2749 Other adrenocortical insufficiency: Secondary | ICD-10-CM | POA: Diagnosis not present

## 2022-07-29 LAB — BASIC METABOLIC PANEL
BUN: 12 mg/dL (ref 6–23)
CO2: 30 mEq/L (ref 19–32)
Calcium: 9.6 mg/dL (ref 8.4–10.5)
Chloride: 99 mEq/L (ref 96–112)
Creatinine, Ser: 0.69 mg/dL (ref 0.40–1.20)
GFR: 89.62 mL/min (ref >60.00)
Glucose, Bld: 94 mg/dL (ref 70–99)
Potassium: 4.4 mEq/L (ref 3.5–5.1)
Sodium: 136 mEq/L (ref 135–145)

## 2022-07-29 LAB — T4, FREE: Free T4: 0.95 ng/dL (ref 0.60–1.60)

## 2022-07-29 LAB — TSH: TSH: 1.32 u[IU]/mL (ref 0.35–5.50)

## 2022-07-30 ENCOUNTER — Other Ambulatory Visit: Payer: Self-pay | Admitting: Endocrinology

## 2022-07-30 ENCOUNTER — Other Ambulatory Visit: Payer: Self-pay

## 2022-07-30 DIAGNOSIS — Z1159 Encounter for screening for other viral diseases: Secondary | ICD-10-CM

## 2022-07-31 LAB — HEPATITIS B SURFACE ANTIBODY, QUANTITATIVE: Hepatitis B Surf Ab Quant: 206.6 m[IU]/mL (ref >9.9)

## 2022-08-03 ENCOUNTER — Ambulatory Visit: Payer: Medicare HMO | Admitting: Endocrinology

## 2022-08-03 ENCOUNTER — Other Ambulatory Visit: Payer: Medicare HMO

## 2022-08-06 ENCOUNTER — Ambulatory Visit: Payer: Medicare HMO | Admitting: Endocrinology

## 2022-08-06 ENCOUNTER — Other Ambulatory Visit: Payer: Self-pay | Admitting: Endocrinology

## 2022-08-10 DIAGNOSIS — M549 Dorsalgia, unspecified: Secondary | ICD-10-CM | POA: Diagnosis not present

## 2022-08-10 DIAGNOSIS — G8929 Other chronic pain: Secondary | ICD-10-CM | POA: Diagnosis not present

## 2022-08-23 DIAGNOSIS — J069 Acute upper respiratory infection, unspecified: Secondary | ICD-10-CM | POA: Diagnosis not present

## 2022-08-23 DIAGNOSIS — R0981 Nasal congestion: Secondary | ICD-10-CM | POA: Diagnosis not present

## 2022-08-23 DIAGNOSIS — R059 Cough, unspecified: Secondary | ICD-10-CM | POA: Diagnosis not present

## 2022-08-24 DIAGNOSIS — M1832 Unilateral post-traumatic osteoarthritis of first carpometacarpal joint, left hand: Secondary | ICD-10-CM | POA: Diagnosis not present

## 2022-08-24 DIAGNOSIS — M79645 Pain in left finger(s): Secondary | ICD-10-CM | POA: Diagnosis not present

## 2022-08-24 DIAGNOSIS — M1812 Unilateral primary osteoarthritis of first carpometacarpal joint, left hand: Secondary | ICD-10-CM | POA: Diagnosis not present

## 2022-09-11 ENCOUNTER — Ambulatory Visit: Payer: Medicare HMO | Admitting: Endocrinology

## 2022-09-11 ENCOUNTER — Encounter: Payer: Self-pay | Admitting: Endocrinology

## 2022-09-11 VITALS — BP 122/70 | HR 71 | Ht 64.0 in | Wt 141.8 lb

## 2022-09-11 DIAGNOSIS — E2749 Other adrenocortical insufficiency: Secondary | ICD-10-CM

## 2022-09-11 DIAGNOSIS — E038 Other specified hypothyroidism: Secondary | ICD-10-CM | POA: Diagnosis not present

## 2022-09-11 NOTE — Progress Notes (Unsigned)
Elizabeth Lamb 66 y.o.   Chief complaint: Followup of adrenal insufficiency and hypothyroidism    History of Present Illness:    1. Patient has had secondary adrenal insufficiency since 07/2008 when she was admitted to the hospital with a sodium of 121. She also has a partial empty sella but no other pituitary hormone deficiencies  She does take her hydrocortisone daily consistently, usually takes 20 mg at about 5 a.m. and 10 mg between 2 and 3 PM She states if she takes her afternoon dose after 4 PM she may have difficulty falling asleep Recently has not required extra doses for stress or illness  No complaints of fatigue, weakness or nausea Her weight is about the same  Blood pressure is being checked at home on her periodically and usually it is reading about 1 can + and only rarely below that No lightheadedness Electrolytes are normal   2. Primary hypothyroidism: In 2014 she had a TSH level of 5.9 checked incidentally.  Free T4 has been consistently normal previously She was not complaining of unusual fatigue, cold intolerance, hair loss or dry skin She does have family history of hypothyroidism The patient wanted to empirically try thyroid supplement in 5/14  She has been taking 25 mcg daily of levothyroxine since then with initial improvement in her energy level  She does not complain of fatigue again  Has been quite regular with taking her levothyroxine before breakfast TSH is again normal with her dose of 25 g levothyroxine    Wt Readings from Last 3 Encounters:  09/11/22 141 lb 12.8 oz (64.3 kg)  08/04/21 137 lb 9.6 oz (62.4 kg)  07/17/20 139 lb 6.4 oz (63.2 kg)    No visits with results within 1 Week(s) from this visit.  Latest known visit with results is:  Orders Only on 07/30/2022  Component Date Value Ref Range Status   Hepatitis B Surf Ab Quant 07/29/2022 206.6  Immunity>9.9 mIU/mL Final   Comment:   Status of Immunity                      Anti-HBs Level   ------------------                     -------------- Inconsistent with Immunity                   0.0 - 9.9 Consistent with Immunity                          >9.9     Allergies as of 09/11/2022       Reactions   Vibramycin [doxycycline Calcium] Rash        Medication List        Accurate as of September 11, 2022 12:02 PM. If you have any questions, ask your nurse or doctor.          calcium carbonate 500 MG chewable tablet Commonly known as: TUMS - dosed in mg elemental calcium Chew 2 tablets by mouth daily.   eletriptan 40 MG tablet Commonly known as: RELPAX TAKE 1 TABLET BY MOUTH AT ONSET OF HEADACHE. MAY REPEAT IN 2 HOURS IF HEADACHE PERSISTS OR RECURS   HYDROcodone-acetaminophen 5-325 MG tablet Commonly known as: NORCO/VICODIN Take by mouth.   hydrocortisone 20 MG tablet Commonly known as: CORTEF TAKE 1 TABLET BY MOUTH EVERY MORNING, AND 1/2 AT BEDTIME   ketorolac  10 MG tablet Commonly known as: TORADOL Take 10 mg by mouth every 6 (six) hours as needed for severe pain.   levothyroxine 25 MCG tablet Commonly known as: SYNTHROID TAKE 1 TABLET BY MOUTH DAILY BEFORE BREAKFAST   Multivitamin Adult Chew Chew 1 each by mouth daily.   naproxen sodium 220 MG tablet Commonly known as: ALEVE Take 660 mg by mouth as needed (pain).        Allergies:  Allergies  Allergen Reactions   Vibramycin [Doxycycline Calcium] Rash    Past Medical History:  Diagnosis Date   Allergy    Hypoadrenalism (HCC)    Pituitary abnormality (HCC)     Past Surgical History:  Procedure Laterality Date   ABDOMINAL HYSTERECTOMY     Partial   CHOLECYSTECTOMY     rotator repair     TONSILLECTOMY     TUBAL LIGATION      Family History  Problem Relation Age of Onset   Congestive Heart Failure Mother    Thyroid disease Mother    Hypertension Mother    Cancer Father        pancreatic   Hypertension Father    Diabetes Maternal Grandmother      Social History:  reports that she has been smoking. She has never used smokeless tobacco. She reports current alcohol use. She reports that she does not use drugs.   REVIEW Of SYSTEMS:  She has a previous history of migraine headaches, taking Relpax as needed  She is going to be seen by Dr. Chilton Si as new PCP     Examination:   BP 138/70   Pulse 71   Ht 5\' 4"  (1.626 m)   Wt 141 lb 12.8 oz (64.3 kg)   SpO2 95%   BMI 24.34 kg/m   Biceps reflexes show normal relaxation Repeat blood pressure was not low   Assessment/Plan:   Secondary adrenal insufficiency, long-standing and coming from isolated hormonal pituitary deficiency.   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  She has been on a consistent dose of 25 mcg of levothyroxine, initially felt subjectively better when starting this  She has no fatigue lately No change in TSH  Her thyroid functions are again normal and she can continue same dose Follow-up annually as before unless having new problem  Recommended that she follow-up with her PCP for general exam Hepatitis B titer was done at her request and is protective, copy given     09/11/2022, 12:02 PM

## 2022-10-05 ENCOUNTER — Other Ambulatory Visit: Payer: Self-pay

## 2022-10-05 MED ORDER — LEVOTHYROXINE SODIUM 25 MCG PO TABS: 25.0000 ug | ORAL_TABLET | Freq: Every day | ORAL | 3 refills | Status: DC

## 2022-12-31 ENCOUNTER — Other Ambulatory Visit: Payer: Self-pay | Admitting: Endocrinology

## 2023-01-04 ENCOUNTER — Other Ambulatory Visit: Payer: Self-pay | Admitting: Endocrinology

## 2023-01-04 DIAGNOSIS — G43119 Migraine with aura, intractable, without status migrainosus: Secondary | ICD-10-CM | POA: Diagnosis not present

## 2023-01-21 DIAGNOSIS — M1812 Unilateral primary osteoarthritis of first carpometacarpal joint, left hand: Secondary | ICD-10-CM | POA: Diagnosis not present

## 2023-01-21 DIAGNOSIS — M1832 Unilateral post-traumatic osteoarthritis of first carpometacarpal joint, left hand: Secondary | ICD-10-CM | POA: Diagnosis not present

## 2023-02-12 DIAGNOSIS — L821 Other seborrheic keratosis: Secondary | ICD-10-CM | POA: Diagnosis not present

## 2023-02-12 DIAGNOSIS — D2272 Melanocytic nevi of left lower limb, including hip: Secondary | ICD-10-CM | POA: Diagnosis not present

## 2023-02-12 DIAGNOSIS — D692 Other nonthrombocytopenic purpura: Secondary | ICD-10-CM | POA: Diagnosis not present

## 2023-02-12 DIAGNOSIS — D2262 Melanocytic nevi of left upper limb, including shoulder: Secondary | ICD-10-CM | POA: Diagnosis not present

## 2023-02-12 DIAGNOSIS — D225 Melanocytic nevi of trunk: Secondary | ICD-10-CM | POA: Diagnosis not present

## 2023-02-12 DIAGNOSIS — D2271 Melanocytic nevi of right lower limb, including hip: Secondary | ICD-10-CM | POA: Diagnosis not present

## 2023-02-12 DIAGNOSIS — D224 Melanocytic nevi of scalp and neck: Secondary | ICD-10-CM | POA: Diagnosis not present

## 2023-03-05 ENCOUNTER — Other Ambulatory Visit: Payer: Self-pay | Admitting: Endocrinology

## 2023-06-14 ENCOUNTER — Telehealth: Payer: Self-pay | Admitting: Endocrinology

## 2023-06-14 ENCOUNTER — Ambulatory Visit: Payer: Medicare HMO | Admitting: Family Medicine

## 2023-06-14 ENCOUNTER — Other Ambulatory Visit: Payer: Self-pay

## 2023-06-14 DIAGNOSIS — E2749 Other adrenocortical insufficiency: Secondary | ICD-10-CM

## 2023-06-14 MED ORDER — HYDROCORTISONE 20 MG PO TABS: 20.0000 mg | ORAL_TABLET | Freq: Two times a day (BID) | ORAL | 3 refills | Status: DC

## 2023-06-14 NOTE — Telephone Encounter (Signed)
MEDICATION:  hydrocortisone hydrocortisone (CORTEF) 20 MG tablet PHARMACY:    HAS THE PATIENT CONTACTED THEIR PHARMACY?    [facility] - [place], [place] - [address] (Ph: [phone])    IS THIS A 90 DAY SUPPLY : Yes  IS PATIENT OUT OF MEDICATION: Yes  IF NOT; HOW MUCH IS LEFT:   LAST APPOINTMENT DATE: @ 09/11/2022  NEXT APPOINTMENT DATE:@12 /16/2024  DO WE HAVE YOUR PERMISSION TO LEAVE A DETAILED MESSAGE?: Yes  OTHER COMMENTS: Patient states that she called pharmacy last Wednesday 06/09/2023 for refill   **Let patient know to contact pharmacy at the end of the day to make sure medication is ready. **  ** Please notify patient to allow 48-72 hours to process**  **Encourage patient to contact the pharmacy for refills or they can request refills through [facility]**

## 2023-06-14 NOTE — Telephone Encounter (Signed)
MEDICATION:  hydrocortisone hydrocortisone (CORTEF) 20 MG tablet PHARMACY:     HAS THE PATIENT CONTACTED THEIR PHARMACY?    [facility] - [place], [place] - [address] (Ph: [phone])      Hydrocortisone 20 mg sent to [facility] pharmacy

## 2023-06-24 DIAGNOSIS — M1832 Unilateral post-traumatic osteoarthritis of first carpometacarpal joint, left hand: Secondary | ICD-10-CM | POA: Diagnosis not present

## 2023-06-24 DIAGNOSIS — M1812 Unilateral primary osteoarthritis of first carpometacarpal joint, left hand: Secondary | ICD-10-CM | POA: Diagnosis not present

## 2023-06-25 DIAGNOSIS — M65939 Unspecified synovitis and tenosynovitis, unspecified forearm: Secondary | ICD-10-CM | POA: Insufficient documentation

## 2023-06-25 DIAGNOSIS — M65839 Other synovitis and tenosynovitis, unspecified forearm: Secondary | ICD-10-CM | POA: Insufficient documentation

## 2023-06-25 DIAGNOSIS — M13849 Other specified arthritis, unspecified hand: Secondary | ICD-10-CM | POA: Insufficient documentation

## 2023-07-05 ENCOUNTER — Ambulatory Visit: Payer: Medicare HMO | Admitting: Family Medicine

## 2023-07-06 DIAGNOSIS — H9201 Otalgia, right ear: Secondary | ICD-10-CM | POA: Diagnosis not present

## 2023-07-06 DIAGNOSIS — R059 Cough, unspecified: Secondary | ICD-10-CM | POA: Diagnosis not present

## 2023-07-06 DIAGNOSIS — R070 Pain in throat: Secondary | ICD-10-CM | POA: Diagnosis not present

## 2023-07-14 ENCOUNTER — Encounter: Payer: Self-pay | Admitting: Family Medicine

## 2023-07-14 ENCOUNTER — Ambulatory Visit: Payer: Medicare HMO | Admitting: Family Medicine

## 2023-07-14 VITALS — BP 124/62 | HR 70 | Temp 98.0°F | Ht 64.0 in | Wt 151.5 lb

## 2023-07-14 DIAGNOSIS — J069 Acute upper respiratory infection, unspecified: Secondary | ICD-10-CM | POA: Diagnosis not present

## 2023-07-14 DIAGNOSIS — G4700 Insomnia, unspecified: Secondary | ICD-10-CM | POA: Diagnosis not present

## 2023-07-14 DIAGNOSIS — R059 Cough, unspecified: Secondary | ICD-10-CM | POA: Diagnosis not present

## 2023-07-14 DIAGNOSIS — Z87891 Personal history of nicotine dependence: Secondary | ICD-10-CM | POA: Diagnosis not present

## 2023-07-14 MED ORDER — HYDROXYZINE HCL 10 MG PO TABS: 10.0000 mg | ORAL_TABLET | Freq: Every evening | ORAL | 0 refills | Status: DC | PRN

## 2023-07-14 MED ORDER — BENZONATATE 100 MG PO CAPS: 100.0000 mg | ORAL_CAPSULE | Freq: Three times a day (TID) | ORAL | 0 refills | Status: DC | PRN

## 2023-07-14 NOTE — Patient Instructions (Addendum)
Tessalon perles if needed for cough. If not improving in next 5 days, let me know and I can order xray (or sooner if any new or worsening symptoms). Hang in there.   See info on sleep hygiene below. Melatonin if needed, or short term hydroxyzine if needed. Stop if any increased restless leg symptoms and recheck in 1 month.   Congrats on quitting smoking. See info below on coping with quitting smoking. Let me know if I can help.    Cough, Adult Coughing is a reflex that clears your throat and airways (respiratory system). It helps heal and protect your lungs. It is normal to cough from time to time. A cough that happens with other symptoms or that lasts a long time may be a sign of a condition that needs treatment. A short-term (acute) cough may only last 2-3 weeks. A long-term (chronic) cough may last 8 or more weeks. Coughing is often caused by: Diseases, such as: An infection of the respiratory system. Asthma or other heart or lung diseases. Gastroesophageal reflux. This is when acid comes back up from the stomach. Breathing in things that irritate your lungs. Allergies. Postnasal drip. This is when mucus runs down the back of your throat. Smoking. Some medicines. Follow these instructions at home: Medicines Take over-the-counter and prescription medicines only as told by your health care provider. Talk with your provider before you take cough medicine (cough suppressants). Eating and drinking Do not drink alcohol. Avoid caffeine. Drink enough fluid to keep your pee (urine) pale yellow. Lifestyle Avoid cigarette smoke. Do not use any products that contain nicotine or tobacco. These products include cigarettes, chewing tobacco, and vaping devices, such as e-cigarettes. If you need help quitting, ask your provider. Avoid things that make you cough. These may include perfumes, candles, cleaning products, or campfire smoke. General instructions  Watch for any changes to your cough.  Tell your provider about them. Always cover your mouth when you cough. If the air is dry in your bedroom or home, use a cool mist vaporizer or humidifier. If your cough is worse at night, try to sleep in a semi-upright position. Rest as needed. Contact a health care provider if: You have new symptoms, or your symptoms get worse. You cough up pus. You have a fever that does not go away or a cough that does not get better after 2-3 weeks. You cannot control your cough with medicine, and you are losing sleep. You have pain that gets worse or is not helped with medicine. You lose weight for no clear reason. You have night sweats. Get help right away if: You cough up blood. You have trouble breathing. Your heart is beating very fast. These symptoms may be an emergency. Get help right away. Call 911. Do not wait to see if the symptoms will go away. Do not drive yourself to the hospital. This information is not intended to replace advice given to you by your health care provider. Make sure you discuss any questions you have with your health care provider. Document Revised: 05/15/2022 Document Reviewed: 05/15/2022 Elsevier Patient Education  2024 [employer]   Insomnia Insomnia is a sleep disorder that makes it difficult to fall asleep or stay asleep. Insomnia can cause fatigue, low energy, difficulty concentrating, mood swings, and poor performance at work or school. There are three different ways to classify insomnia: Difficulty falling asleep. Difficulty staying asleep. Waking up too early in the morning. Any type of insomnia can be long-term (chronic) or  short-term (acute). Both are common. Short-term insomnia usually lasts for 3 months or less. Chronic insomnia occurs at least three times a week for longer than 3 months. What are the causes? Insomnia may be caused by another condition, situation, or substance, such as: Having certain mental health conditions, such as anxiety and  depression. Using caffeine, alcohol, tobacco, or drugs. Having gastrointestinal conditions, such as gastroesophageal reflux disease (GERD). Having certain medical conditions. These include: Asthma. Alzheimer's disease. Stroke. Chronic pain. An overactive thyroid gland (hyperthyroidism). Other sleep disorders, such as restless legs syndrome and sleep apnea. Menopause. Sometimes, the cause of insomnia may not be known. What increases the risk? Risk factors for insomnia include: Gender. Females are affected more often than males. Age. Insomnia is more common as people get older. Stress and certain medical and mental health conditions. Lack of exercise. Having an irregular work schedule. This may include working night shifts and traveling between different time zones. What are the signs or symptoms? If you have insomnia, the main symptom is having trouble falling asleep or having trouble staying asleep. This may lead to other symptoms, such as: Feeling tired or having low energy. Feeling nervous about going to sleep. Not feeling rested in the morning. Having trouble concentrating. Feeling irritable, anxious, or depressed. How is this diagnosed? This condition may be diagnosed based on: Your symptoms and medical history. Your health care provider may ask about: Your sleep habits. Any medical conditions you have. Your mental health. A physical exam. How is this treated? Treatment for insomnia depends on the cause. Treatment may focus on treating an underlying condition that is causing the insomnia. Treatment may also include: Medicines to help you sleep. Counseling or therapy. Lifestyle adjustments to help you sleep better. Follow these instructions at home: Eating and drinking  Limit or avoid alcohol, caffeinated beverages, and products that contain nicotine and tobacco, especially close to bedtime. These can disrupt your sleep. Do not eat a large meal or eat spicy foods right  before bedtime. This can lead to digestive discomfort that can make it hard for you to sleep. Sleep habits  Keep a sleep diary to help you and your health care provider figure out what could be causing your insomnia. Write down: When you sleep. When you wake up during the night. How well you sleep and how rested you feel the next day. Any side effects of medicines you are taking. What you eat and drink. Make your bedroom a dark, comfortable place where it is easy to fall asleep. Put up shades or blackout curtains to block light from outside. Use a white noise machine to block noise. Keep the temperature cool. Limit screen use before bedtime. This includes: Not watching TV. Not using your smartphone, tablet, or computer. Stick to a routine that includes going to bed and waking up at the same times every day and night. This can help you fall asleep faster. Consider making a quiet activity, such as reading, part of your nighttime routine. Try to avoid taking naps during the day so that you sleep better at night. Get out of bed if you are still awake after 15 minutes of trying to sleep. Keep the lights down, but try reading or doing a quiet activity. When you feel sleepy, go back to bed. General instructions Take over-the-counter and prescription medicines only as told by your health care provider. Exercise regularly as told by your health care provider. However, avoid exercising in the hours right before bedtime. Use relaxation techniques  to manage stress. Ask your health care provider to suggest some techniques that may work well for you. These may include: Breathing exercises. Routines to release muscle tension. Visualizing peaceful scenes. Make sure that you drive carefully. Do not drive if you feel very sleepy. Keep all follow-up visits. This is important. Contact a health care provider if: You are tired throughout the day. You have trouble in your daily routine due to sleepiness. You  continue to have sleep problems, or your sleep problems get worse. Get help right away if: You have thoughts about hurting yourself or someone else. Get help right away if you feel like you may hurt yourself or others, or have thoughts about taking your own life. Go to your nearest emergency room or: Call 911. Call the National Suicide Prevention Lifeline at 714-252-5705 or 988. This is open 24 hours a day. Text the Crisis Text Line at (720)037-0808. Summary Insomnia is a sleep disorder that makes it difficult to fall asleep or stay asleep. Insomnia can be long-term (chronic) or short-term (acute). Treatment for insomnia depends on the cause. Treatment may focus on treating an underlying condition that is causing the insomnia. Keep a sleep diary to help you and your health care provider figure out what could be causing your insomnia. This information is not intended to replace advice given to you by your health care provider. Make sure you discuss any questions you have with your health care provider. Document Revised: 08/25/2021 Document Reviewed: 08/25/2021 Elsevier Patient Education  2024 [employer]   Managing the Challenge of Quitting Smoking Quitting smoking is a physical and mental challenge. You may have cravings, withdrawal symptoms, and temptation to smoke. Before quitting, work with your health care provider to make a plan that can help you manage quitting. Making a plan before you quit may keep you from smoking when you have the urge to smoke while trying to quit. How to manage lifestyle changes Managing stress Stress can make you want to smoke, and wanting to smoke may cause stress. It is important to find ways to manage your stress. You could try some of the following: Practice relaxation techniques. Breathe slowly and deeply, in through your nose and out through your mouth. Listen to music. Soak in a bath or take a shower. Imagine a peaceful place or vacation. Get some  support. Talk with family or friends about your stress. Join a support group. Talk with a counselor or therapist. Get some physical activity. Go for a walk, run, or bike ride. Play a favorite sport. Practice yoga.  Medicines Talk with your health care provider about medicines that might help you deal with cravings and make quitting easier for you. Relationships Social situations can be difficult when you are quitting smoking. To manage this, you can: Avoid parties and other social situations where people might be smoking. Avoid alcohol. Leave right away if you have the urge to smoke. Explain to your family and friends that you are quitting smoking. Ask for support and let them know you might be a bit grumpy. Plan activities where smoking is not an option. General instructions Be aware that many people gain weight after they quit smoking. However, not everyone does. To keep from gaining weight, have a plan in place before you quit, and stick to the plan after you quit. Your plan should include: Eating healthy snacks. When you have a craving, it may help to: Eat popcorn, or try carrots, celery, or other cut vegetables. Chew sugar-free gum.  Changing how you eat. Eat small portion sizes at meals. Eat 4-6 small meals throughout the day instead of 1-2 large meals a day. Be mindful when you eat. You should avoid watching television or doing other things that might distract you as you eat. Exercising regularly. Make time to exercise each day. If you do not have time for a long workout, do short bouts of exercise for 5-10 minutes several times a day. Do some form of strengthening exercise, such as weight lifting. Do some exercise that gets your heart beating and causes you to breathe deeply, such as walking fast, running, swimming, or biking. This is very important. Drinking plenty of water or other low-calorie or no-calorie drinks. Drink enough fluid to keep your urine pale yellow.  How to  recognize withdrawal symptoms Your body and mind may experience discomfort as you try to get used to not having nicotine in your system. These effects are called withdrawal symptoms. They may include: Feeling hungrier than normal. Having trouble concentrating. Feeling irritable or restless. Having trouble sleeping. Feeling depressed. Craving a cigarette. These symptoms may surprise you, but they are normal to have when quitting smoking. To manage withdrawal symptoms: Avoid places, people, and activities that trigger your cravings. Remember why you want to quit. Get plenty of sleep. Avoid coffee and other drinks that contain caffeine. These may worsen some of your symptoms. How to manage cravings Come up with a plan for how to deal with your cravings. The plan should include the following: A definition of the specific situation you want to deal with. An activity or action you will take to replace smoking. A clear idea for how this action will help. The name of someone who could help you with this. Cravings usually last for 5-10 minutes. Consider taking the following actions to help you with your plan to deal with cravings: Keep your mouth busy. Chew sugar-free gum. Suck on hard candies or a straw. Brush your teeth. Keep your hands and body busy. Change to a different activity right away. Squeeze or play with a ball. Do an activity or a hobby, such as making bead jewelry, practicing needlepoint, or working with wood. Mix up your normal routine. Take a short exercise break. Go for a quick walk, or run up and down stairs. Focus on doing something kind or helpful for someone else. Call a friend or family member to talk during a craving. Join a support group. Contact a quitline. Where to find support To get help or find a support group: Call the National [employer] Institute's Smoking Quitline: 1-800-QUIT-NOW (234)812-4614) Text QUIT to SmokefreeTXT: 829562 Where to find more  information Visit these websites to find more information on quitting smoking: [place] Department of Health and Human Services: www.smokefree.gov American Lung Association: www.freedomfromsmoking.org Centers for Disease Control and Prevention (CDC): FootballExhibition.com.br American Heart Association: www.heart.org Contact a health care provider if: You want to change your plan for quitting. The medicines you are taking are not helping. Your eating feels out of control or you cannot sleep. You feel depressed or become very anxious. Summary Quitting smoking is a physical and mental challenge. You will face cravings, withdrawal symptoms, and temptation to smoke again. Preparation can help you as you go through these challenges. Try different techniques to manage stress, handle social situations, and prevent weight gain. You can deal with cravings by keeping your mouth busy (such as by chewing gum), keeping your hands and body busy, calling family or friends, or contacting a quitline for  people who want to quit smoking. You can deal with withdrawal symptoms by avoiding places where people smoke, getting plenty of rest, and avoiding drinks that contain caffeine. This information is not intended to replace advice given to you by your health care provider. Make sure you discuss any questions you have with your health care provider. Document Revised: 09/05/2021 Document Reviewed: 09/05/2021 Elsevier Patient Education  2024 [employer].

## 2023-07-14 NOTE — Progress Notes (Signed)
Subjective:  Patient ID: Elizabeth Lamb, female    DOB: 03-15-1956  Age: 67 y.o. MRN: [id number]  CC:  Chief Complaint  Patient presents with   Cough    Started since her upper respiratory infection, about a week now. Wants something to help her with    HPI Elizabeth Lamb presents for   Cough: URI sx's about 8-9 days ago - started 10/7. St, earache, slight cough. Low grade temp, 100.5. covid test and flu test negative at [facility] on 10/8 (day 2 of symptoms). No repeat covid test since. Treated with Zpak.  No sick contacts. Dry cough, but unable to get rid of it. No shortness of breath.  No fever.  Eating and drinking ok.  No wheeze.  Quit smoking a month ago. Cold [place]. No cravings, doing well. Usually when traveling, but did ok with recent travel. Some weight gain.  No treatments. No insomnia with cough.   Other concerns today: Restless leg sx's 1-2 times per month, take an hour to resolve after walking. Plans to monitor for now.  Insomnia. Few night per week - trouble getting to sleep. Able to maintain sleep once asleep. No hx of OSA.  No attempted tx, other than hydrocodone with hand pain and occasional benadryl 25mg  that has helped. Notices RLS sx's with benadryl.     Plans to reestablish with me as PCP.  Endocrine - Dr. Lucianne Muss.   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 Authorizing Provider  calcium carbonate (TUMS - DOSED  IN MG ELEMENTAL CALCIUM) 500 MG chewable tablet Chew 2 tablets by mouth daily.   Yes [provider]  eletriptan (RELPAX) 40 MG tablet TAKE 1 TABLET BY MOUTH AT ONSET OF HEADACHE. MAY REPEAT IN 2 HOURS IF HEADACHE PERSISTS OR RECURS 01/04/23  Yes Reather Littler, MD  HYDROcodone-acetaminophen (NORCO/VICODIN) 5-325 MG tablet Take by mouth. 05/27/21  Yes [provider]  hydrocortisone (CORTEF) 20 MG tablet Take 1 tablet (20 mg total) by mouth 2 (two) times daily. Take 1 20mg  table in the morning and 1/2 tab at bedtime 06/14/23  Yes Thapa, [place], MD  ketorolac (TORADOL) 10 MG tablet Take 10 mg by mouth every 6 (six) hours as needed for severe pain (pain score 7-10).   Yes [provider]  levothyroxine (SYNTHROID) 25 MCG tablet TAKE 1 TABLET BY MOUTH DAILY BEFORE BREAKFAST 03/06/23  Yes Reather Littler, MD  Multiple Vitamins-Minerals (MULTIVITAMIN ADULT) CHEW Chew 1 each by mouth daily.   Yes [provider]  naproxen sodium (ANAPROX) 220 MG tablet Take 660 mg by mouth as needed (pain).   Yes [provider]   Social History   Socioeconomic History   Marital status: Married    Spouse name: Not on file   Number of children: Not on file   Years of education: Not on file   Highest education  level: Not on file  Occupational History   Not on file  Tobacco Use   Smoking status: Every Day   Smokeless tobacco: Never  Substance and Sexual Activity   Alcohol use: Yes   Drug use: No   Sexual activity: Yes    Birth control/protection: Surgical  Other Topics Concern   Not on file  Social History Narrative   Not on file   Social Determinants of Health   Financial Resource Strain: Not on file  Food Insecurity: Not on file  Transportation Needs: Not on file  Physical Activity: Not on file  Stress: Not on file  Social Connections: Not on file  Intimate Partner Violence: Not on file    Review of Systems Per hPI.   Objective:   Vitals:   07/14/23 1404  BP:  124/62  Pulse: 70  Temp: 98 F (36.7 C)  TempSrc: Temporal  SpO2: 95%  Weight: 151 lb 8 oz (68.7 kg)  Height: 5\' 4"  (1.626 m)     Physical Exam Vitals reviewed.  Constitutional:      General: She is not in acute distress.    Appearance: She is well-developed.  HENT:     Head: Normocephalic and atraumatic.     Right Ear: Hearing, tympanic membrane, ear canal and external ear normal.     Left Ear: Hearing, tympanic membrane, ear canal and external ear normal.     Nose: Nose normal.     Mouth/Throat:     Pharynx: No posterior oropharyngeal erythema.  Eyes:     Conjunctiva/sclera: Conjunctivae normal.     Pupils: Pupils are equal, round, and reactive to light.  Cardiovascular:     Rate and Rhythm: Normal rate and regular rhythm.     Heart sounds: Normal heart sounds. No murmur heard. Pulmonary:     Effort: Pulmonary effort is normal. No respiratory distress.     Breath sounds: Normal breath sounds. No wheezing or rhonchi.     Comments: clear Skin:    General: Skin is warm and dry.     Findings: No rash.  Neurological:     Mental Status: She is alert and oriented to person, place, and time.  Psychiatric:        Mood and Affect: Mood normal.        Behavior: Behavior normal.        Assessment & Plan:  Elizabeth Lamb is a 67 y.o. female . Cough, unspecified type - Plan: benzonatate (TESSALON) 100 MG capsule  -Reassuring exam, likely residual cough from previous viral infection.  Tessalon Perles for now with RTC precautions, option of chest x-ray if not improving into next week.  Upper respiratory tract infection, unspecified type - Plan: benzonatate (TESSALON) 100 MG capsule As above, anticipate continued improvement.   Insomnia, unspecified type - Plan: hydrOXYzine (ATARAX) 10 MG tablet  -Discussed sleep hygiene, handout given, option of over-the-counter melatonin or low-dose hydroxyzine if needed but watch for any increased restless leg syndromes on hydroxyzine as  she reported those have occurred with use of Benadryl.  Follow-up in 1 month.  History of tobacco use Commended on sensation.  Handout given on managing challenges with cessation of smoking.  Meds ordered this encounter  Medications   benzonatate (TESSALON) 100 MG capsule    Sig: Take 1 capsule (100 mg total) by mouth 3 (three) times daily as needed for cough.    Dispense:  20 capsule    Refill:  0   hydrOXYzine (ATARAX) 10  MG tablet    Sig: Take 1 tablet (10 mg total) by mouth at bedtime as needed.    Dispense:  30 tablet    Refill:  0   Patient Instructions  Tessalon perles if needed for cough. If not improving in next 5 days, let me know and I can order xray (or sooner if any new or worsening symptoms). Hang in there.   See info on sleep hygiene below. Melatonin if needed, or short term hydroxyzine if needed. Stop if any increased restless leg symptoms and recheck in 1 month.   Congrats on quitting smoking. See info below on coping with quitting smoking. Let me know if I can help.    Cough, Adult Coughing is a reflex that clears your throat and airways (respiratory system). It helps heal and protect your lungs. It is normal to cough from time to time. A cough that happens with other symptoms or that lasts a long time may be a sign of a condition that needs treatment. A short-term (acute) cough may only last 2-3 weeks. A long-term (chronic) cough may last 8 or more weeks. Coughing is often caused by: Diseases, such as: An infection of the respiratory system. Asthma or other heart or lung diseases. Gastroesophageal reflux. This is when acid comes back up from the stomach. Breathing in things that irritate your lungs. Allergies. Postnasal drip. This is when mucus runs down the back of your throat. Smoking. Some medicines. Follow these instructions at home: Medicines Take over-the-counter and prescription medicines only as told by your health care provider. Talk with your  provider before you take cough medicine (cough suppressants). Eating and drinking Do not drink alcohol. Avoid caffeine. Drink enough fluid to keep your pee (urine) pale yellow. Lifestyle Avoid cigarette smoke. Do not use any products that contain nicotine or tobacco. These products include cigarettes, chewing tobacco, and vaping devices, such as e-cigarettes. If you need help quitting, ask your provider. Avoid things that make you cough. These may include perfumes, candles, cleaning products, or campfire smoke. General instructions  Watch for any changes to your cough. Tell your provider about them. Always cover your mouth when you cough. If the air is dry in your bedroom or home, use a cool mist vaporizer or humidifier. If your cough is worse at night, try to sleep in a semi-upright position. Rest as needed. Contact a health care provider if: You have new symptoms, or your symptoms get worse. You cough up pus. You have a fever that does not go away or a cough that does not get better after 2-3 weeks. You cannot control your cough with medicine, and you are losing sleep. You have pain that gets worse or is not helped with medicine. You lose weight for no clear reason. You have night sweats. Get help right away if: You cough up blood. You have trouble breathing. Your heart is beating very fast. These symptoms may be an emergency. Get help right away. Call 911. Do not wait to see if the symptoms will go away. Do not drive yourself to the hospital. This information is not intended to replace advice given to you by your health care provider. Make sure you discuss any questions you have with your health care provider. Document Revised: 05/15/2022 Document Reviewed: 05/15/2022 Elsevier Patient Education  2024 Elsevier Inc.   Insomnia Insomnia is a sleep disorder that makes it difficult to fall asleep or stay asleep. Insomnia can cause fatigue, low energy, difficulty concentrating,  mood swings,  and poor performance at work or school. There are three different ways to classify insomnia: Difficulty falling asleep. Difficulty staying asleep. Waking up too early in the morning. Any type of insomnia can be long-term (chronic) or short-term (acute). Both are common. Short-term insomnia usually lasts for 3 months or less. Chronic insomnia occurs at least three times a week for longer than 3 months. What are the causes? Insomnia may be caused by another condition, situation, or substance, such as: Having certain mental health conditions, such as anxiety and depression. Using caffeine, alcohol, tobacco, or drugs. Having gastrointestinal conditions, such as gastroesophageal reflux disease (GERD). Having certain medical conditions. These include: Asthma. Alzheimer's disease. Stroke. Chronic pain. An overactive thyroid gland (hyperthyroidism). Other sleep disorders, such as restless legs syndrome and sleep apnea. Menopause. Sometimes, the cause of insomnia may not be known. What increases the risk? Risk factors for insomnia include: Gender. Females are affected more often than males. Age. Insomnia is more common as people get older. Stress and certain medical and mental health conditions. Lack of exercise. Having an irregular work schedule. This may include working night shifts and traveling between different time zones. What are the signs or symptoms? If you have insomnia, the main symptom is having trouble falling asleep or having trouble staying asleep. This may lead to other symptoms, such as: Feeling tired or having low energy. Feeling nervous about going to sleep. Not feeling rested in the morning. Having trouble concentrating. Feeling irritable, anxious, or depressed. How is this diagnosed? This condition may be diagnosed based on: Your symptoms and medical history. Your health care provider may ask about: Your sleep habits. Any medical conditions you  have. Your mental health. A physical exam. How is this treated? Treatment for insomnia depends on the cause. Treatment may focus on treating an underlying condition that is causing the insomnia. Treatment may also include: Medicines to help you sleep. Counseling or therapy. Lifestyle adjustments to help you sleep better. Follow these instructions at home: Eating and drinking  Limit or avoid alcohol, caffeinated beverages, and products that contain nicotine and tobacco, especially close to bedtime. These can disrupt your sleep. Do not eat a large meal or eat spicy foods right before bedtime. This can lead to digestive discomfort that can make it hard for you to sleep. Sleep habits  Keep a sleep diary to help you and your health care provider figure out what could be causing your insomnia. Write down: When you sleep. When you wake up during the night. How well you sleep and how rested you feel the next day. Any side effects of medicines you are taking. What you eat and drink. Make your bedroom a dark, comfortable place where it is easy to fall asleep. Put up shades or blackout curtains to block light from outside. Use a white noise machine to block noise. Keep the temperature cool. Limit screen use before bedtime. This includes: Not watching TV. Not using your smartphone, tablet, or computer. Stick to a routine that includes going to bed and waking up at the same times every day and night. This can help you fall asleep faster. Consider making a quiet activity, such as reading, part of your nighttime routine. Try to avoid taking naps during the day so that you sleep better at night. Get out of bed if you are still awake after 15 minutes of trying to sleep. Keep the lights down, but try reading or doing a quiet activity. When you feel sleepy, go back to bed.  General instructions Take over-the-counter and prescription medicines only as told by your health care provider. Exercise regularly  as told by your health care provider. However, avoid exercising in the hours right before bedtime. Use relaxation techniques to manage stress. Ask your health care provider to suggest some techniques that may work well for you. These may include: Breathing exercises. Routines to release muscle tension. Visualizing peaceful scenes. Make sure that you drive carefully. Do not drive if you feel very sleepy. Keep all follow-up visits. This is important. Contact a health care provider if: You are tired throughout the day. You have trouble in your daily routine due to sleepiness. You continue to have sleep problems, or your sleep problems get worse. Get help right away if: You have thoughts about hurting yourself or someone else. Get help right away if you feel like you may hurt yourself or others, or have thoughts about taking your own life. Go to your nearest emergency room or: Call 911. Call the National Suicide Prevention Lifeline at (820)334-9206 or 988. This is open 24 hours a day. Text the Crisis Text Line at (930)728-8888. Summary Insomnia is a sleep disorder that makes it difficult to fall asleep or stay asleep. Insomnia can be long-term (chronic) or short-term (acute). Treatment for insomnia depends on the cause. Treatment may focus on treating an underlying condition that is causing the insomnia. Keep a sleep diary to help you and your health care provider figure out what could be causing your insomnia. This information is not intended to replace advice given to you by your health care provider. Make sure you discuss any questions you have with your health care provider. Document Revised: 08/25/2021 Document Reviewed: 08/25/2021 Elsevier Patient Education  2024 Elsevier Inc.   Managing the Challenge of Quitting Smoking Quitting smoking is a physical and mental challenge. You may have cravings, withdrawal symptoms, and temptation to smoke. Before quitting, work with your health care  provider to make a plan that can help you manage quitting. Making a plan before you quit may keep you from smoking when you have the urge to smoke while trying to quit. How to manage lifestyle changes Managing stress Stress can make you want to smoke, and wanting to smoke may cause stress. It is important to find ways to manage your stress. You could try some of the following: Practice relaxation techniques. Breathe slowly and deeply, in through your nose and out through your mouth. Listen to music. Soak in a bath or take a shower. Imagine a peaceful place or vacation. Get some support. Talk with family or friends about your stress. Join a support group. Talk with a counselor or therapist. Get some physical activity. Go for a walk, run, or bike ride. Play a favorite sport. Practice yoga.  Medicines Talk with your health care provider about medicines that might help you deal with cravings and make quitting easier for you. Relationships Social situations can be difficult when you are quitting smoking. To manage this, you can: Avoid parties and other social situations where people might be smoking. Avoid alcohol. Leave right away if you have the urge to smoke. Explain to your family and friends that you are quitting smoking. Ask for support and let them know you might be a bit grumpy. Plan activities where smoking is not an option. General instructions Be aware that many people gain weight after they quit smoking. However, not everyone does. To keep from gaining weight, have a plan in place before you quit, and  stick to the plan after you quit. Your plan should include: Eating healthy snacks. When you have a craving, it may help to: Eat popcorn, or try carrots, celery, or other cut vegetables. Chew sugar-free gum. Changing how you eat. Eat small portion sizes at meals. Eat 4-6 small meals throughout the day instead of 1-2 large meals a day. Be mindful when you eat. You should avoid  watching television or doing other things that might distract you as you eat. Exercising regularly. Make time to exercise each day. If you do not have time for a long workout, do short bouts of exercise for 5-10 minutes several times a day. Do some form of strengthening exercise, such as weight lifting. Do some exercise that gets your heart beating and causes you to breathe deeply, such as walking fast, running, swimming, or biking. This is very important. Drinking plenty of water or other low-calorie or no-calorie drinks. Drink enough fluid to keep your urine pale yellow.  How to recognize withdrawal symptoms Your body and mind may experience discomfort as you try to get used to not having nicotine in your system. These effects are called withdrawal symptoms. They may include: Feeling hungrier than normal. Having trouble concentrating. Feeling irritable or restless. Having trouble sleeping. Feeling depressed. Craving a cigarette. These symptoms may surprise you, but they are normal to have when quitting smoking. To manage withdrawal symptoms: Avoid places, people, and activities that trigger your cravings. Remember why you want to quit. Get plenty of sleep. Avoid coffee and other drinks that contain caffeine. These may worsen some of your symptoms. How to manage cravings Come up with a plan for how to deal with your cravings. The plan should include the following: A definition of the specific situation you want to deal with. An activity or action you will take to replace smoking. A clear idea for how this action will help. The name of someone who could help you with this. Cravings usually last for 5-10 minutes. Consider taking the following actions to help you with your plan to deal with cravings: Keep your mouth busy. Chew sugar-free gum. Suck on hard candies or a straw. Brush your teeth. Keep your hands and body busy. Change to a different activity right away. Squeeze or play  with a ball. Do an activity or a hobby, such as making bead jewelry, practicing needlepoint, or working with wood. Mix up your normal routine. Take a short exercise break. Go for a quick walk, or run up and down stairs. Focus on doing something kind or helpful for someone else. Call a friend or family member to talk during a craving. Join a support group. Contact a quitline. Where to find support To get help or find a support group: Call the National Cancer Institute's Smoking Quitline: 1-800-QUIT-NOW (724)474-4940) Text QUIT to SmokefreeTXT: 478295 Where to find more information Visit these websites to find more information on quitting smoking: U.S. Department of Health and Human Services: www.smokefree.gov American Lung Association: www.freedomfromsmoking.org Centers for Disease Control and Prevention (CDC): FootballExhibition.com.br American Heart Association: www.heart.org Contact a health care provider if: You want to change your plan for quitting. The medicines you are taking are not helping. Your eating feels out of control or you cannot sleep. You feel depressed or become very anxious. Summary Quitting smoking is a physical and mental challenge. You will face cravings, withdrawal symptoms, and temptation to smoke again. Preparation can help you as you go through these challenges. Try different techniques to manage stress, handle  social situations, and prevent weight gain. You can deal with cravings by keeping your mouth busy (such as by chewing gum), keeping your hands and body busy, calling family or friends, or contacting a quitline for people who want to quit smoking. You can deal with withdrawal symptoms by avoiding places where people smoke, getting plenty of rest, and avoiding drinks that contain caffeine. This information is not intended to replace advice given to you by your health care provider. Make sure you discuss any questions you have with your health care provider. Document Revised:  09/05/2021 Document Reviewed: 09/05/2021 Elsevier Patient Education  2024 Elsevier Inc.     Signed,   Meredith Staggers, MD [facility], [facility] 07/14/23 3:15 PM

## 2023-07-27 DIAGNOSIS — M25531 Pain in right wrist: Secondary | ICD-10-CM | POA: Insufficient documentation

## 2023-07-27 DIAGNOSIS — M654 Radial styloid tenosynovitis [de Quervain]: Secondary | ICD-10-CM | POA: Insufficient documentation

## 2023-08-11 ENCOUNTER — Ambulatory Visit: Payer: Medicare HMO | Admitting: Family Medicine

## 2023-08-19 ENCOUNTER — Ambulatory Visit (INDEPENDENT_AMBULATORY_CARE_PROVIDER_SITE_OTHER): Payer: Medicare HMO | Admitting: *Deleted

## 2023-08-19 DIAGNOSIS — Z0000 Encounter for general adult medical examination without abnormal findings: Secondary | ICD-10-CM | POA: Diagnosis not present

## 2023-08-19 NOTE — Progress Notes (Signed)
Subjective:   Elizabeth Lamb is a 67 y.o. female who presents for an Initial Medicare Annual Wellness Visit.  Visit Complete: Virtual I connected with  Elizabeth Lamb on 08/19/23 by a audio enabled telemedicine application and verified that I am speaking with the correct person using two identifiers.  Patient Location: Home  Provider Location: Home Office  I discussed the limitations of evaluation and management by telemedicine. The patient expressed understanding and agreed to proceed.  Vital Signs: Because this visit was a virtual/telehealth visit, some criteria may be missing or patient reported. Any vitals not documented were not able to be obtained and vitals that have been documented are patient reported.  Patient Medicare AWV questionnaire was completed by the patient on 07-14-2023; I have confirmed that all information answered by patient is correct and no changes since this date.  Cardiac Risk Factors include: advanced age (>92men, >43 women)     Objective:    There were no vitals filed for this visit. There is no height or weight on file to calculate BMI.     08/19/2023    4:14 PM 08/15/2014   11:02 AM  Advanced Directives  Does Patient Have a Medical Advance Directive? Yes No  Type of Scientist, research (life sciences) of Healthcare Power of Attorney in Chart? Yes - validated most recent copy scanned in chart (See row information)   Would patient like information on creating a medical advance directive?  No - patient declined information    Current Medications (verified) Outpatient Encounter Medications as of 08/19/2023  Medication Sig   calcium carbonate (TUMS - DOSED IN MG ELEMENTAL CALCIUM) 500 MG chewable tablet Chew 2 tablets by mouth daily.   eletriptan (RELPAX) 40 MG tablet TAKE 1 TABLET BY MOUTH AT ONSET OF HEADACHE. MAY REPEAT IN 2 HOURS IF HEADACHE PERSISTS OR RECURS   HYDROcodone-acetaminophen (NORCO/VICODIN) 5-325 MG tablet Take  by mouth.   hydrocortisone (CORTEF) 20 MG tablet Take 1 tablet (20 mg total) by mouth 2 (two) times daily. Take 1 20mg  table in the morning and 1/2 tab at bedtime   hydrOXYzine (ATARAX) 10 MG tablet Take 1 tablet (10 mg total) by mouth at bedtime as needed.   ketorolac (TORADOL) 10 MG tablet Take 10 mg by mouth every 6 (six) hours as needed for severe pain (pain score 7-10).   levothyroxine (SYNTHROID) 25 MCG tablet TAKE 1 TABLET BY MOUTH DAILY BEFORE BREAKFAST   Multiple Vitamins-Minerals (MULTIVITAMIN ADULT) CHEW Chew 1 each by mouth daily.   naproxen sodium (ANAPROX) 220 MG tablet Take 660 mg by mouth as needed (pain).   benzonatate (TESSALON) 100 MG capsule Take 1 capsule (100 mg total) by mouth 3 (three) times daily as needed for cough.   No facility-administered encounter medications on file as of 08/19/2023.    Allergies (verified) Vibramycin [doxycycline calcium]   History: Past Medical History:  Diagnosis Date   Allergy    Hypoadrenalism (HCC)    Pituitary abnormality (HCC)    Past Surgical History:  Procedure Laterality Date   ABDOMINAL HYSTERECTOMY     Partial   CHOLECYSTECTOMY     rotator repair     TONSILLECTOMY     TUBAL LIGATION     Family History  Problem Relation Age of Onset   Congestive Heart Failure Mother    Thyroid disease Mother    Hypertension Mother    Cancer Father        pancreatic  Hypertension Father    Diabetes Maternal Grandmother    Social History   Socioeconomic History   Marital status: Married    Spouse name: Not on file   Number of children: Not on file   Years of education: Not on file   Highest education level: Not on file  Occupational History   Not on file  Tobacco Use   Smoking status: Every Day   Smokeless tobacco: Never  Substance and Sexual Activity   Alcohol use: Yes   Drug use: No   Sexual activity: Yes    Birth control/protection: Surgical  Other Topics Concern   Not on file  Social History Narrative   Not  on file   Social Determinants of Health   Financial Resource Strain: Low Risk  (08/19/2023)   Overall Financial Resource Strain (CARDIA)    Difficulty of Paying Living Expenses: Not hard at all  Food Insecurity: No Food Insecurity (08/19/2023)   Hunger Vital Sign    Worried About Running Out of Food in the Last Year: Never true    Ran Out of Food in the Last Year: Never true  Transportation Needs: No Transportation Needs (08/19/2023)   PRAPARE - Administrator, Civil Service (Medical): No    Lack of Transportation (Non-Medical): No  Physical Activity: Unknown (08/19/2023)   Exercise Vital Sign    Days of Exercise per Week: Not on file    Minutes of Exercise per Session: 40 min  Stress: No Stress Concern Present (08/19/2023)   [employer] of Occupational Health - Occupational Stress Questionnaire    Feeling of Stress : Not at all  Social Connections: Socially Integrated (08/19/2023)   Social Connection and Isolation Panel [NHANES]    Frequency of Communication with Friends and Family: More than three times a week    Frequency of Social Gatherings with Friends and Family: More than three times a week    Attends Religious Services: More than 4 times per year    Active Member of [employer] or Organizations: Yes    Attends Engineer, structural: More than 4 times per year    Marital Status: Married    Tobacco Counseling Ready to quit: Not Answered Counseling given: Not Answered   Clinical Intake:  Pre-visit preparation completed: Yes  Pain : No/denies pain     Diabetes: No  How often do you need to have someone help you when you read instructions, pamphlets, or other written materials from your doctor or pharmacy?: 1 - Never  Interpreter Needed?: No  Information entered by :: Remi Haggard LPN   Activities of Daily Living    08/19/2023    4:21 PM  In your present state of health, do you have any difficulty performing the following activities:   Hearing? 0  Vision? 0  Difficulty concentrating or making decisions? 0  Walking or climbing stairs? 0  Dressing or bathing? 0  Doing errands, shopping? 0  Preparing Food and eating ? N  Using the Toilet? N  In the past six months, have you accidently leaked urine? N  Do you have problems with loss of bowel control? N  Managing your Medications? N  Managing your Finances? N  Housekeeping or managing your Housekeeping? N    Patient Care Team: Shade Flood, MD as PCP - General (Family Medicine) Patient, No Pcp Per (General Practice)  Indicate any recent Medical Services you may have received from other than [facility] providers in the past year (date  may be approximate).     Assessment:   This is a routine wellness examination for Patrena.  Hearing/Vision screen Hearing Screening - Comments:: Bilateral hearing aids  Vision Screening - Comments:: Up to date [facility]   Goals Addressed             This Visit's Progress    Patient Stated       No g       Depression Screen    08/19/2023    4:17 PM 07/14/2023    2:09 PM  PHQ 2/9 Scores  PHQ - 2 Score 0 0  PHQ- 9 Score 0     Fall Risk    08/19/2023    4:10 PM 07/14/2023    2:09 PM  Fall Risk   Falls in the past year? 0 0  Number falls in past yr: 0 0  Injury with Fall? 0 0  Risk for fall due to :  No Fall Risks  Follow up Falls evaluation completed;Education provided;Falls prevention discussed Falls evaluation completed    MEDICARE RISK AT HOME: Medicare Risk at Home Any stairs in or around the home?: Yes If so, are there any without handrails?: No Home free of loose throw rugs in walkways, pet beds, electrical cords, etc?: Yes Adequate lighting in your home to reduce risk of falls?: Yes Life alert?: No Use of a cane, walker or w/c?: No Grab bars in the bathroom?: No Shower chair or bench in shower?: No Elevated toilet seat or a handicapped toilet?: No  TIMED UP AND GO:  Was the test performed? No     Cognitive Function:        08/19/2023    4:15 PM  6CIT Screen  What Year? 0 points  What month? 0 points  What time? 0 points  Count back from 20 0 points  Months in reverse 0 points  Repeat phrase 0 points  Total Score 0 points    Immunizations Immunization History  Administered Date(s) Administered   Influenza, High Dose Seasonal PF 07/17/2020   Influenza,inj,Quad PF,6+ Mos 11/09/2014, 06/20/2019   Influenza-Unspecified 07/10/2015, 07/22/2023   PPD Test 01/27/2013   Pneumococcal Conjugate-13 06/20/2019   Tdap 07/22/2023   Zoster, Live 11/09/2014    TDAP status: Up to date  Flu Vaccine status: Up to date  Pneumonia  patient reported yes  unable to confirm  Covid-19 vaccine status: Information provided on how to obtain vaccines.   Qualifies for Shingles Vaccine? Patient reported yes unable to confirm    Screening Tests Health Maintenance  Topic Date Due   COVID-19 Vaccine (1 - 2023-24 season) 09/04/2023 (Originally 05/30/2023)   Zoster Vaccines- Shingrix (1 of 2) 11/19/2023 (Originally 11/12/2004)   Pneumonia Vaccine 29+ Years old (2 of 2 - PPSV23 or PCV20) 08/18/2024 (Originally 08/15/2019)   MAMMOGRAM  08/18/2024 (Originally 11/12/2004)   DEXA SCAN  08/18/2024 (Originally 11/13/2019)   Colonoscopy  08/18/2024 (Originally 11/13/1999)   Hepatitis C Screening  08/18/2024 (Originally 11/12/1972)   Medicare Annual Wellness (AWV)  08/18/2024   DTaP/Tdap/Td (2 - Td or Tdap) 07/21/2033   INFLUENZA VACCINE  Completed   HPV VACCINES  Aged Out    Health Maintenance  There are no preventive care reminders to display for this patient.   Colonoscopy   never done  Mammogram  scheduled  Bone Density never done  Lung Cancer Screening: (Low Dose CT Chest recommended if Age 47-80 years, 20 pack-year currently smoking OR have quit w/in 15years.) does not qualify.  Lung Cancer Screening Referral:   Additional Screening:  Hepatitis C Screening:  Never done  Vision  Screening: Recommended annual ophthalmology exams for early detection of glaucoma and other disorders of the eye. Is the patient up to date with their annual eye exam?  Yes  Who is the provider or what is the name of the office in which the patient attends annual eye exams? [facility] If pt is not established with a provider, would they like to be referred to a provider to establish care? No .   Dental Screening: Recommended annual dental exams for proper oral hygiene    Community Resource Referral / Chronic Care Management: CRR required this visit?  No   CCM required this visit?  No     Plan:     I have personally reviewed and noted the following in the patient's chart:   Medical and social history Use of alcohol, tobacco or illicit drugs  Current medications and supplements including opioid prescriptions. Patient is currently taking opioid prescriptions. Information provided to patient regarding non-opioid alternatives. Patient advised to discuss non-opioid treatment plan with their provider. Functional ability and status Nutritional status Physical activity Advanced directives List of other physicians Hospitalizations, surgeries, and ER visits in previous 12 months Vitals Screenings to include cognitive, depression, and falls Referrals and appointments  In addition, I have reviewed and discussed with patient certain preventive protocols, quality metrics, and best practice recommendations. A written personalized care plan for preventive services as well as general preventive health recommendations were provided to patient.     Remi Haggard, LPN   [id number]   After Visit Summary: (MyChart) Due to this being a telephonic visit, the after visit summary with patients personalized plan was offered to patient via MyChart   Nurse Notes: Patient was not happy about doing exam.  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  Unable to confirm all her Vaccines patient reported they were  done.   Has an appointment 09-01-2023

## 2023-08-19 NOTE — Patient Instructions (Signed)
Elizabeth Lamb , Thank you for taking time to come for your Medicare Wellness Visit. I appreciate your ongoing commitment to your health goals. Please review the following plan we discussed and let me know if I can assist you in the future.   Screening recommendations/referrals: Colonoscopy:  not done Mammogram: scheduled Bone Density: never done Recommended yearly ophthalmology/optometry visit for glaucoma screening and checkup Recommended yearly dental visit for hygiene and checkup  Vaccinations: Influenza vaccine: up to date Pneumococcal vaccine:  Tdap vaccine: up to date    Preventive Care 65 Years and Older, Female Preventive care refers to lifestyle choices and visits with your health care provider that can promote health and wellness. What does preventive care include? A yearly physical exam. This is also called an annual well check. Dental exams once or twice a year. Routine eye exams. Ask your health care provider how often you should have your eyes checked. Personal lifestyle choices, including: Daily care of your teeth and gums. Regular physical activity. Eating a healthy diet. Avoiding tobacco and drug use. Limiting alcohol use. Practicing safe sex. Taking low-dose aspirin every day. Taking vitamin and mineral supplements as recommended by your health care provider. What happens during an annual well check? The services and screenings done by your health care provider during your annual well check will depend on your age, overall health, lifestyle risk factors, and family history of disease. Counseling  Your health care provider may ask you questions about your: Alcohol use. Tobacco use. Drug use. Emotional well-being. Home and relationship well-being. Sexual activity. Eating habits. History of falls. Memory and ability to understand (cognition). Work and work Astronomer. Reproductive health. Screening  You may have the following tests or  measurements: Height, weight, and BMI. Blood pressure. Lipid and cholesterol levels. These may be checked every 5 years, or more frequently if you are over 69 years old. Skin check. Lung cancer screening. You may have this screening every year starting at age 34 if you have a 30-pack-year history of smoking and currently smoke or have quit within the past 15 years. Fecal occult blood test (FOBT) of the stool. You may have this test every year starting at age 75. Flexible sigmoidoscopy or colonoscopy. You may have a sigmoidoscopy every 5 years or a colonoscopy every 10 years starting at age 24. Hepatitis C blood test. Hepatitis B blood test. Sexually transmitted disease (STD) testing. Diabetes screening. This is done by checking your blood sugar (glucose) after you have not eaten for a while (fasting). You may have this done every 1-3 years. Bone density scan. This is done to screen for osteoporosis. You may have this done starting at age 45. Mammogram. This may be done every 1-2 years. Talk to your health care provider about how often you should have regular mammograms. Talk with your health care provider about your test results, treatment options, and if necessary, the need for more tests. Vaccines  Your health care provider may recommend certain vaccines, such as: Influenza vaccine. This is recommended every year. Tetanus, diphtheria, and acellular pertussis (Tdap, Td) vaccine. You may need a Td booster every 10 years. Zoster vaccine. You may need this after age 75. Pneumococcal 13-valent conjugate (PCV13) vaccine. One dose is recommended after age 16. Pneumococcal polysaccharide (PPSV23) vaccine. One dose is recommended after age 55. Talk to your health care provider about which screenings and vaccines you need and how often you need them. This information is not intended to replace advice given to you by your  health care provider. Make sure you discuss any questions you have with your  health care provider. Document Released: 10/11/2015 Document Revised: 06/03/2016 Document Reviewed: 07/16/2015 Elsevier Interactive Patient Education  2017 [employer].  Fall Prevention in the Home Falls can cause injuries. They can happen to people of all ages. There are many things you can do to make your home safe and to help prevent falls. What can I do on the outside of my home? Regularly fix the edges of walkways and driveways and fix any cracks. Remove anything that might make you trip as you walk through a door, such as a raised step or threshold. Trim any bushes or trees on the path to your home. Use bright outdoor lighting. Clear any walking paths of anything that might make someone trip, such as rocks or tools. Regularly check to see if handrails are loose or broken. Make sure that both sides of any steps have handrails. Any raised decks and porches should have guardrails on the edges. Have any leaves, snow, or ice cleared regularly. Use sand or salt on walking paths during winter. Clean up any spills in your garage right away. This includes oil or grease spills. What can I do in the bathroom? Use night lights. Install grab bars by the toilet and in the tub and shower. Do not use towel bars as grab bars. Use non-skid mats or decals in the tub or shower. If you need to sit down in the shower, use a plastic, non-slip stool. Keep the floor dry. Clean up any water that spills on the floor as soon as it happens. Remove soap buildup in the tub or shower regularly. Attach bath mats securely with double-sided non-slip rug tape. Do not have throw rugs and other things on the floor that can make you trip. What can I do in the bedroom? Use night lights. Make sure that you have a light by your bed that is easy to reach. Do not use any sheets or blankets that are too big for your bed. They should not hang down onto the floor. Have a firm chair that has side arms. You can use this for  support while you get dressed. Do not have throw rugs and other things on the floor that can make you trip. What can I do in the kitchen? Clean up any spills right away. Avoid walking on wet floors. Keep items that you use a lot in easy-to-reach places. If you need to reach something above you, use a strong step stool that has a grab bar. Keep electrical cords out of the way. Do not use floor polish or wax that makes floors slippery. If you must use wax, use non-skid floor wax. Do not have throw rugs and other things on the floor that can make you trip. What can I do with my stairs? Do not leave any items on the stairs. Make sure that there are handrails on both sides of the stairs and use them. Fix handrails that are broken or loose. Make sure that handrails are as long as the stairways. Check any carpeting to make sure that it is firmly attached to the stairs. Fix any carpet that is loose or worn. Avoid having throw rugs at the top or bottom of the stairs. If you do have throw rugs, attach them to the floor with carpet tape. Make sure that you have a light switch at the top of the stairs and the bottom of the stairs. If you do  not have them, ask someone to add them for you. What else can I do to help prevent falls? Wear shoes that: Do not have high heels. Have rubber bottoms. Are comfortable and fit you well. Are closed at the toe. Do not wear sandals. If you use a stepladder: Make sure that it is fully opened. Do not climb a closed stepladder. Make sure that both sides of the stepladder are locked into place. Ask someone to hold it for you, if possible. Clearly mark and make sure that you can see: Any grab bars or handrails. First and last steps. Where the edge of each step is. Use tools that help you move around (mobility aids) if they are needed. These include: Canes. Walkers. Scooters. Crutches. Turn on the lights when you go into a dark area. Replace any light bulbs as soon  as they burn out. Set up your furniture so you have a clear path. Avoid moving your furniture around. If any of your floors are uneven, fix them. If there are any pets around you, be aware of where they are. Review your medicines with your doctor. Some medicines can make you feel dizzy. This can increase your chance of falling. Ask your doctor what other things that you can do to help prevent falls. This information is not intended to replace advice given to you by your health care provider. Make sure you discuss any questions you have with your health care provider. Document Released: 07/11/2009 Document Revised: 02/20/2016 Document Reviewed: 10/19/2014 Elsevier Interactive Patient Education  2017 [employer].

## 2023-09-01 ENCOUNTER — Ambulatory Visit: Payer: Medicare HMO | Admitting: Family Medicine

## 2023-09-01 VITALS — BP 124/62 | HR 84 | Temp 98.0°F | Ht 64.0 in | Wt 153.6 lb

## 2023-09-01 DIAGNOSIS — G4700 Insomnia, unspecified: Secondary | ICD-10-CM

## 2023-09-01 DIAGNOSIS — Z87891 Personal history of nicotine dependence: Secondary | ICD-10-CM

## 2023-09-01 NOTE — Progress Notes (Unsigned)
Subjective:  Patient ID: Elizabeth Lamb, female    DOB: April 05, 1956  Age: 67 y.o. MRN: [id number]  CC:  Chief Complaint  Patient presents with   Insomnia    HPI Elizabeth Lamb presents for   Insomnia Discussed at her visit October 16.  Trouble getting to sleep at that time, but able to maintain sleep when she was asleep.  No history of OSA.  Had not been taking any treatments at that time.  Rare restless leg symptoms 1-2 times per month.  Handout given on sleep hygiene, option of melatonin or short-term hydroxyzine if needed.  Advised to stop these meds if any increased RLS symptoms. Sleep has been better. Did not take melatonin or hydroxyzine. No other changes unknown reason for improvement. Nocturia once per night. No RLS sx's recently.   Cough resolved. Tessalon perles helped.  80 days out  from quitting smoking. Some weight gain with quitting. Has been stable around 148-149 past month.   Wt Readings from Last 3 Encounters:  09/01/23 153 lb 9.6 oz (69.7 kg)  07/14/23 151 lb 8 oz (68.7 kg)  09/11/22 141 lb 12.8 oz (64.3 kg)   Appt with new endocrinologist 09/13/23.   Saw [facility] in October - injection for R handed Dequervain's. Doing better. Has brace - has been doing well.  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 Amanda Pea on 12/19.   Possible cataracts - will be seeing Dr. Fabian Sharp on 12/31.   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 Authorizing Provider  benzonatate (TESSALON) 100 MG capsule Take 1 capsule (100 mg total) by mouth 3 (three) times daily as needed for cough. 07/14/23   Shade Flood, MD  calcium carbonate (TUMS - DOSED IN MG ELEMENTAL CALCIUM) 500 MG chewable tablet Chew 2 tablets by mouth daily.    [provider]  eletriptan (RELPAX) 40 MG tablet TAKE 1 TABLET BY MOUTH AT ONSET OF HEADACHE. MAY REPEAT IN 2 HOURS IF HEADACHE PERSISTS OR RECURS 01/04/23   Reather Littler, MD  HYDROcodone-acetaminophen (NORCO/VICODIN) 5-325 MG tablet Take by mouth. 05/27/21   [provider]  hydrocortisone (CORTEF) 20 MG tablet Take 1 tablet (20 mg total) by mouth 2 (two) times daily. Take 1 20mg  table in the morning and 1/2 tab at bedtime 06/14/23   Thapa, [place], MD  hydrOXYzine (ATARAX) 10 MG tablet Take 1 tablet (10 mg total) by mouth at bedtime as needed. 07/14/23   Shade Flood, MD  ketorolac (TORADOL) 10 MG tablet Take 10 mg by mouth every 6 (six) hours as needed for severe pain (pain score 7-10).    [provider]  levothyroxine (SYNTHROID) 25 MCG tablet TAKE 1 TABLET BY MOUTH DAILY BEFORE BREAKFAST 03/06/23   Reather Littler, MD  Multiple Vitamins-Minerals (MULTIVITAMIN ADULT) CHEW Chew 1 each by mouth daily.    [provider]  naproxen sodium (ANAPROX) 220 MG tablet Take 660 mg by mouth as needed (pain).    [provider]   Social History   Socioeconomic History   Marital status: Married    Spouse name: Not on file   Number of children: Not on file   Years of education: Not on file   Highest education level: Associate degree: occupational, Scientist, product/process development, or vocational program  Occupational History   Not on file  Tobacco Use   Smoking status: Every Day   Smokeless tobacco: Never  Substance and Sexual Activity   Alcohol use: Yes   Drug use: No   Sexual activity: Yes    Birth control/protection: Surgical  Other Topics Concern   Not on file  Social History  Narrative   Not on file   Social Determinants of Health   Financial Resource Strain: Low Risk  (08/31/2023)   Overall Financial Resource Strain (CARDIA)    Difficulty of Paying Living Expenses: Not hard at all  Food Insecurity: No Food Insecurity (08/31/2023)   Hunger Vital Sign    Worried About Running Out of Food in the Last Year: Never true    Ran Out of Food in the Last Year: Never true  Transportation Needs: No Transportation Needs (08/31/2023)   PRAPARE - Administrator, Civil Service (Medical): No    Lack of Transportation (Non-Medical): No  Physical Activity: Insufficiently Active (08/31/2023)   Exercise Vital Sign    Days of Exercise per Week: 3 days    Minutes of Exercise per Session: 20 min  Stress: No Stress Concern Present (08/31/2023)   [employer] of Occupational Health - Occupational Stress Questionnaire    Feeling of Stress : Not at all  Social Connections: Socially Integrated (08/31/2023)   Social Connection and Isolation Panel [NHANES]    Frequency of Communication with Friends and Family: More than three times a week    Frequency of Social Gatherings with Friends and Family: Once a week    Attends Religious Services: More than 4 times per year    Active Member of [employer] or Organizations: Yes    Attends Banker Meetings: More than 4 times per year    Marital Status: Married  Catering manager Violence: Not At Risk (08/19/2023)   Humiliation, Afraid, Rape, and Kick questionnaire    Fear of Current or Ex-Partner: No    Emotionally Abused: No    Physically Abused: No    Sexually Abused: No    Review of Systems Per HPI.   Objective:   Vitals:   09/01/23 1556  BP: 124/62  Pulse: 84  Temp: 98 F (36.7 C)  TempSrc: Temporal  SpO2: 96%  Weight: 153 lb 9.6 oz (69.7 kg)  Height: 5\' 4"  (1.626 m)     Physical Exam Vitals reviewed.  Constitutional:      Appearance: Normal appearance. She is well-developed.  HENT:     Head:  Normocephalic and atraumatic.  Eyes:     Conjunctiva/sclera: Conjunctivae normal.     Pupils: Pupils are equal, round, and reactive to light.  Neck:     Vascular: No carotid bruit.  Cardiovascular:     Rate and Rhythm: Normal rate and regular rhythm.     Heart sounds: Normal heart sounds.  Pulmonary:     Effort: Pulmonary effort is normal.     Breath sounds: Normal breath sounds.  Abdominal:     Palpations: Abdomen is soft. There is no pulsatile  mass.     Tenderness: There is no abdominal tenderness.  Musculoskeletal:     Right lower leg: No edema.     Left lower leg: No edema.  Skin:    General: Skin is warm and dry.  Neurological:     Mental Status: She is alert and oriented to person, place, and time.  Psychiatric:        Mood and Affect: Mood normal.        Behavior: Behavior normal.        Assessment & Plan:  Elizabeth Lamb is a 67 y.o. female . Insomnia, unspecified type  History of tobacco use   No orders of the defined types were placed in this encounter.  Patient Instructions   See info below if needed for cravings, but I am very happy to hear about your quitting smoking! Keep up the good work! Let me know if I can help further.   Glad to hear your sleep is better. Melatonin or hydroxyzine if needed.    Keep follow up with ortho, endocrine, and optho as planned. Let me know if there are questions, but follow up in 6 months.   Take care!  Managing the Challenge of Quitting Smoking Quitting smoking is a physical and mental challenge. You may have cravings, withdrawal symptoms, and temptation to smoke. Before quitting, work with your health care provider to make a plan that can help you manage quitting. Making a plan before you quit may keep you from smoking when you have the urge to smoke while trying to quit. How to manage lifestyle changes Managing stress Stress can make you want to smoke, and wanting to smoke may cause stress. It is important to find  ways to manage your stress. You could try some of the following: Practice relaxation techniques. Breathe slowly and deeply, in through your nose and out through your mouth. Listen to music. Soak in a bath or take a shower. Imagine a peaceful place or vacation. Get some support. Talk with family or friends about your stress. Join a support group. Talk with a counselor or therapist. Get some physical activity. Go for a walk, run, or bike ride. Play a favorite sport. Practice yoga.  Medicines Talk with your health care provider about medicines that might help you deal with cravings and make quitting easier for you. Relationships Social situations can be difficult when you are quitting smoking. To manage this, you can: Avoid parties and other social situations where people might be smoking. Avoid alcohol. Leave right away if you have the urge to smoke. Explain to your family and friends that you are quitting smoking. Ask for support and let them know you might be a bit grumpy. Plan activities where smoking is not an option. General instructions Be aware that many people gain weight after they quit smoking. However, not everyone does. To keep from gaining weight, have a plan in place before you quit, and stick to the plan after you quit. Your plan should include: Eating healthy snacks. When you have a craving, it may help to: Eat popcorn, or try carrots, celery, or other cut vegetables. Chew sugar-free gum. Changing how you eat. Eat small portion sizes at meals. Eat 4-6 small meals throughout the day instead of 1-2 large meals a day. Be mindful when you eat. You should avoid watching television or doing other things that might distract you as you eat. Exercising regularly. Make time to exercise each day. If you do not have  time for a long workout, do short bouts of exercise for 5-10 minutes several times a day. Do some form of strengthening exercise, such as weight lifting. Do some  exercise that gets your heart beating and causes you to breathe deeply, such as walking fast, running, swimming, or biking. This is very important. Drinking plenty of water or other low-calorie or no-calorie drinks. Drink enough fluid to keep your urine pale yellow.  How to recognize withdrawal symptoms Your body and mind may experience discomfort as you try to get used to not having nicotine in your system. These effects are called withdrawal symptoms. They may include: Feeling hungrier than normal. Having trouble concentrating. Feeling irritable or restless. Having trouble sleeping. Feeling depressed. Craving a cigarette. These symptoms may surprise you, but they are normal to have when quitting smoking. To manage withdrawal symptoms: Avoid places, people, and activities that trigger your cravings. Remember why you want to quit. Get plenty of sleep. Avoid coffee and other drinks that contain caffeine. These may worsen some of your symptoms. How to manage cravings Come up with a plan for how to deal with your cravings. The plan should include the following: A definition of the specific situation you want to deal with. An activity or action you will take to replace smoking. A clear idea for how this action will help. The name of someone who could help you with this. Cravings usually last for 5-10 minutes. Consider taking the following actions to help you with your plan to deal with cravings: Keep your mouth busy. Chew sugar-free gum. Suck on hard candies or a straw. Brush your teeth. Keep your hands and body busy. Change to a different activity right away. Squeeze or play with a ball. Do an activity or a hobby, such as making bead jewelry, practicing needlepoint, or working with wood. Mix up your normal routine. Take a short exercise break. Go for a quick walk, or run up and down stairs. Focus on doing something kind or helpful for someone else. Call a friend or family member to  talk during a craving. Join a support group. Contact a quitline. Where to find support To get help or find a support group: Call the National Cancer Institute's Smoking Quitline: 1-800-QUIT-NOW 218-231-8661) Text QUIT to SmokefreeTXT: 454098 Where to find more information Visit these websites to find more information on quitting smoking: U.S. Department of Health and Human Services: www.smokefree.gov American Lung Association: www.freedomfromsmoking.org Centers for Disease Control and Prevention (CDC): FootballExhibition.com.br American Heart Association: www.heart.org Contact a health care provider if: You want to change your plan for quitting. The medicines you are taking are not helping. Your eating feels out of control or you cannot sleep. You feel depressed or become very anxious. Summary Quitting smoking is a physical and mental challenge. You will face cravings, withdrawal symptoms, and temptation to smoke again. Preparation can help you as you go through these challenges. Try different techniques to manage stress, handle social situations, and prevent weight gain. You can deal with cravings by keeping your mouth busy (such as by chewing gum), keeping your hands and body busy, calling family or friends, or contacting a quitline for people who want to quit smoking. You can deal with withdrawal symptoms by avoiding places where people smoke, getting plenty of rest, and avoiding drinks that contain caffeine. This information is not intended to replace advice given to you by your health care provider. Make sure you discuss any questions you have with your health care provider. Document  Revised: 09/05/2021 Document Reviewed: 09/05/2021 Elsevier Patient Education  2024 Elsevier Inc.       Signed,   Meredith Staggers, MD [facility], [facility] 09/01/23 4:54 PM

## 2023-09-01 NOTE — Patient Instructions (Addendum)
See info below if needed for cravings, but I am very happy to hear about your quitting smoking! Keep up the good work! Let me know if I can help further.   Glad to hear your sleep is better. Melatonin or hydroxyzine if needed.    Keep follow up with ortho, endocrine, and optho as planned. Let me know if there are questions, but follow up in 6 months.   Take care!  Managing the Challenge of Quitting Smoking Quitting smoking is a physical and mental challenge. You may have cravings, withdrawal symptoms, and temptation to smoke. Before quitting, work with your health care provider to make a plan that can help you manage quitting. Making a plan before you quit may keep you from smoking when you have the urge to smoke while trying to quit. How to manage lifestyle changes Managing stress Stress can make you want to smoke, and wanting to smoke may cause stress. It is important to find ways to manage your stress. You could try some of the following: Practice relaxation techniques. Breathe slowly and deeply, in through your nose and out through your mouth. Listen to music. Soak in a bath or take a shower. Imagine a peaceful place or vacation. Get some support. Talk with family or friends about your stress. Join a support group. Talk with a counselor or therapist. Get some physical activity. Go for a walk, run, or bike ride. Play a favorite sport. Practice yoga.  Medicines Talk with your health care provider about medicines that might help you deal with cravings and make quitting easier for you. Relationships Social situations can be difficult when you are quitting smoking. To manage this, you can: Avoid parties and other social situations where people might be smoking. Avoid alcohol. Leave right away if you have the urge to smoke. Explain to your family and friends that you are quitting smoking. Ask for support and let them know you might be a bit grumpy. Plan activities where smoking is  not an option. General instructions Be aware that many people gain weight after they quit smoking. However, not everyone does. To keep from gaining weight, have a plan in place before you quit, and stick to the plan after you quit. Your plan should include: Eating healthy snacks. When you have a craving, it may help to: Eat popcorn, or try carrots, celery, or other cut vegetables. Chew sugar-free gum. Changing how you eat. Eat small portion sizes at meals. Eat 4-6 small meals throughout the day instead of 1-2 large meals a day. Be mindful when you eat. You should avoid watching television or doing other things that might distract you as you eat. Exercising regularly. Make time to exercise each day. If you do not have time for a long workout, do short bouts of exercise for 5-10 minutes several times a day. Do some form of strengthening exercise, such as weight lifting. Do some exercise that gets your heart beating and causes you to breathe deeply, such as walking fast, running, swimming, or biking. This is very important. Drinking plenty of water or other low-calorie or no-calorie drinks. Drink enough fluid to keep your urine pale yellow.  How to recognize withdrawal symptoms Your body and mind may experience discomfort as you try to get used to not having nicotine in your system. These effects are called withdrawal symptoms. They may include: Feeling hungrier than normal. Having trouble concentrating. Feeling irritable or restless. Having trouble sleeping. Feeling depressed. Craving a cigarette. These symptoms may surprise  you, but they are normal to have when quitting smoking. To manage withdrawal symptoms: Avoid places, people, and activities that trigger your cravings. Remember why you want to quit. Get plenty of sleep. Avoid coffee and other drinks that contain caffeine. These may worsen some of your symptoms. How to manage cravings Come up with a plan for how to deal with your  cravings. The plan should include the following: A definition of the specific situation you want to deal with. An activity or action you will take to replace smoking. A clear idea for how this action will help. The name of someone who could help you with this. Cravings usually last for 5-10 minutes. Consider taking the following actions to help you with your plan to deal with cravings: Keep your mouth busy. Chew sugar-free gum. Suck on hard candies or a straw. Brush your teeth. Keep your hands and body busy. Change to a different activity right away. Squeeze or play with a ball. Do an activity or a hobby, such as making bead jewelry, practicing needlepoint, or working with wood. Mix up your normal routine. Take a short exercise break. Go for a quick walk, or run up and down stairs. Focus on doing something kind or helpful for someone else. Call a friend or family member to talk during a craving. Join a support group. Contact a quitline. Where to find support To get help or find a support group: Call the National Cancer Institute's Smoking Quitline: 1-800-QUIT-NOW (515)726-5514) Text QUIT to SmokefreeTXT: 454098 Where to find more information Visit these websites to find more information on quitting smoking: U.S. Department of Health and Human Services: www.smokefree.gov American Lung Association: www.freedomfromsmoking.org Centers for Disease Control and Prevention (CDC): FootballExhibition.com.br American Heart Association: www.heart.org Contact a health care provider if: You want to change your plan for quitting. The medicines you are taking are not helping. Your eating feels out of control or you cannot sleep. You feel depressed or become very anxious. Summary Quitting smoking is a physical and mental challenge. You will face cravings, withdrawal symptoms, and temptation to smoke again. Preparation can help you as you go through these challenges. Try different techniques to manage stress,  handle social situations, and prevent weight gain. You can deal with cravings by keeping your mouth busy (such as by chewing gum), keeping your hands and body busy, calling family or friends, or contacting a quitline for people who want to quit smoking. You can deal with withdrawal symptoms by avoiding places where people smoke, getting plenty of rest, and avoiding drinks that contain caffeine. This information is not intended to replace advice given to you by your health care provider. Make sure you discuss any questions you have with your health care provider. Document Revised: 09/05/2021 Document Reviewed: 09/05/2021 Elsevier Patient Education  2024 [employer].

## 2023-09-02 ENCOUNTER — Encounter: Payer: Self-pay | Admitting: Family Medicine

## 2023-09-06 ENCOUNTER — Other Ambulatory Visit: Payer: Self-pay

## 2023-09-06 DIAGNOSIS — E2749 Other adrenocortical insufficiency: Secondary | ICD-10-CM

## 2023-09-06 DIAGNOSIS — E038 Other specified hypothyroidism: Secondary | ICD-10-CM

## 2023-09-09 ENCOUNTER — Other Ambulatory Visit: Payer: Medicare HMO

## 2023-09-09 DIAGNOSIS — E038 Other specified hypothyroidism: Secondary | ICD-10-CM | POA: Diagnosis not present

## 2023-09-10 ENCOUNTER — Other Ambulatory Visit: Payer: Medicare HMO

## 2023-09-10 LAB — BASIC METABOLIC PANEL
BUN: 11 mg/dL (ref 7–25)
CO2: 32 mmol/L (ref 20–32)
Calcium: 9.6 mg/dL (ref 8.6–10.4)
Chloride: 103 mmol/L (ref 98–110)
Creat: 0.7 mg/dL (ref 0.50–1.05)
Glucose, Bld: 89 mg/dL (ref 65–99)
Potassium: 4.5 mmol/L (ref 3.5–5.3)
Sodium: 141 mmol/L (ref 135–146)

## 2023-09-10 LAB — T4, FREE: Free T4: 1.1 ng/dL (ref 0.8–1.8)

## 2023-09-10 LAB — TSH: TSH: 1.9 m[IU]/L (ref 0.40–4.50)

## 2023-09-13 ENCOUNTER — Ambulatory Visit: Payer: Medicare HMO | Admitting: Endocrinology

## 2023-09-13 ENCOUNTER — Encounter: Payer: Self-pay | Admitting: Endocrinology

## 2023-09-13 VITALS — BP 136/80 | HR 74 | Resp 20 | Ht 64.0 in | Wt 152.4 lb

## 2023-09-13 DIAGNOSIS — E2749 Other adrenocortical insufficiency: Secondary | ICD-10-CM

## 2023-09-13 DIAGNOSIS — E038 Other specified hypothyroidism: Secondary | ICD-10-CM | POA: Diagnosis not present

## 2023-09-13 MED ORDER — HYDROCORTISONE 20 MG PO TABS: ORAL_TABLET | ORAL | 3 refills | Status: DC

## 2023-09-13 MED ORDER — LEVOTHYROXINE SODIUM 25 MCG PO TABS: 25.0000 ug | ORAL_TABLET | Freq: Every day | ORAL | 3 refills | Status: DC

## 2023-09-13 NOTE — Progress Notes (Signed)
Outpatient Endocrinology Note [place] Ashlon Lottman, MD  09/14/23  Patient's Name: Elizabeth Lamb    DOB: [birth year]    MRN: [id number]  REASON OF VISIT: Follow-up for hypothyroidism /secondary adrenal insufficiency  PCP: Shade Flood, MD  HISTORY OF PRESENT ILLNESS:   Elizabeth Lamb is a 67 y.o. old female with past medical history as listed below is presented for a follow up for secondary adrenal insufficiency and hypothyroidism.   Pertinent History: 1. Patient has had secondary adrenal insufficiency since 07/2008 when she was admitted to the hospital with a sodium of 121. She also has a partial empty sella but no other pituitary hormone deficiencies   She does take her hydrocortisone daily consistently, usually takes 20 mg at about 5 a.m. and 10 mg between 2 and 3 PM She states if she takes her afternoon dose after 4 PM she may have difficulty falling asleep.    2. Primary hypothyroidism: In 2014 she had a TSH level of 5.9 checked incidentally.  Free T4 has been consistently normal previously. She was not complaining of unusual fatigue, cold intolerance, hair loss or dry skin. She does have family history of hypothyroidism The patient wanted to empirically try thyroid supplement in 5/14. She has been taking 25 mcg daily of levothyroxine since then with initial improvement in her energy level    Interval history  Patient has been taking levothyroxine.  Hydrocortisone as noted above.  She reports compliance.  Overall feeling good.  No lightheadedness, nausea, change in bowel habit, or heat intolerance.  No palpitation.  She had recent lab with normal electrolytes, potassium, renal function and thyroid function test.    Latest Reference Range & Units 09/09/23 13:54  Sodium 135 - 146 mmol/L 141  Potassium 3.5 - 5.3 mmol/L 4.5  Chloride 98 - 110 mmol/L 103  CO2 20 - 32 mmol/L 32  Glucose 65 - 99 mg/dL 89  BUN 7 - 25 mg/dL 11  Creatinine 1.91 - 4.78 mg/dL 2.95  Calcium 8.6 - 62.1  mg/dL 9.6  BUN/Creatinine Ratio 6 - 22 (calc) SEE NOTE:    Latest Reference Range & Units 09/09/23 13:54  Glucose 65 - 99 mg/dL 89  TSH 3.08 - 6.57 mIU/L 1.90  T4,Free(Direct) 0.8 - 1.8 ng/dL 1.1    REVIEW OF SYSTEMS:  As per history of present illness.   PAST MEDICAL HISTORY: Past Medical History:  Diagnosis Date   Allergy    Hypoadrenalism (HCC)    Pituitary abnormality (HCC)     PAST SURGICAL HISTORY: Past Surgical History:  Procedure Laterality Date   ABDOMINAL HYSTERECTOMY     Partial   CHOLECYSTECTOMY     rotator repair     TONSILLECTOMY     TUBAL LIGATION      ALLERGIES: Allergies  Allergen Reactions   Vibramycin [Doxycycline Calcium] Rash    FAMILY HISTORY:  Family History  Problem Relation Age of Onset   Congestive Heart Failure Mother    Thyroid disease Mother    Hypertension Mother    Cancer Father        pancreatic   Hypertension Father    Diabetes Maternal Grandmother     SOCIAL HISTORY: Social History   Socioeconomic History   Marital status: Married    Spouse name: Not on file   Number of children: Not on file   Years of education: Not on file   Highest education level: Associate degree: occupational, Scientist, product/process development, or vocational program  Occupational History   Not  on file  Tobacco Use   Smoking status: Every Day   Smokeless tobacco: Never  Substance and Sexual Activity   Alcohol use: Yes   Drug use: No   Sexual activity: Yes    Birth control/protection: Surgical  Other Topics Concern   Not on file  Social History Narrative   Not on file   Social Drivers of Health   Financial Resource Strain: Low Risk  (08/31/2023)   Overall Financial Resource Strain (CARDIA)    Difficulty of Paying Living Expenses: Not hard at all  Food Insecurity: No Food Insecurity (08/31/2023)   Hunger Vital Sign    Worried About Running Out of Food in the Last Year: Never true    Ran Out of Food in the Last Year: Never true  Transportation Needs: No  Transportation Needs (08/31/2023)   PRAPARE - Administrator, Civil Service (Medical): No    Lack of Transportation (Non-Medical): No  Physical Activity: Insufficiently Active (08/31/2023)   Exercise Vital Sign    Days of Exercise per Week: 3 days    Minutes of Exercise per Session: 20 min  Stress: No Stress Concern Present (08/31/2023)   [employer] of Occupational Health - Occupational Stress Questionnaire    Feeling of Stress : Not at all  Social Connections: Socially Integrated (08/31/2023)   Social Connection and Isolation Panel [NHANES]    Frequency of Communication with Friends and Family: More than three times a week    Frequency of Social Gatherings with Friends and Family: Once a week    Attends Religious Services: More than 4 times per year    Active Member of [employer] or Organizations: Yes    Attends Engineer, structural: More than 4 times per year    Marital Status: Married    MEDICATIONS:  Current Outpatient Medications  Medication Sig Dispense Refill   calcium carbonate (TUMS - DOSED IN MG ELEMENTAL CALCIUM) 500 MG chewable tablet Chew 2 tablets by mouth daily.     eletriptan (RELPAX) 40 MG tablet TAKE 1 TABLET BY MOUTH AT ONSET OF HEADACHE. MAY REPEAT IN 2 HOURS IF HEADACHE PERSISTS OR RECURS 10 tablet 0   HYDROcodone-acetaminophen (NORCO/VICODIN) 5-325 MG tablet Take by mouth.     hydrOXYzine (ATARAX) 10 MG tablet Take 1 tablet (10 mg total) by mouth at bedtime as needed. 30 tablet 0   Multiple Vitamins-Minerals (MULTIVITAMIN ADULT) CHEW Chew 1 each by mouth daily.     naproxen sodium (ANAPROX) 220 MG tablet Take 660 mg by mouth as needed (pain).     hydrocortisone (CORTEF) 20 MG tablet Take 1 tablet in the morning and 1/2 tab in the afternoon and take additional / double the dose in case of illness. 170 tablet 3   ketorolac (TORADOL) 10 MG tablet Take 10 mg by mouth every 6 (six) hours as needed for severe pain (pain score 7-10).     levothyroxine  (SYNTHROID) 25 MCG tablet Take 1 tablet (25 mcg total) by mouth daily before breakfast. 30 tablet 3   No current facility-administered medications for this visit.    PHYSICAL EXAM: Vitals:   09/13/23 1312  BP: 136/80  Pulse: 74  Resp: 20  SpO2: 96%  Weight: 152 lb 6.4 oz (69.1 kg)  Height: 5\' 4"  (1.626 m)   Body mass index is 26.16 kg/m.  Wt Readings from Last 3 Encounters:  09/13/23 152 lb 6.4 oz (69.1 kg)  09/01/23 153 lb 9.6 oz (69.7 kg)  07/14/23 151  lb 8 oz (68.7 kg)    General: Well developed, well nourished female in no apparent distress.  HEENT: AT/[place], no external lesions. Hearing intact to the spoken word Eyes: Conjunctiva clear and no icterus. Neck: Trachea midline, neck supple without appreciable thyromegaly or lymphadenopathy and no palpable thyroid nodules Lungs: Clear to auscultation, no wheeze. Respirations not labored Heart: S1S2, Regular in rate and rhythm. No loud murmurs Abdomen: Soft, non tender, non distended Neurologic: Alert, oriented, normal speech, deep tendon biceps reflexes normal,  no gross focal neurological deficit Extremities: No pedal pitting edema, no tremors of outstretched hands Skin: Warm, color good. Psychiatric: Does not appear depressed or anxious  PERTINENT HISTORIC LABORATORY AND IMAGING STUDIES:  All pertinent laboratory results were reviewed. Please see HPI also for further details.   TSH  Date Value Ref Range Status  09/09/2023 1.90 0.40 - 4.50 mIU/L Final  07/29/2022 1.32 0.35 - 5.50 uIU/mL Final  07/31/2021 2.46 0.35 - 5.50 uIU/mL Final     ASSESSMENT / PLAN  1. Adult onset hypothyroidism   2. Secondary adrenal insufficiency (HCC)    -Secondary adrenal insufficiency longstanding.  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  No other.  Pituitary hormone deficiency. -Continue hydrocortisone 20 mg in the morning and 10 mg in the afternoon. -Discussed to take additional dose of hydrocortisone up to 2  times the usual dose for 3 to 5 days in case of illness or increased body body stress. -Patient is going to have cataract surgery next month, advised to take additional dose of hydrocortisone for 3 to 5 days.  # Primary hypothyroidism -Continue current dose of levothyroxine 25 mcg daily.  Annual endocrinology follow-up.  Diagnoses and all orders for this visit:  Adult onset hypothyroidism -     T4, free -     TSH  Secondary adrenal insufficiency (HCC) -     hydrocortisone (CORTEF) 20 MG tablet; Take 1 tablet in the morning and 1/2 tab in the afternoon and take additional / double the dose in case of illness. -     BASIC METABOLIC PANEL WITH GFR  Other orders -     levothyroxine (SYNTHROID) 25 MCG tablet; Take 1 tablet (25 mcg total) by mouth daily before breakfast.    DISPOSITION Follow up in clinic in 12 months suggested.  All questions answered and patient verbalized understanding of the plan.  [place] Stacye Noori, MD [facility] Endocrinology [facility] [address] [place], [address] [place], [place] [address] Phone # [phone]  At least part of this note was generated using voice recognition software. Inadvertent word errors may have occurred, which were not recognized during the proofreading process.

## 2023-09-25 DIAGNOSIS — R051 Acute cough: Secondary | ICD-10-CM | POA: Diagnosis not present

## 2023-09-25 DIAGNOSIS — R0981 Nasal congestion: Secondary | ICD-10-CM | POA: Diagnosis not present

## 2023-09-25 DIAGNOSIS — E271 Primary adrenocortical insufficiency: Secondary | ICD-10-CM | POA: Diagnosis not present

## 2023-09-25 DIAGNOSIS — R509 Fever, unspecified: Secondary | ICD-10-CM | POA: Diagnosis not present

## 2023-09-26 DIAGNOSIS — R112 Nausea with vomiting, unspecified: Secondary | ICD-10-CM | POA: Diagnosis not present

## 2023-09-27 ENCOUNTER — Emergency Department (HOSPITAL_BASED_OUTPATIENT_CLINIC_OR_DEPARTMENT_OTHER)
Admission: EM | Admit: 2023-09-27 | Discharge: 2023-09-27 | Disposition: A | Discharge status: Home / Self Care | Payer: Medicare HMO | Attending: Emergency Medicine | Admitting: Emergency Medicine

## 2023-09-27 ENCOUNTER — Telehealth: Payer: Self-pay | Admitting: Surgery

## 2023-09-27 ENCOUNTER — Other Ambulatory Visit: Payer: Self-pay

## 2023-09-27 ENCOUNTER — Telehealth: Payer: Self-pay | Admitting: Family Medicine

## 2023-09-27 ENCOUNTER — Encounter (HOSPITAL_BASED_OUTPATIENT_CLINIC_OR_DEPARTMENT_OTHER): Payer: Self-pay | Admitting: Emergency Medicine

## 2023-09-27 ENCOUNTER — Ambulatory Visit: Payer: Self-pay | Admitting: Family Medicine

## 2023-09-27 DIAGNOSIS — E860 Dehydration: Secondary | ICD-10-CM | POA: Diagnosis not present

## 2023-09-27 DIAGNOSIS — R110 Nausea: Secondary | ICD-10-CM | POA: Diagnosis not present

## 2023-09-27 DIAGNOSIS — E871 Hypo-osmolality and hyponatremia: Secondary | ICD-10-CM | POA: Diagnosis not present

## 2023-09-27 DIAGNOSIS — R5383 Other fatigue: Secondary | ICD-10-CM | POA: Diagnosis not present

## 2023-09-27 DIAGNOSIS — J029 Acute pharyngitis, unspecified: Secondary | ICD-10-CM | POA: Insufficient documentation

## 2023-09-27 LAB — CBC WITH DIFFERENTIAL/PLATELET
Abs Immature Granulocytes: 0.01 10*3/uL (ref 0.00–0.07)
Basophils Absolute: 0 10*3/uL (ref 0.0–0.1)
Basophils Relative: 0 %
Eosinophils Absolute: 0.2 10*3/uL (ref 0.0–0.5)
Eosinophils Relative: 3 %
HCT: 36.7 % (ref 36.0–46.0)
Hemoglobin: 12.7 g/dL (ref 12.0–15.0)
Immature Granulocytes: 0 %
Lymphocytes Relative: 63 %
Lymphs Abs: 5.8 10*3/uL — ABNORMAL HIGH (ref 0.7–4.0)
MCH: 28.4 pg (ref 26.0–34.0)
MCHC: 34.6 g/dL (ref 30.0–36.0)
MCV: 82.1 fL (ref 80.0–100.0)
Monocytes Absolute: 0.5 10*3/uL (ref 0.1–1.0)
Monocytes Relative: 6 %
Neutro Abs: 2.5 10*3/uL (ref 1.7–7.7)
Neutrophils Relative %: 28 %
Platelets: 198 10*3/uL (ref 150–400)
RBC: 4.47 MIL/uL (ref 3.87–5.11)
RDW: 12.9 % (ref 11.5–15.5)
WBC: 9.1 10*3/uL (ref 4.0–10.5)
nRBC: 0 % (ref 0.0–0.2)

## 2023-09-27 LAB — COMPREHENSIVE METABOLIC PANEL
ALT: 31 U/L (ref 0–44)
AST: 43 U/L — ABNORMAL HIGH (ref 15–41)
Albumin: 3.3 g/dL — ABNORMAL LOW (ref 3.5–5.0)
Alkaline Phosphatase: 56 U/L (ref 38–126)
Anion gap: 8 (ref 5–15)
BUN: 7 mg/dL — ABNORMAL LOW (ref 8–23)
CO2: 25 mmol/L (ref 22–32)
Calcium: 8.7 mg/dL — ABNORMAL LOW (ref 8.9–10.3)
Chloride: 96 mmol/L — ABNORMAL LOW (ref 98–111)
Creatinine, Ser: 0.57 mg/dL (ref 0.44–1.00)
GFR, Estimated: >60 mL/min (ref >60)
Glucose, Bld: 92 mg/dL (ref 70–99)
Potassium: 3.4 mmol/L — ABNORMAL LOW (ref 3.5–5.1)
Sodium: 129 mmol/L — ABNORMAL LOW (ref 135–145)
Total Bilirubin: 0.7 mg/dL (ref 0.0–1.2)
Total Protein: 6.1 g/dL — ABNORMAL LOW (ref 6.5–8.1)

## 2023-09-27 LAB — LIPASE, BLOOD: Lipase: 45 U/L (ref 11–51)

## 2023-09-27 LAB — GROUP A STREP BY PCR: Group A Strep by PCR: NOT DETECTED

## 2023-09-27 MED ORDER — SODIUM CHLORIDE 0.9 % IV BOLUS
1000.0000 mL | Freq: Once | INTRAVENOUS | Status: AC
  Administered 2023-09-27: 1000 mL via INTRAVENOUS

## 2023-09-27 MED ORDER — ONDANSETRON HCL 4 MG/2ML IJ SOLN
4.0000 mg | Freq: Once | INTRAMUSCULAR | Status: AC
  Administered 2023-09-27: 4 mg via INTRAVENOUS
  Filled 2023-09-27: qty 2

## 2023-09-27 NOTE — Discharge Instructions (Addendum)
Increased your hydrocortisone to a double dose for the next 5 to 6 days.  Try and keep yourself hydrated.  Your sodium and potassium are a little low.  Follow-up with your doctor for recheck.

## 2023-09-27 NOTE — Telephone Encounter (Signed)
Pt will be seen at [facility]

## 2023-09-27 NOTE — ED Triage Notes (Signed)
Pt reports vomiting since Friday. Seen by PCP Saturday and Sunday. Phenergan shot on Sunday. Negative for covid flu and RSV. Sore throat and persistant nausea. No further vomiting Denies diarrhea. Denies cough and SOB Pt was given fludrocortisne tablets but has not been able to take it

## 2023-09-27 NOTE — Telephone Encounter (Signed)
Patient Name First: Elizabeth Last: Lamb Gender: Female DOB: [birth year] Age: 67 Y 10 M 14 D Return Phone Number: [phone] (Primary) Address: City/ State/ Zip: [place] [address] Client [facility] Night - C Client Site [facility] - Night Provider Meredith Staggers- MD Contact Type Call Who Is Calling Patient / Member / Family / Caregiver Call Type Triage / Clinical Relationship To Patient Self Return Phone Number [phone] (Primary) Chief Complaint Vomiting Reason for Call Symptomatic / Request for Health Information Initial Comment [name] has been vomiting since Friday, she was discharged from urgent care yesterday she was given medication but she is still vomiting, she is unable to eat without vomiting, No blood, she has urinated. Translation No Disp. Time Lamount Cohen Time) Disposition Final User 09/26/2023 1:47:07 PM Attempt made - message left Salomon Fick 09/26/2023 1:59:22 PM FINAL ATTEMPT MADE - message left Yes Salomon Fick 09/26/2023 1:59:28 PM Send to RN Final Attempt Cyril Loosen, RN, Teodoro Spray Final Disposition 09/26/2023 1:59:22 PM FINAL ATTEMPT MADE - message left Yes Carlynn Purl, RN, Teodoro Spray   Will contact ASAP

## 2023-09-27 NOTE — Telephone Encounter (Signed)
Pt refused see next nurse triage note

## 2023-09-27 NOTE — Telephone Encounter (Signed)
Chief Complaint: epigastric pain Symptoms: pain, vomiting, weakness, decreased appetite Frequency: 5 days Pertinent Negatives: Patient denies fever, CP, nausea, SOB Disposition: [] ED /[] Urgent Care (no appt availability in office) / [] Appointment(In office/virtual)/ []  [facility]/ [] Home Care/ [x] Refused Recommended Disposition /[]  Mobile Bus/ []  Follow-up with PCP Additional Notes: Pt calls stating she has been sick for 5 days, states she has had vomiting, weakness, and epigastric pain described as "feeling like something is stuck". Pt reports she was seen in urgent care twice, was given phenergan yesterday around noon, but that has worn off and she feels very sick again. States she has eaten half a cracker in 5 days, decreased fluid intake, reports she has not taken any of her medications because she does not want to vomit. States she has not vomited since 12/27, however she feels very weak and fatigued, dehydrated. Denies diarrhea, chest pain, SOB, fever.  Per protocol, pt to be evaluated within 4 hours. Next available with any provider today at 1440. Pt declines appt and states she is going to [facility] to get IV fluids. Care advice reviewed with pt. Pt requesting callback from provider. Alerting PCP for review.   Copied from CRM [id number]. Topic: Clinical - Red Word Triage >> Sep 27, 2023  8:47 AM Lennart Pall wrote: Reason for CRM: PT has not eaten more than half a cracker or bite of toast since last thursday. Has been throwing up. She said she feels like she has been run over by a truck. Went to the urgent care saturday and sunday. She got a shot for nausea and that is better, but she still does not feel better physically. Reason for Disposition  [1] MILD-MODERATE pain AND [2] constant AND [3] present > 2 hours  Answer Assessment - Initial Assessment Questions 1. LOCATION: "Where does it hurt?"      epigastric 2. RADIATION: "Does the pain shoot anywhere  else?" (e.g., chest, back)     Nonradiating, stays in the middle 3. ONSET: "When did the pain begin?" (e.g., minutes, hours or days ago)      This morning 4. SUDDEN: "Gradual or sudden onset?"     unsure 5. PATTERN "Does the pain come and go, or is it constant?"    - If it comes and goes: "How long does it last?" "Do you have pain now?"     (Note: Comes and goes means the pain is intermittent. It goes away completely between bouts.)    - If constant: "Is it getting better, staying the same, or getting worse?"      (Note: Constant means the pain never goes away completely; most serious pain is constant and gets worse.)      Comes and goes with turning, pain is the same 6. SEVERITY: "How bad is the pain?"  (e.g., Scale 1-10; mild, moderate, or severe)    - MILD (1-3): Doesn't interfere with normal activities, abdomen soft and not tender to touch..     - MODERATE (4-7): Interferes with normal activities or awakens from sleep, abdomen tender to touch.     - SEVERE (8-10): Excruciating pain, doubled over, unable to do any normal activities.       5/10 7. RECURRENT SYMPTOM: "Have you ever had this type of stomach pain before?" If Yes, ask: "When was the last time?" and "What happened that time?"      Denies 8. AGGRAVATING FACTORS: "Does anything seem to cause this pain?" (e.g., foods, stress, alcohol)  Turning makes it feel worse, no relieving actions 9. CARDIAC SYMPTOMS: "Do you have any of the following symptoms: chest pain, difficulty breathing, sweating, nausea?"     Denies, but had vomiting up until 12/28 10. OTHER SYMPTOMS: "Do you have any other symptoms?" (e.g., back pain, diarrhea, fever, urination pain, vomiting)       Vomiting  Protocols used: Abdominal Pain - Upper-A-AH

## 2023-09-27 NOTE — Telephone Encounter (Signed)
Received call from patient concerning needing a work note. ED RNCM provided work note, no futher RNCM neds identified.

## 2023-09-27 NOTE — ED Provider Notes (Signed)
[facility] Provider Note   CSN: [id number] Arrival date & time: 09/27/23  4098     History  Chief Complaint  Patient presents with   Sore Throat   Nausea    Elizabeth Lamb is a 67 y.o. female.   Sore Throat  Patient presents with sore throat nausea and decreased oral intake for around 4 days now.  Had been seen at urgent care twice.  Had negative flu COVID and RSV testing.  Had shot of Phenergan.  Has not been taking her hydrocortisone since she had been vomiting.  History of adrenal insufficiency.  Had been seen in urgent care and given Florinef.  Decreased oral intake.  Increased fatigue.  No diarrhea.  Does have sore throat.     Home Medications Prior to Admission medications   Medication Sig Start Date End Date Taking? Authorizing Provider  calcium carbonate (TUMS - DOSED IN MG ELEMENTAL CALCIUM) 500 MG chewable tablet Chew 2 tablets by mouth daily.    [provider]  eletriptan (RELPAX) 40 MG tablet TAKE 1 TABLET BY MOUTH AT ONSET OF HEADACHE. MAY REPEAT IN 2 HOURS IF HEADACHE PERSISTS OR RECURS 01/04/23   Reather Littler, MD  HYDROcodone-acetaminophen (NORCO/VICODIN) 5-325 MG tablet Take by mouth. 05/27/21   [provider]  hydrocortisone (CORTEF) 20 MG tablet Take 1 tablet in the morning and 1/2 tab in the afternoon and take additional / double the dose in case of illness. 09/13/23   Thapa, [place], MD  hydrOXYzine (ATARAX) 10 MG tablet Take 1 tablet (10 mg total) by mouth at bedtime as needed. 07/14/23   Shade Flood, MD  ketorolac (TORADOL) 10 MG tablet Take 10 mg by mouth every 6 (six) hours as needed for severe pain (pain score 7-10).    [provider]  levothyroxine (SYNTHROID) 25 MCG tablet Take 1 tablet (25 mcg total) by mouth daily before breakfast. 09/13/23   Thapa, [place], MD  Multiple Vitamins-Minerals (MULTIVITAMIN ADULT) CHEW Chew 1 each by mouth daily.    [provider]   naproxen sodium (ANAPROX) 220 MG tablet Take 660 mg by mouth as needed (pain).    [provider]      Allergies    Vibramycin [doxycycline calcium]    Review of Systems   Review of Systems  Physical Exam Updated Vital Signs BP 110/70   Pulse 66   Temp 98.3 F (36.8 C) (Oral)   Resp 17   Ht 5\' 4"  (1.626 m)   Wt 69.1 kg   SpO2 98%   BMI 26.16 kg/m  Physical Exam Vitals and nursing note reviewed.  HENT:     Head: Atraumatic.  Cardiovascular:     Rate and Rhythm: Normal rate.  Pulmonary:     Breath sounds: No wheezing.  Abdominal:     Tenderness: There is no abdominal tenderness. There is no guarding.  Skin:    Capillary Refill: Capillary refill takes less than 2 seconds.  Neurological:     Mental Status: She is alert and oriented to person, place, and time.     ED Results / Procedures / Treatments   Labs (all labs ordered are listed, but only abnormal results are displayed) Labs Reviewed  CBC WITH DIFFERENTIAL/PLATELET - Abnormal; Notable for the following components:      Result Value   Lymphs Abs 5.8 (*)    All other components within normal limits  COMPREHENSIVE METABOLIC PANEL - Abnormal; Notable for the  following components:   Sodium 129 (*)    Potassium 3.4 (*)    Chloride 96 (*)    BUN 7 (*)    Calcium 8.7 (*)    Total Protein 6.1 (*)    Albumin 3.3 (*)    AST 43 (*)    All other components within normal limits  GROUP A STREP BY PCR  LIPASE, BLOOD  URINALYSIS, ROUTINE W REFLEX MICROSCOPIC    EKG None  Radiology No results found.  Procedures Procedures    Medications Ordered in ED Medications  ondansetron (ZOFRAN) injection 4 mg (4 mg Intravenous Given 09/27/23 1052)  sodium chloride 0.9 % bolus 1,000 mL (0 mLs Intravenous Stopped 09/27/23 1151)    ED Course/ Medical Decision Making/ A&P                                 Medical Decision Making Amount and/or Complexity of Data Reviewed Labs: ordered.  Risk Prescription  drug management.   Patient with nausea and vomiting with decreased oral intake.  Around 4 days of symptoms now.  No definite sick contacts.  Has been at urgent care twice.  History of adrenal insufficiency.  Has not had her stress dose steroids and in fact had less steroids  Patient feels much better after fluid bolus.  Does have some hyponatremia and hypocalcemia.  States she will take her steroids at home.  Able to ambulate and feeling better.  Blood pressure improved.  Will have her call her PCP to follow-up with the sodium and will discuss with endocrinology about if they want her to do the Florinef although she will increase the hydrocortisone as planned.  Will discharge.        Final Clinical Impression(s) / ED Diagnoses Final diagnoses:  Dehydration  Hyponatremia    Rx / DC Orders ED Discharge Orders     None         Benjiman Core, MD 09/27/23 1236

## 2023-10-11 ENCOUNTER — Ambulatory Visit (INDEPENDENT_AMBULATORY_CARE_PROVIDER_SITE_OTHER): Payer: Medicare HMO | Admitting: Family Medicine

## 2023-10-11 VITALS — BP 126/74 | HR 96 | Temp 98.2°F | Ht 64.0 in | Wt 149.4 lb

## 2023-10-11 DIAGNOSIS — E876 Hypokalemia: Secondary | ICD-10-CM | POA: Diagnosis not present

## 2023-10-11 DIAGNOSIS — E871 Hypo-osmolality and hyponatremia: Secondary | ICD-10-CM | POA: Diagnosis not present

## 2023-10-11 DIAGNOSIS — R7989 Other specified abnormal findings of blood chemistry: Secondary | ICD-10-CM | POA: Diagnosis not present

## 2023-10-11 DIAGNOSIS — Z87898 Personal history of other specified conditions: Secondary | ICD-10-CM | POA: Diagnosis not present

## 2023-10-11 NOTE — Patient Instructions (Addendum)
 Glad you are feeling better. If any concerns on labs I will let you know. Take care and congrats on the new granddaughter (and tell Joy congrats on the new baby!)

## 2023-10-11 NOTE — Progress Notes (Signed)
 Subjective:  Patient ID: Elizabeth Lamb, female    DOB: 1956-03-07  Age: 68 y.o. MRN: [id number]  CC:  Chief Complaint  Patient presents with   Hospitalization Follow-up    Pt went to [facility] medcenter got sick end of December and went to [facility] med center the weekend following Christmas, pt is back to normal but was told to have repeat labs done with PCP     HPI [name] presents for  ER follow-up.  Note reviewed from 09/27/2023. Suspected Viral illness  Sore throat, nausea, decreased oral intake for 4 days.  Negative flu and COVID testing, negative RSV testing.  History of adrenal insufficiency had been off hydrocortisone  due to vomiting, had received a shot of Phenergan  at urgent care.  Labs in ER reviewed including sodium 129, potassium 3.4, slight elevated AST at 43, I been 3.3, total protein 6.1, calcium 8.7.  Treated with IV fluids, plan to restart steroids at home with history of adrenal insufficiency and had not been on her stress dose steroids, in fact have less steroids.  Plan for follow-up with me to recheck labs including hyponatremia and discuss the steroid dosing strength with her endocrinologist.  Has stress dosed the cortef  - doubled for 5 days. Back to normal now and feeling better.    Feels well since ER visit, just needs labs rechecked as above. No recent n/v/d. 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  Authorizing Provider  calcium carbonate (TUMS - DOSED IN MG ELEMENTAL CALCIUM) 500 MG chewable tablet Chew 2 tablets by mouth daily.   Yes [provider]  eletriptan  (RELPAX ) 40 MG tablet TAKE 1 TABLET BY MOUTH AT ONSET OF HEADACHE. MAY REPEAT IN 2 HOURS IF HEADACHE PERSISTS OR RECURS 01/04/23  Yes Von Pacific, MD  HYDROcodone-acetaminophen  (NORCO/VICODIN) 5-325 MG tablet Take by mouth. 05/27/21  Yes [provider]  hydrocortisone  (CORTEF ) 20 MG tablet Take 1 tablet in the morning and 1/2 tab in the afternoon and take additional / double the dose in case of illness. 09/13/23  Yes Thapa, [place], MD  hydrOXYzine  (ATARAX ) 10 MG tablet Take 1 tablet (10 mg total) by mouth at bedtime as needed. 07/14/23  Yes Levora Reyes SAUNDERS, MD  levothyroxine  (SYNTHROID ) 25 MCG tablet Take 1 tablet (25 mcg total) by mouth daily before breakfast. 09/13/23  Yes Thapa, [place], MD  Multiple Vitamins-Minerals (MULTIVITAMIN ADULT) CHEW Chew 1 each by mouth daily.   Yes [provider]  naproxen sodium (ANAPROX) 220 MG tablet Take 660 mg by mouth as needed (pain).   Yes [provider]   Social History   Socioeconomic History   Marital status: Married    Spouse name: Not on file   Number of children: Not on file   Years of education: Not on file   Highest education level: Associate degree:  occupational, scientist, product/process development, or vocational program  Occupational History   Not on file  Tobacco Use   Smoking status: Every Day   Smokeless tobacco: Never  Substance and Sexual Activity   Alcohol use: Yes   Drug use: No   Sexual activity: Yes    Birth control/protection: Surgical  Other Topics Concern   Not on file  Social History Narrative   Not on file   Social Drivers of Health   Financial Resource Strain: Low Risk  (08/31/2023)   Overall Financial Resource Strain (CARDIA)    Difficulty of Paying Living Expenses: Not hard at all  Food Insecurity: No Food Insecurity (08/31/2023)   Hunger  Vital Sign    Worried About Running Out of Food in the Last Year: Never true    Ran Out of Food in the Last Year: Never true  Transportation Needs: No Transportation Needs (08/31/2023)   PRAPARE - Administrator, Civil Service (Medical): No    Lack of Transportation (Non-Medical): No  Physical Activity: Insufficiently Active (08/31/2023)   Exercise Vital Sign    Days of Exercise per Week: 3 days    Minutes of Exercise per Session: 20 min  Stress: No Stress Concern Present (08/31/2023)   [employer] of Occupational Health - Occupational Stress Questionnaire    Feeling of Stress : Not at all  Social Connections: Socially Integrated (08/31/2023)   Social Connection and Isolation Panel [NHANES]    Frequency of Communication with Friends and Family: More than three times a week    Frequency of Social Gatherings with Friends and Family: Once a week    Attends Religious Services: More than 4 times per year    Active Member of [employer] or Organizations: Yes    Attends Banker Meetings: More than 4 times per year    Marital Status: Married  Catering Manager Violence: Not At Risk (08/19/2023)   Humiliation, Afraid, Rape, and Kick questionnaire    Fear of Current or Ex-Partner: No    Emotionally Abused: No    Physically Abused: No    Sexually Abused: No    Review of Systems Per HPI.   Objective:   Vitals:   10/11/23 1439  BP: 126/74  Pulse: 96  Temp: 98.2 F (36.8 C)  TempSrc: Temporal  SpO2: 94%  Weight: 149 lb 6.4 oz (67.8 kg)  Height: 5' 4 (1.626 m)     Physical Exam Vitals reviewed.  Constitutional:      Appearance: Normal appearance. She is well-developed.  HENT:     Head: Normocephalic and atraumatic.  Eyes:     Conjunctiva/sclera: Conjunctivae normal.     Pupils: Pupils are equal, round, and reactive to light.  Neck:     Vascular: No carotid bruit.  Cardiovascular:     Rate and Rhythm: Normal rate and regular rhythm.     Heart sounds:  Normal heart sounds.  Pulmonary:     Effort: Pulmonary effort is normal.     Breath sounds: Normal breath sounds.  Abdominal:     Palpations: Abdomen is soft. There is no pulsatile mass.     Tenderness: There is no abdominal tenderness.  Musculoskeletal:     Right lower leg: No edema.     Left lower leg: No edema.  Skin:    General: Skin is warm and dry.  Neurological:     Mental Status: She is alert and oriented to person, place, and time.  Psychiatric:  Mood and Affect: Mood normal.        Behavior: Behavior normal.      Assessment & Plan:  DALILA ARCA is a 68 y.o. female . Hyponatremia - Plan: Comprehensive metabolic panel  Hypokalemia - Plan: Comprehensive metabolic panel  History of nausea and vomiting - Plan: Comprehensive metabolic panel  LFT elevation - Plan: Comprehensive metabolic panel  Hypocalcemia - Plan: Comprehensive metabolic panel  -Possible viral illness with nausea, vomiting, requirement of IV fluids.  Hyponatremia, hypokalemia, hypocalcemia noted on prior labs.  Symptoms have resolved and she has completed stress dosing of her steroid, back on standard dosing and feeling well currently.  Check updated CMP for prior lab abnormalities, RTC precautions given.  No orders of the defined types were placed in this encounter.  Patient Instructions  Glad you are feeling better. If any concerns on labs I will let you know. Take care and congrats on the new granddaughter (and tell Joy congrats on the new baby!)      Signed,   Reyes Pines, MD [facility], [facility] 10/11/23 3:24 PM

## 2023-10-12 ENCOUNTER — Encounter: Payer: Self-pay | Admitting: Family Medicine

## 2023-10-12 LAB — COMPREHENSIVE METABOLIC PANEL
ALT: 15 U/L (ref 0–35)
AST: 25 U/L (ref 0–37)
Albumin: 4.5 g/dL (ref 3.5–5.2)
Alkaline Phosphatase: 54 U/L (ref 39–117)
BUN: 9 mg/dL (ref 6–23)
CO2: 30 meq/L (ref 19–32)
Calcium: 10.1 mg/dL (ref 8.4–10.5)
Chloride: 102 meq/L (ref 96–112)
Creatinine, Ser: 0.64 mg/dL (ref 0.40–1.20)
GFR: 90.49 mL/min (ref >60.00)
Glucose, Bld: 96 mg/dL (ref 70–99)
Potassium: 4.2 meq/L (ref 3.5–5.1)
Sodium: 140 meq/L (ref 135–145)
Total Bilirubin: 0.4 mg/dL (ref 0.2–1.2)
Total Protein: 7.1 g/dL (ref 6.0–8.3)

## 2023-10-28 DIAGNOSIS — M1812 Unilateral primary osteoarthritis of first carpometacarpal joint, left hand: Secondary | ICD-10-CM | POA: Diagnosis not present

## 2023-10-28 DIAGNOSIS — M1832 Unilateral post-traumatic osteoarthritis of first carpometacarpal joint, left hand: Secondary | ICD-10-CM | POA: Diagnosis not present

## 2023-11-29 DIAGNOSIS — H2511 Age-related nuclear cataract, right eye: Secondary | ICD-10-CM | POA: Diagnosis not present

## 2023-11-29 DIAGNOSIS — H43811 Vitreous degeneration, right eye: Secondary | ICD-10-CM | POA: Diagnosis not present

## 2023-12-07 ENCOUNTER — Telehealth: Payer: Self-pay

## 2023-12-07 NOTE — Telephone Encounter (Signed)
 Yes that recommendation is still apply, take hydrocortisone double the dose for 5 days starting from the morning of the day of surgery, which means she can start taking double the dose from April 22 in the morning.  Iraq Trayven Lumadue, MD [facility] End[facility] [address] [place], [address] [place], [place] [address] Phone # [phone]

## 2023-12-07 NOTE — Telephone Encounter (Signed)
 Patient given advisement for medication management as directed by MD. No further question at this time.

## 2023-12-07 NOTE — Telephone Encounter (Signed)
 Patient left message stating that she was having cataract surgery in April and just wanted to clarify what was discussed during visit.

## 2023-12-19 DIAGNOSIS — M1812 Unilateral primary osteoarthritis of first carpometacarpal joint, left hand: Secondary | ICD-10-CM | POA: Diagnosis not present

## 2023-12-29 DIAGNOSIS — Z1231 Encounter for screening mammogram for malignant neoplasm of breast: Secondary | ICD-10-CM | POA: Diagnosis not present

## 2023-12-29 DIAGNOSIS — Z01419 Encounter for gynecological examination (general) (routine) without abnormal findings: Secondary | ICD-10-CM | POA: Diagnosis not present

## 2023-12-29 DIAGNOSIS — Z1331 Encounter for screening for depression: Secondary | ICD-10-CM | POA: Diagnosis not present

## 2023-12-29 LAB — HM MAMMOGRAPHY

## 2024-01-12 DIAGNOSIS — M810 Age-related osteoporosis without current pathological fracture: Secondary | ICD-10-CM | POA: Diagnosis not present

## 2024-01-18 DIAGNOSIS — H25811 Combined forms of age-related cataract, right eye: Secondary | ICD-10-CM | POA: Diagnosis not present

## 2024-02-07 ENCOUNTER — Other Ambulatory Visit: Payer: Self-pay | Admitting: Endocrinology

## 2024-02-12 DIAGNOSIS — R059 Cough, unspecified: Secondary | ICD-10-CM | POA: Diagnosis not present

## 2024-02-12 DIAGNOSIS — R070 Pain in throat: Secondary | ICD-10-CM | POA: Diagnosis not present

## 2024-02-15 DIAGNOSIS — R059 Cough, unspecified: Secondary | ICD-10-CM | POA: Diagnosis not present

## 2024-02-15 DIAGNOSIS — R0981 Nasal congestion: Secondary | ICD-10-CM | POA: Diagnosis not present

## 2024-02-15 DIAGNOSIS — R062 Wheezing: Secondary | ICD-10-CM | POA: Diagnosis not present

## 2024-02-28 ENCOUNTER — Ambulatory Visit (INDEPENDENT_AMBULATORY_CARE_PROVIDER_SITE_OTHER): Payer: Medicare HMO | Admitting: Family Medicine

## 2024-02-28 VITALS — BP 128/68 | HR 61 | Temp 98.2°F | Ht 64.0 in | Wt 165.4 lb

## 2024-02-28 DIAGNOSIS — Z8701 Personal history of pneumonia (recurrent): Secondary | ICD-10-CM

## 2024-02-28 DIAGNOSIS — G4700 Insomnia, unspecified: Secondary | ICD-10-CM | POA: Diagnosis not present

## 2024-02-28 DIAGNOSIS — M810 Age-related osteoporosis without current pathological fracture: Secondary | ICD-10-CM | POA: Diagnosis not present

## 2024-02-28 DIAGNOSIS — M79642 Pain in left hand: Secondary | ICD-10-CM | POA: Diagnosis not present

## 2024-02-28 NOTE — Progress Notes (Signed)
 Subjective:  Patient ID: Elizabeth Lamb, female    DOB: 1956-05-11  Age: 68 y.o. MRN: [id number]  CC:  Chief Complaint  Patient presents with   Follow-up    Patient states nothing to discuss.     HPI Elizabeth Lamb presents for  Follow-up.  Last visit was ER follow-up from suspected viral illness.  Seen in January from December 30 ER visit.  Hyponatremia, elevated LFTs improved at that time.  Had doubled her dose of Cortef  for stress dosing during illness, followed by endocrinology.  Adrenal insufficiency, hypothyroidism, endocrinology Dr. Aretha Kubas  Insomnia Discussed at previous visits, intermittent RLS symptoms.  Melatonin, hydroxyzine  has been discussed previously.  Option of hydroxyzine  if needed. No recent sleep issues.  Followed by Lamb specialist for left [facility] artrhitis - planned repair next year. Recent injection last week. Some relief. Has hydrocodone if needed.   Treated for sinus infection few weeks ago. Went back after some initial improvement, then PNA - zpak, rocephin , prednisone, albuterol inhaler on 5/20 - [facility] in [place].  Doing better - no fevers, no dyspnea at this time. Back to normal - no need for albuterol recently.  No follow up planned.   Cataract surgery on 4/22  - Dr. Candi Chafe. June 17th planned other cataract repair.   HM:  Mammogram in April.  Bone density at [facility] in April - told had osteoporosis, ordered by GYN, appt with GYN tomorrow - Dr. Arnett Lanius at [facility] to discuss treatment.   Colonoscopy next week - 6/12 Immunization History  Administered Date(s) Administered   Influenza, High Dose Seasonal PF 07/17/2020   Influenza,inj,Quad PF,6+ Mos 11/09/2014, 06/20/2019   Influenza-Unspecified 07/10/2015, 07/22/2023   PPD Test 01/27/2013   Pneumococcal Conjugate-13 06/20/2019   Tdap 07/22/2023   Zoster, Live 11/09/2014  Pneumonia vaccine at Dr. Kem Patten office?  Zostavax in 2016.    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 Authorizing Provider  calcium carbonate (TUMS - DOSED IN MG ELEMENTAL CALCIUM) 500 MG chewable tablet Chew 2 tablets by mouth daily.   Yes [provider]  hydrocortisone  (CORTEF ) 20 MG tablet Take 1 tablet in the morning and 1/2 tab in the afternoon and take additional / double the dose in case of illness. 09/13/23  Yes Thapa, [place], MD  levothyroxine  (SYNTHROID ) 25 MCG tablet TAKE 1 TABLET BY MOUTH DAILY BEFORE BREAKFAST 02/07/24  Yes Thapa, [place], MD  Multiple Vitamins-Minerals (MULTIVITAMIN ADULT) CHEW Chew 1 each by mouth daily.   Yes [provider]  eletriptan  (RELPAX ) 40 MG tablet TAKE 1 TABLET BY MOUTH AT ONSET OF HEADACHE. MAY REPEAT IN 2 HOURS IF HEADACHE PERSISTS OR RECURS Patient not taking: Reported on 02/28/2024 01/04/23   Lajean Pike, MD  HYDROcodone-acetaminophen  (NORCO/VICODIN) 5-325 MG tablet Take by mouth. Patient not taking: Reported on 02/28/2024 05/27/21   [provider]  hydrOXYzine  (ATARAX ) 10 MG  tablet Take 1 tablet (10 mg total) by mouth at bedtime as needed. Patient not taking: Reported on 02/28/2024 07/14/23   Benjiman Bras, MD  naproxen sodium (ANAPROX) 220 MG tablet Take 660 mg by mouth as needed (pain). Patient not taking: Reported on 02/28/2024    [provider]   Social History   Socioeconomic History   Marital status: Married    Spouse name: Not on file   Number of children: Not on file   Years of education:  Not on file   Highest education level: Associate degree: academic program  Occupational History   Not on file  Tobacco Use   Smoking status: Every Day   Smokeless tobacco: Never  Substance and Sexual Activity   Alcohol use: Yes   Drug use: No   Sexual activity: Yes    Birth control/protection: Surgical  Other Topics Concern   Not on file  Social History Narrative   Not on file   Social Drivers of Health   Financial Resource Strain: Low Risk  (02/26/2024)   Overall Financial Resource Strain (CARDIA)    Difficulty of Paying Living Expenses: Not hard at all  Food Insecurity: No Food Insecurity (02/26/2024)   Hunger Vital Sign    Worried About Running Out of Food in the Last Year: Never true    Ran Out of Food in the Last Year: Never true  Transportation Needs: No Transportation Needs (02/26/2024)   PRAPARE - Administrator, Civil Service (Medical): No    Lack of Transportation (Non-Medical): No  Physical Activity: Insufficiently Active (02/26/2024)   Exercise Vital Sign    Days of Exercise per Week: 1 day    Minutes of Exercise per Session: 20 min  Stress: No Stress Concern Present (02/26/2024)   [employer] of Occupational Health - Occupational Stress Questionnaire    Feeling of Stress : Not at all  Social Connections: Socially Integrated (02/26/2024)   Social Connection and Isolation Panel [NHANES]    Frequency of Communication with Friends and Family: More than three times a week    Frequency of Social Gatherings with Friends and Family: Twice a week    Attends Religious Services: More than 4 times per year    Active Member of [employer] or Organizations: Yes    Attends Engineer, structural: More than 4 times per year    Marital Status: Married  Catering manager Violence: Not At Risk (08/19/2023)   Humiliation, Afraid, Rape, and Kick questionnaire    Fear of Current or Ex-Partner: No    Emotionally Abused: No    Physically Abused: No    Sexually  Abused: No    Review of Systems Per HPI   Objective:   Vitals:   02/28/24 1414  BP: 128/68  Pulse: 61  Temp: 98.2 F (36.8 C)  TempSrc: Oral  SpO2: 97%  Weight: 165 lb 6.4 oz (75 kg)  Height: 5\' 4"  (1.626 m)     Physical Exam Vitals reviewed.  Constitutional:      Appearance: Normal appearance. She is well-developed.  HENT:     Head: Normocephalic and atraumatic.  Eyes:     Conjunctiva/sclera: Conjunctivae normal.     Pupils: Pupils are equal, round, and reactive to light.  Neck:     Vascular: No carotid bruit.  Cardiovascular:     Rate and Rhythm: Normal rate and regular rhythm.     Heart sounds: Normal heart sounds.  Pulmonary:     Effort:  Pulmonary effort is normal.     Breath sounds: Normal breath sounds.     Comments: Lungs clear, no rhonchi, no wheezes.  Normal effort no distress. Abdominal:     Palpations: Abdomen is soft. There is no pulsatile mass.     Tenderness: There is no abdominal tenderness.  Musculoskeletal:     Right lower leg: No edema.     Left lower leg: No edema.  Skin:    General: Skin is warm and dry.  Neurological:     Mental Status: She is alert and oriented to person, place, and time.  Psychiatric:        Mood and Affect: Mood normal.        Behavior: Behavior normal.        Assessment & Plan:  Elizabeth Lamb is a 68 y.o. female . Insomnia, unspecified type  - Overall well-controlled at this time.  Option of hydroxyzine  but no recent meds needed.  RTC precautions.  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  Recent injection.  Osteoporosis, unspecified osteoporosis type, unspecified pathological fracture presence  - Recent diagnosis of osteoporosis by report on her bone density, plan for follow-up with gynecologist to discuss treatment plan, but she is also followed by endocrinology if medication assistance needed given her underlying adrenal  insufficiency, use of hydrocortisone .  History of bacterial pneumonia  - Clinically improved, lungs clear in office.  Given smoking history it may be worthwhile to repeat chest x-ray in 1 month.  Will try to obtain records from previous provider including x-ray report of possible for comparison.  - Pneumonia vaccine discussed, option to have performed at pharmacy or nurse visit.  Shingles vaccine at pharmacy also discussed.  Physical in 6 months, RTC precautions.  No orders of the defined types were placed in this encounter.  Patient Instructions  Thanks for coming in today.  We will try to get the xray report, but would consider repeat chest xray in 1 month. I will try to get the report first.  Take care! Let me know if any questions. Physical in 6 months. Take care and tell the family hello.  Shingles vaccine at your pharmacy. If you have not had the Prevnar 20 vaccine, I recommend it (at pharmacy or here).       Signed,   Caro Christmas, MD [facility], [facility] 02/29/24 10:50 AM

## 2024-02-28 NOTE — Patient Instructions (Addendum)
 Thanks for coming in today.  We will try to get the xray report, but would consider repeat chest xray in 1 month. I will try to get the report first.  Take care! Let me know if any questions. Physical in 6 months. Take care and tell the family hello.  Shingles vaccine at your pharmacy. If you have not had the Prevnar 20 vaccine, I recommend it (at pharmacy or here).

## 2024-02-29 ENCOUNTER — Encounter: Payer: Self-pay | Admitting: Family Medicine

## 2024-02-29 DIAGNOSIS — M810 Age-related osteoporosis without current pathological fracture: Secondary | ICD-10-CM | POA: Diagnosis not present

## 2024-02-29 DIAGNOSIS — M8580 Other specified disorders of bone density and structure, unspecified site: Secondary | ICD-10-CM | POA: Diagnosis not present

## 2024-03-09 DIAGNOSIS — Z1211 Encounter for screening for malignant neoplasm of colon: Secondary | ICD-10-CM | POA: Diagnosis not present

## 2024-03-09 DIAGNOSIS — H2512 Age-related nuclear cataract, left eye: Secondary | ICD-10-CM | POA: Diagnosis not present

## 2024-03-09 DIAGNOSIS — D123 Benign neoplasm of transverse colon: Secondary | ICD-10-CM | POA: Diagnosis not present

## 2024-03-09 LAB — HM COLONOSCOPY

## 2024-03-13 DIAGNOSIS — D123 Benign neoplasm of transverse colon: Secondary | ICD-10-CM | POA: Diagnosis not present

## 2024-03-14 DIAGNOSIS — H25812 Combined forms of age-related cataract, left eye: Secondary | ICD-10-CM | POA: Diagnosis not present

## 2024-04-03 ENCOUNTER — Ambulatory Visit: Payer: Self-pay | Admitting: *Deleted

## 2024-04-03 MED ORDER — VALACYCLOVIR HCL 1 G PO TABS
2000.0000 mg | ORAL_TABLET | Freq: Two times a day (BID) | ORAL | 1 refills | Status: AC
Start: 2024-04-03 — End: 2024-04-04

## 2024-04-03 NOTE — Telephone Encounter (Signed)
 Attempted to contact patient- no answer- left message to call office  Copied from CRM [id number]. Topic: Clinical - Medical Advice >> Apr 03, 2024  9:52 AM Robinson H wrote: Reason for CRM: Patient states she has a fever blister coming and wants to know if Dr. Levora can call in valacyclovir  or something for it, states she's had the valacyclovir  before but Dr. Levora has never written it for her.  Ermelinda [phone]

## 2024-04-03 NOTE — Telephone Encounter (Signed)
 Called pt to inform her this will need office visit. Pt refused and hung up phone  Sent to Dr.Greene as FYI

## 2024-04-03 NOTE — Telephone Encounter (Signed)
 FYI Only or Action Required?: Action required by provider: medication refill request.  Patient was last seen in primary care on 02/28/2024 by Levora Reyes SAUNDERS, MD.  Called Nurse Triage reporting fever blister (Medication request, fever blister).  Symptoms began yesterday.  Interventions attempted: Nothing.  Symptoms are: unchanged.  Triage Disposition: Home Care  Patient/caregiver understands and will follow disposition?: No, wishes to speak with PCP   Reason for Disposition  Cold sores without complications  Answer Assessment - Initial Assessment Questions 1. APPEARANCE of BLISTERS: Describe the sores.     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 SIZE: How large an area is involved with the cold sores? (e.g., inches, cm or compare to coins)     Half the size of a dime 3. LOCATION: Which part of the lip is involved?     Upper lip 4. ONSET: When did the fever blisters begin?     Started yesterday evening 5. RECURRENT BLISTERS: Have you had fever blisters before? If Yes, ask: When was the last time? How many times a year?     Yes long time ago 6. OTHER SYMPTOMS: Do you have any other symptoms? (e.g., fever, sores inside mouth)     denies  Protocols used: Cold Sores (Fever Blisters)-A-AH

## 2024-04-03 NOTE — Telephone Encounter (Signed)
 Called patient.  Last cold sore 5-6 years ago. Prior treatment with valtrex . Upper lip on left side with slight swelling feeling early blister is forming - started last night. No face swelling, no other rash, no intraoral rash or of throat. Feels similar to prior cold sore.  Given need to start medicines quickly for cold sores for most effective treatment, I did call patient to obtain information above and sent medication to pharmacy tonight.  I called medication into [facility]. Valtrex  2 grams now, repeat in 12 hours. #4, 1 rf ordered.  Understanding of plan expressed.

## 2024-04-03 NOTE — Addendum Note (Signed)
 Addended by: Clem Wisenbaker R on: 04/03/2024 05:28 PM   Modules accepted: Orders

## 2024-04-05 ENCOUNTER — Other Ambulatory Visit: Payer: Self-pay | Admitting: Endocrinology

## 2024-04-05 ENCOUNTER — Telehealth: Payer: Self-pay

## 2024-04-05 NOTE — Telephone Encounter (Signed)
 Pharmacy sent in request for refill of this medication called Relpax  that patient uses for migraines. Called and explained to patient that typically MD will not renew a RX if he was not treating patient for the medical concern. Per patient she useto get it from MD who has now retired. Explained to patient current MD is on vacation, but I would send it to the MD on call to advise.

## 2024-04-05 NOTE — Telephone Encounter (Signed)
 Spoke to patient to make her aware of the MD's decision. No further questions or concerns from the patient

## 2024-07-01 DIAGNOSIS — R070 Pain in throat: Secondary | ICD-10-CM | POA: Diagnosis not present

## 2024-07-03 DIAGNOSIS — R070 Pain in throat: Secondary | ICD-10-CM | POA: Diagnosis not present

## 2024-07-06 DIAGNOSIS — R0981 Nasal congestion: Secondary | ICD-10-CM | POA: Diagnosis not present

## 2024-07-06 DIAGNOSIS — J019 Acute sinusitis, unspecified: Secondary | ICD-10-CM | POA: Diagnosis not present

## 2024-07-06 DIAGNOSIS — J209 Acute bronchitis, unspecified: Secondary | ICD-10-CM | POA: Diagnosis not present

## 2024-08-15 ENCOUNTER — Telehealth: Payer: Self-pay

## 2024-08-15 DIAGNOSIS — R0981 Nasal congestion: Secondary | ICD-10-CM | POA: Diagnosis not present

## 2024-08-15 DIAGNOSIS — R059 Cough, unspecified: Secondary | ICD-10-CM | POA: Diagnosis not present

## 2024-08-15 NOTE — Telephone Encounter (Signed)
 Yes, she needs to take double dose of her hydrocortisone  for 3 to 5 days.

## 2024-08-15 NOTE — Telephone Encounter (Signed)
 The patient visited urgent care and was prescribed antibiotics and cough medication. She wants to know if she should take prednisone since she is not experiencing any fever, and whether she needs to increase her hydrocortisone . Previously, Dr. Von advised her to double the hydrocortisone  dose. She would like clarification on whether that still applies and if this will be the protocol moving forward. Please call the patient to verify

## 2024-08-16 NOTE — Telephone Encounter (Signed)
 Patient given advisement from MD. No further questions at this time.

## 2024-08-17 DIAGNOSIS — M1812 Unilateral primary osteoarthritis of first carpometacarpal joint, left hand: Secondary | ICD-10-CM | POA: Diagnosis not present

## 2024-08-17 DIAGNOSIS — M180 Bilateral primary osteoarthritis of first carpometacarpal joints: Secondary | ICD-10-CM | POA: Diagnosis not present

## 2024-08-17 DIAGNOSIS — M79645 Pain in left finger(s): Secondary | ICD-10-CM | POA: Diagnosis not present

## 2024-08-17 DIAGNOSIS — M1832 Unilateral post-traumatic osteoarthritis of first carpometacarpal joint, left hand: Secondary | ICD-10-CM | POA: Diagnosis not present

## 2024-08-29 ENCOUNTER — Encounter

## 2024-08-30 ENCOUNTER — Encounter: Payer: Self-pay | Admitting: Family Medicine

## 2024-08-30 ENCOUNTER — Ambulatory Visit: Admitting: Family Medicine

## 2024-08-30 VITALS — BP 122/60 | HR 88 | Temp 98.0°F | Resp 17 | Ht 64.0 in | Wt 184.4 lb

## 2024-08-30 DIAGNOSIS — Z13220 Encounter for screening for lipoid disorders: Secondary | ICD-10-CM | POA: Diagnosis not present

## 2024-08-30 DIAGNOSIS — Z23 Encounter for immunization: Secondary | ICD-10-CM | POA: Diagnosis not present

## 2024-08-30 DIAGNOSIS — E039 Hypothyroidism, unspecified: Secondary | ICD-10-CM | POA: Diagnosis not present

## 2024-08-30 DIAGNOSIS — Z6831 Body mass index (BMI) 31.0-31.9, adult: Secondary | ICD-10-CM

## 2024-08-30 DIAGNOSIS — Z0000 Encounter for general adult medical examination without abnormal findings: Secondary | ICD-10-CM

## 2024-08-30 DIAGNOSIS — Z131 Encounter for screening for diabetes mellitus: Secondary | ICD-10-CM

## 2024-08-30 DIAGNOSIS — E66811 Obesity, class 1: Secondary | ICD-10-CM

## 2024-08-30 DIAGNOSIS — E2749 Other adrenocortical insufficiency: Secondary | ICD-10-CM | POA: Diagnosis not present

## 2024-08-30 NOTE — Progress Notes (Unsigned)
 Subjective:  Patient ID: Elizabeth Lamb, female    DOB: 1955-11-23  Age: 68 y.o. MRN: [id number]  CC:  Chief Complaint  Patient presents with   Annual Exam    No questions or concerns.     HPI [name] presents for Annual Exam  PCP, me Hand/Ortho, Dr. Camella, left thumb pain, [facility] Endocrinology, Dr.Thapa, hypothyroidism, adrenal insufficiency. Synthroid , cortef . Appt 12/29.  Optho, Dr. Glendia Gaudy, left eye cataract Gastroenterology, Dr. Donnald Gynecology, [facility].   Establishing with [facility], also considering [facility]. Quit smoking in September 2024. Weight gain since that time. Has tried more exercise, trying to watch diet, some sweet cravings. Some soreness in hips with higher weight.  No FH of medullary thyroid  CA or MEN syndrome, no personal hx of pancreatitis.  Goal of 40# weight loss.  Body mass index is 31.65 kg/m. Wt Readings from Last 3 Encounters:  08/30/24 184 lb 6.4 oz (83.6 kg)  02/28/24 165 lb 6.4 oz (75 kg)  10/11/23 149 lb 6.4 oz (67.8 kg)  No results found for: HGBA1C  Insomnia: Hydroxyzine  in past. 1/2 of vicodin for hand pain for now. Not needing hydroxyzine , but has if needed.      02/28/2024    2:29 PM 10/11/2023    2:39 PM 09/01/2023    3:54 PM 08/19/2023    4:17 PM 07/14/2023    2:09 PM  Depression screen PHQ 2/9  Decreased Interest 0 0 0 0 0  Down, Depressed, Hopeless 0 0 0 0 0  PHQ - 2 Score 0 0 0 0 0  Altered sleeping  0 0 0   Tired, decreased energy  0 0 0   Change in appetite  0 0 0   Feeling bad or failure about yourself   0 0 0   Trouble concentrating  0 0 0   Moving slowly or fidgety/restless  0 0 0   Suicidal thoughts  0 0 0   PHQ-9 Score  0  0  0    Difficult doing work/chores    Not difficult at all      Data saved with a previous flowsheet row definition    Health Maintenance  Topic Date Due   Zoster Vaccines- Shingrix (1 of 2) 11/12/2004   Bone Density Scan  Never done    Medicare Annual Wellness (AWV)  08/18/2024   COVID-19 Vaccine (1 - 2025-26 season) 09/15/2024 (Originally 05/29/2024)   Pneumococcal Vaccine: 50+ Years (2 of 2 - PCV20 or PCV21) 08/30/2025 (Originally 06/19/2020)   Hepatitis C Screening  08/30/2025 (Originally 11/12/1973)   Mammogram  12/28/2025   DTaP/Tdap/Td (2 - Td or Tdap) 07/21/2033   Colonoscopy  03/09/2034   Influenza Vaccine  Completed   Meningococcal B Vaccine  Aged Out  Colonoscopy 03/09/24 - repeat 10 years.  UTD on pap in May at GYN. Mammogram 4/2.  Derm - Dr. Lynnell - yearly.   Immunization History  Administered Date(s) Administered   INFLUENZA, HIGH DOSE SEASONAL PF 07/17/2020, 08/30/2024   Influenza,inj,Quad PF,6+ Mos 11/09/2014, 06/20/2019   Influenza-Unspecified 07/10/2015, 07/22/2023   PPD Test 01/27/2013   Pneumococcal Conjugate-13 06/20/2019   Tdap 07/22/2023   Zoster, Live 11/09/2014  Covid booster - declines.  Shingrix- recommended at pharmacy.   No results found. Regular care with Dr. Gaudy - prior cataracts.   Hearing aids working well.   Dental: every 6 months.   Alcohol: social - few per month.   Tobacco: quit September 2024.  Exercise: walking 1.5 miles 4 days per week. No current resistance.    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 Authorizing Provider  calcium carbonate (TUMS - DOSED IN MG ELEMENTAL CALCIUM) 500 MG chewable tablet Chew 2 tablets by mouth daily.   Yes [provider]  hydrocortisone  (CORTEF ) 20 MG tablet Take 1 tablet in the morning and 1/2 tab in the afternoon and take additional / double the dose in case of illness. 09/13/23  Yes Thapa, [place], MD  levothyroxine  (SYNTHROID ) 25 MCG tablet TAKE 1 TABLET BY MOUTH DAILY BEFORE BREAKFAST 02/07/24  Yes Thapa, [place], MD  Multiple Vitamins-Minerals (MULTIVITAMIN ADULT) CHEW Chew 1 each by mouth daily.   Yes [provider]  eletriptan  (RELPAX ) 40 MG tablet TAKE 1 TABLET BY MOUTH AT ONSET OF HEADACHE. MAY REPEAT IN 2 HOURS IF HEADACHE PERSISTS OR RECURS Patient not taking: Reported on 08/30/2024 01/04/23   [place], MD  HYDROcodone-acetaminophen  (NORCO/VICODIN) 5-325 MG tablet Take by mouth. Patient not taking: Reported on 08/30/2024 05/27/21   [provider]  hydrOXYzine  (ATARAX ) 10 MG tablet Take 1 tablet (10 mg total) by mouth at bedtime as needed. Patient not taking: Reported on 08/30/2024 07/14/23   Levora Reyes SAUNDERS, MD  naproxen sodium (ANAPROX) 220 MG tablet Take 660 mg by mouth as needed (pain). Patient not taking: Reported on 08/30/2024    [provider]   Social History   Socioeconomic History   Marital status: Married    Spouse name: Not on file   Number of children: Not on file   Years of education: Not on file   Highest education level: Associate degree: academic program  Occupational History   Not on file  Tobacco Use   Smoking status: Former    Types: Cigarettes    Start date: 2024    Quit date: 1978    Years since quitting: 47.9   Smokeless tobacco: Never  Vaping Use    Vaping status: Never Used  Substance and Sexual Activity   Alcohol use: Yes   Drug use: No   Sexual activity: Yes    Birth control/protection: Surgical  Other Topics Concern   Not on file  Social History Narrative   Not on file   Social Drivers of Health   Financial Resource Strain: Low Risk  (02/26/2024)   Overall Financial Resource Strain (CARDIA)    Difficulty of Paying Living Expenses: Not hard at all  Food Insecurity: No Food Insecurity (02/26/2024)   Hunger Vital Sign    Worried About Running Out of Food in the  Last Year: Never true    Ran Out of Food in the Last Year: Never true  Transportation Needs: No Transportation Needs (02/26/2024)   PRAPARE - Administrator, Civil Service (Medical): No    Lack of Transportation (Non-Medical): No  Physical Activity: Insufficiently Active (02/26/2024)   Exercise Vital Sign    Days of Exercise per Week: 1 day    Minutes of Exercise per Session: 20 min  Stress: No Stress Concern Present (02/26/2024)   [employer] of Occupational Health - Occupational Stress Questionnaire    Feeling of Stress : Not at all  Social Connections: Socially Integrated (02/26/2024)   Social Connection and Isolation Panel    Frequency of Communication with Friends and Family: More than three times a week    Frequency of Social Gatherings with Friends and Family: Twice a week    Attends Religious Services: More than 4 times per year    Active Member of [employer] or Organizations: Yes    Attends Banker Meetings: More than 4 times per year    Marital Status: Married  Catering Manager Violence: Not At Risk (08/19/2023)   Humiliation, Afraid, Rape, and Kick questionnaire    Fear of Current or Ex-Partner: No    Emotionally Abused: No    Physically Abused: No    Sexually Abused: No    Review of Systems 13 point review of systems per patient health survey noted.  Negative other than as indicated above or in HPI.    Objective:    Vitals:   08/30/24 1452  BP: 122/60  Pulse: 88  Resp: 17  Temp: 98 F (36.7 C)  TempSrc: Temporal  SpO2: 95%  Weight: 184 lb 6.4 oz (83.6 kg)  Height: 5' 4 (1.626 m)     Physical Exam Constitutional:      Appearance: She is well-developed.  HENT:     Head: Normocephalic and atraumatic.     Right Ear: External ear normal.     Left Ear: External ear normal.  Eyes:     Conjunctiva/sclera: Conjunctivae normal.     Pupils: Pupils are equal, round, and reactive to light.  Neck:     Thyroid : No thyromegaly.  Cardiovascular:     Rate and Rhythm: Normal rate and regular rhythm.     Heart sounds: Normal heart sounds. No murmur heard. Pulmonary:     Effort: Pulmonary effort is normal. No respiratory distress.     Breath sounds: Normal breath sounds. No wheezing.  Abdominal:     General: Bowel sounds are normal.     Palpations: Abdomen is soft.     Tenderness: There is no abdominal tenderness.  Musculoskeletal:        General: No tenderness. Normal range of motion.     Cervical back: Normal range of motion and neck supple.  Lymphadenopathy:     Cervical: No cervical adenopathy.  Skin:    General: Skin is warm and dry.     Findings: No rash.  Neurological:     Mental Status: She is alert and oriented to person, place, and time.  Psychiatric:        Behavior: Behavior normal.        Thought Content: Thought content normal.        Assessment & Plan:  MINDEL FRISCIA is a 68 y.o. female . Annual physical exam  - -anticipatory guidance as below in AVS, screening labs above. Health maintenance items as above in HPI discussed/recommended  as applicable.   Need for influenza vaccination - Plan: Flu vaccine HIGH DOSE PF(Fluzone Trivalent)  Hypothyroidism, unspecified type  - Tolerating current med regimen as above, followed by endocrinology.  Plans to seek care with [facility] or other facility to assist in weight management.  Also will be meeting with integrative health  provider above.  Recommended she discuss any potential medication changes or recommendations with her endocrinologist.  Additionally provided number for healthy weight and wellness.  No new meds for me at this point.  Secondary adrenal insufficiency  - As above follow-up with endocrinologist including if any new medications recommended from other providers.  Screening for diabetes mellitus - Plan: Hemoglobin A1c, Comprehensive metabolic panel with GFR  Screening for hyperlipidemia - Plan: Comprehensive metabolic panel with GFR, Lipid panel  Class 1 obesity with body mass index (BMI) of 31.0 to 31.9 in adult, unspecified obesity type, unspecified whether serious comorbidity present  - Plan to follow-up with above to review treatment options.  No orders of the defined types were placed in this encounter.  Patient Instructions  Certainly can discuss options with [facility], and [facility], but here is another option for weight loss if needed. Would discuss any new meds or treatments with your endocrinologist.   [facility]  [phone]  Thank you for coming in today. No change in medications at this time. If there are any concerns on your bloodwork, I will let you know. Take care!  Preventive Care 41 Years and Older, Female Preventive care refers to lifestyle choices and visits with your health care provider that can promote health and wellness. Preventive care visits are also called wellness exams. What can I expect for my preventive care visit? Counseling Your health care provider may ask you questions about your: Medical history, including: Past medical problems. Family medical history. Pregnancy and menstrual history. History of falls. Current health, including: Memory and ability to understand (cognition). Emotional well-being. Home life and relationship well-being. Sexual activity and sexual health. Lifestyle,  including: Alcohol, nicotine or tobacco, and drug use. Access to firearms. Diet, exercise, and sleep habits. Work and work astronomer. Sunscreen use. Safety issues such as seatbelt and bike helmet use. Physical exam Your health care provider will check your: Height and weight. These may be used to calculate your BMI (body mass index). BMI is a measurement that tells if you are at a healthy weight. Waist circumference. This measures the distance around your waistline. This measurement also tells if you are at a healthy weight and may help predict your risk of certain diseases, such as type 2 diabetes and high blood pressure. Heart rate and blood pressure. Body temperature. Skin for abnormal spots. What immunizations do I need?  Vaccines are usually given at various ages, according to a schedule. Your health care provider will recommend vaccines for you based on your age, medical history, and lifestyle or other factors, such as travel or where you work. What tests do I need? Screening Your health care provider may recommend screening tests for certain conditions. This may include: Lipid and cholesterol levels. Hepatitis C test. Hepatitis B test. HIV (human immunodeficiency virus) test. STI (sexually transmitted infection) testing, if you are at risk. Lung cancer screening. Colorectal cancer screening. Diabetes screening. This is done by checking your blood sugar (glucose) after you have not eaten for a while (fasting). Mammogram. Talk with your health care provider about how often you should have regular mammograms. BRCA-related cancer  screening. This may be done if you have a family history of breast, ovarian, tubal, or peritoneal cancers. Bone density scan. This is done to screen for osteoporosis. Talk with your health care provider about your test results, treatment options, and if necessary, the need for more tests. Follow these instructions at home: Eating and drinking  Eat a  diet that includes fresh fruits and vegetables, whole grains, lean protein, and low-fat dairy products. Limit your intake of foods with high amounts of sugar, saturated fats, and salt. Take vitamin and mineral supplements as recommended by your health care provider. Do not drink alcohol if your health care provider tells you not to drink. If you drink alcohol: Limit how much you have to 0-1 drink a day. Know how much alcohol is in your drink. In the [place] one drink equals one 12 oz bottle of beer (355 mL), one 5 oz glass of wine (148 mL), or one 1 oz glass of hard liquor (44 mL). Lifestyle Brush your teeth every morning and night with fluoride toothpaste. Floss one time each day. Exercise for at least 30 minutes 5 or more days each week. Do not use any products that contain nicotine or tobacco. These products include cigarettes, chewing tobacco, and vaping devices, such as e-cigarettes. If you need help quitting, ask your health care provider. Do not use drugs. If you are sexually active, practice safe sex. Use a condom or other form of protection in order to prevent STIs. Take aspirin only as told by your health care provider. Make sure that you understand how much to take and what form to take. Work with your health care provider to find out whether it is safe and beneficial for you to take aspirin daily. Ask your health care provider if you need to take a cholesterol-lowering medicine (statin). Find healthy ways to manage stress, such as: Meditation, yoga, or listening to music. Journaling. Talking to a trusted person. Spending time with friends and family. Minimize exposure to UV radiation to reduce your risk of skin cancer. Safety Always wear your seat belt while driving or riding in a vehicle. Do not drive: If you have been drinking alcohol. Do not ride with someone who has been drinking. When you are tired or distracted. While texting. If you have been using any mind-altering  substances or drugs. Wear a helmet and other protective equipment during sports activities. If you have firearms in your house, make sure you follow all gun safety procedures. What's next? Visit your health care provider once a year for an annual wellness visit. Ask your health care provider how often you should have your eyes and teeth checked. Stay up to date on all vaccines. This information is not intended to replace advice given to you by your health care provider. Make sure you discuss any questions you have with your health care provider. Document Revised: 03/12/2021 Document Reviewed: 03/12/2021 Elsevier Patient Education  2024 Elsevier Inc.       Signed,   Reyes Pines, MD [facility], [facility] 08/30/24 3:08 PM

## 2024-08-30 NOTE — Patient Instructions (Addendum)
 Certainly can discuss options with [facility], and [facility], but here is another option for weight loss if needed. Would discuss any new meds or treatments with your endocrinologist.   [facility]  [phone]  Thank you for coming in today. No change in medications at this time. If there are any concerns on your bloodwork, I will let you know. Take care!  Preventive Care 51 Years and Older, Female Preventive care refers to lifestyle choices and visits with your health care provider that can promote health and wellness. Preventive care visits are also called wellness exams. What can I expect for my preventive care visit? Counseling Your health care provider may ask you questions about your: Medical history, including: Past medical problems. Family medical history. Pregnancy and menstrual history. History of falls. Current health, including: Memory and ability to understand (cognition). Emotional well-being. Home life and relationship well-being. Sexual activity and sexual health. Lifestyle, including: Alcohol, nicotine or tobacco, and drug use. Access to firearms. Diet, exercise, and sleep habits. Work and work astronomer. Sunscreen use. Safety issues such as seatbelt and bike helmet use. Physical exam Your health care provider will check your: Height and weight. These may be used to calculate your BMI (body mass index). BMI is a measurement that tells if you are at a healthy weight. Waist circumference. This measures the distance around your waistline. This measurement also tells if you are at a healthy weight and may help predict your risk of certain diseases, such as type 2 diabetes and high blood pressure. Heart rate and blood pressure. Body temperature. Skin for abnormal spots. What immunizations do I need?  Vaccines are usually given at various ages, according to a schedule. Your health care provider will  recommend vaccines for you based on your age, medical history, and lifestyle or other factors, such as travel or where you work. What tests do I need? Screening Your health care provider may recommend screening tests for certain conditions. This may include: Lipid and cholesterol levels. Hepatitis C test. Hepatitis B test. HIV (human immunodeficiency virus) test. STI (sexually transmitted infection) testing, if you are at risk. Lung cancer screening. Colorectal cancer screening. Diabetes screening. This is done by checking your blood sugar (glucose) after you have not eaten for a while (fasting). Mammogram. Talk with your health care provider about how often you should have regular mammograms. BRCA-related cancer screening. This may be done if you have a family history of breast, ovarian, tubal, or peritoneal cancers. Bone density scan. This is done to screen for osteoporosis. Talk with your health care provider about your test results, treatment options, and if necessary, the need for more tests. Follow these instructions at home: Eating and drinking  Eat a diet that includes fresh fruits and vegetables, whole grains, lean protein, and low-fat dairy products. Limit your intake of foods with high amounts of sugar, saturated fats, and salt. Take vitamin and mineral supplements as recommended by your health care provider. Do not drink alcohol if your health care provider tells you not to drink. If you drink alcohol: Limit how much you have to 0-1 drink a day. Know how much alcohol is in your drink. In the [place] one drink equals one 12 oz bottle of beer (355 mL), one 5 oz glass of wine (148 mL), or one 1 oz glass of hard liquor (44 mL). Lifestyle Brush your teeth every morning and night with fluoride toothpaste. Floss one time each day. Exercise for  at least 30 minutes 5 or more days each week. Do not use any products that contain nicotine or tobacco. These products include cigarettes,  chewing tobacco, and vaping devices, such as e-cigarettes. If you need help quitting, ask your health care provider. Do not use drugs. If you are sexually active, practice safe sex. Use a condom or other form of protection in order to prevent STIs. Take aspirin only as told by your health care provider. Make sure that you understand how much to take and what form to take. Work with your health care provider to find out whether it is safe and beneficial for you to take aspirin daily. Ask your health care provider if you need to take a cholesterol-lowering medicine (statin). Find healthy ways to manage stress, such as: Meditation, yoga, or listening to music. Journaling. Talking to a trusted person. Spending time with friends and family. Minimize exposure to UV radiation to reduce your risk of skin cancer. Safety Always wear your seat belt while driving or riding in a vehicle. Do not drive: If you have been drinking alcohol. Do not ride with someone who has been drinking. When you are tired or distracted. While texting. If you have been using any mind-altering substances or drugs. Wear a helmet and other protective equipment during sports activities. If you have firearms in your house, make sure you follow all gun safety procedures. What's next? Visit your health care provider once a year for an annual wellness visit. Ask your health care provider how often you should have your eyes and teeth checked. Stay up to date on all vaccines. This information is not intended to replace advice given to you by your health care provider. Make sure you discuss any questions you have with your health care provider. Document Revised: 03/12/2021 Document Reviewed: 03/12/2021 Elsevier Patient Education  2024 [employer].

## 2024-08-31 LAB — LIPID PANEL
Cholesterol: 221 mg/dL — ABNORMAL HIGH (ref 0–200)
HDL: 66.8 mg/dL (ref >39.00)
LDL Cholesterol: 106 mg/dL — ABNORMAL HIGH (ref 0–99)
NonHDL: 154.49
Total CHOL/HDL Ratio: 3
Triglycerides: 244 mg/dL — ABNORMAL HIGH (ref 0.0–149.0)
VLDL: 48.8 mg/dL — ABNORMAL HIGH (ref 0.0–40.0)

## 2024-08-31 LAB — COMPREHENSIVE METABOLIC PANEL WITH GFR
ALT: 16 U/L (ref 0–35)
AST: 23 U/L (ref 0–37)
Albumin: 4.3 g/dL (ref 3.5–5.2)
Alkaline Phosphatase: 60 U/L (ref 39–117)
BUN: 19 mg/dL (ref 6–23)
CO2: 29 meq/L (ref 19–32)
Calcium: 9.5 mg/dL (ref 8.4–10.5)
Chloride: 104 meq/L (ref 96–112)
Creatinine, Ser: 0.67 mg/dL (ref 0.40–1.20)
GFR: 88.94 mL/min (ref >60.00)
Glucose, Bld: 116 mg/dL — ABNORMAL HIGH (ref 70–99)
Potassium: 4.1 meq/L (ref 3.5–5.1)
Sodium: 140 meq/L (ref 135–145)
Total Bilirubin: 0.3 mg/dL (ref 0.2–1.2)
Total Protein: 6.4 g/dL (ref 6.0–8.3)

## 2024-08-31 LAB — HEMOGLOBIN A1C: Hgb A1c MFr Bld: 5.5 % (ref 4.6–6.5)

## 2024-09-03 ENCOUNTER — Ambulatory Visit: Payer: Self-pay | Admitting: Family Medicine

## 2024-09-11 ENCOUNTER — Other Ambulatory Visit

## 2024-09-11 DIAGNOSIS — E038 Other specified hypothyroidism: Secondary | ICD-10-CM | POA: Diagnosis not present

## 2024-09-11 DIAGNOSIS — E2749 Other adrenocortical insufficiency: Secondary | ICD-10-CM | POA: Diagnosis not present

## 2024-09-12 ENCOUNTER — Ambulatory Visit: Payer: Self-pay | Admitting: Endocrinology

## 2024-09-12 ENCOUNTER — Other Ambulatory Visit: Payer: Medicare HMO

## 2024-09-12 LAB — BASIC METABOLIC PANEL WITH GFR
BUN: 16 mg/dL (ref 7–25)
CO2: 31 mmol/L (ref 20–32)
Calcium: 10 mg/dL (ref 8.6–10.4)
Chloride: 101 mmol/L (ref 98–110)
Creat: 0.75 mg/dL (ref 0.50–1.05)
Glucose, Bld: 96 mg/dL (ref 65–99)
Potassium: 4.6 mmol/L (ref 3.5–5.3)
Sodium: 140 mmol/L (ref 135–146)
eGFR: 86 mL/min/1.73m2 (ref >=60)

## 2024-09-12 LAB — TSH: TSH: 1.62 m[IU]/L (ref 0.40–4.50)

## 2024-09-12 LAB — T4, FREE: Free T4: 1.3 ng/dL (ref 0.8–1.8)

## 2024-09-15 ENCOUNTER — Ambulatory Visit: Payer: Medicare HMO | Admitting: Endocrinology

## 2024-09-17 ENCOUNTER — Emergency Department (HOSPITAL_COMMUNITY)

## 2024-09-17 ENCOUNTER — Encounter (HOSPITAL_COMMUNITY): Payer: Self-pay | Admitting: *Deleted

## 2024-09-17 ENCOUNTER — Inpatient Hospital Stay (HOSPITAL_COMMUNITY)
Admission: EM | Admit: 2024-09-17 | Discharge: 2024-09-20 | Disposition: A | Discharge status: Home / Self Care | Attending: Student | Admitting: Student

## 2024-09-17 ENCOUNTER — Other Ambulatory Visit: Payer: Self-pay

## 2024-09-17 DIAGNOSIS — Z8249 Family history of ischemic heart disease and other diseases of the circulatory system: Secondary | ICD-10-CM

## 2024-09-17 DIAGNOSIS — E039 Hypothyroidism, unspecified: Secondary | ICD-10-CM | POA: Diagnosis present

## 2024-09-17 DIAGNOSIS — Z833 Family history of diabetes mellitus: Secondary | ICD-10-CM

## 2024-09-17 DIAGNOSIS — G9340 Encephalopathy, unspecified: Secondary | ICD-10-CM | POA: Diagnosis not present

## 2024-09-17 DIAGNOSIS — R110 Nausea: Secondary | ICD-10-CM | POA: Diagnosis not present

## 2024-09-17 DIAGNOSIS — Z90710 Acquired absence of both cervix and uterus: Secondary | ICD-10-CM

## 2024-09-17 DIAGNOSIS — E8721 Acute metabolic acidosis: Secondary | ICD-10-CM | POA: Diagnosis present

## 2024-09-17 DIAGNOSIS — G9341 Metabolic encephalopathy: Principal | ICD-10-CM | POA: Diagnosis present

## 2024-09-17 DIAGNOSIS — Z79899 Other long term (current) drug therapy: Secondary | ICD-10-CM

## 2024-09-17 DIAGNOSIS — Z87891 Personal history of nicotine dependence: Secondary | ICD-10-CM

## 2024-09-17 DIAGNOSIS — E2749 Other adrenocortical insufficiency: Secondary | ICD-10-CM | POA: Diagnosis present

## 2024-09-17 DIAGNOSIS — E876 Hypokalemia: Secondary | ICD-10-CM | POA: Diagnosis not present

## 2024-09-17 DIAGNOSIS — R918 Other nonspecific abnormal finding of lung field: Secondary | ICD-10-CM | POA: Diagnosis present

## 2024-09-17 DIAGNOSIS — Z881 Allergy status to other antibiotic agents status: Secondary | ICD-10-CM

## 2024-09-17 DIAGNOSIS — Z8349 Family history of other endocrine, nutritional and metabolic diseases: Secondary | ICD-10-CM

## 2024-09-17 DIAGNOSIS — E66811 Obesity, class 1: Secondary | ICD-10-CM | POA: Diagnosis present

## 2024-09-17 DIAGNOSIS — R509 Fever, unspecified: Secondary | ICD-10-CM | POA: Diagnosis not present

## 2024-09-17 DIAGNOSIS — R6510 Systemic inflammatory response syndrome (SIRS) of non-infectious origin without acute organ dysfunction: Secondary | ICD-10-CM

## 2024-09-17 DIAGNOSIS — R1110 Vomiting, unspecified: Secondary | ICD-10-CM

## 2024-09-17 DIAGNOSIS — E860 Dehydration: Principal | ICD-10-CM | POA: Diagnosis present

## 2024-09-17 DIAGNOSIS — R9431 Abnormal electrocardiogram [ECG] [EKG]: Secondary | ICD-10-CM | POA: Diagnosis present

## 2024-09-17 DIAGNOSIS — N179 Acute kidney failure, unspecified: Secondary | ICD-10-CM | POA: Diagnosis present

## 2024-09-17 DIAGNOSIS — M6282 Rhabdomyolysis: Secondary | ICD-10-CM | POA: Diagnosis present

## 2024-09-17 DIAGNOSIS — B001 Herpesviral vesicular dermatitis: Secondary | ICD-10-CM | POA: Diagnosis present

## 2024-09-17 DIAGNOSIS — I959 Hypotension, unspecified: Secondary | ICD-10-CM | POA: Diagnosis not present

## 2024-09-17 DIAGNOSIS — Z6831 Body mass index (BMI) 31.0-31.9, adult: Secondary | ICD-10-CM

## 2024-09-17 DIAGNOSIS — R112 Nausea with vomiting, unspecified: Secondary | ICD-10-CM

## 2024-09-17 DIAGNOSIS — R197 Diarrhea, unspecified: Secondary | ICD-10-CM

## 2024-09-17 DIAGNOSIS — Z1152 Encounter for screening for COVID-19: Secondary | ICD-10-CM

## 2024-09-17 DIAGNOSIS — Z79890 Hormone replacement therapy (postmenopausal): Secondary | ICD-10-CM

## 2024-09-17 DIAGNOSIS — A0811 Acute gastroenteropathy due to Norwalk agent: Secondary | ICD-10-CM | POA: Diagnosis present

## 2024-09-17 DIAGNOSIS — R21 Rash and other nonspecific skin eruption: Secondary | ICD-10-CM | POA: Diagnosis present

## 2024-09-17 DIAGNOSIS — R0902 Hypoxemia: Secondary | ICD-10-CM | POA: Diagnosis not present

## 2024-09-17 DIAGNOSIS — Z7952 Long term (current) use of systemic steroids: Secondary | ICD-10-CM

## 2024-09-17 LAB — PROTIME-INR
INR: 1.1 (ref 0.8–1.2)
Prothrombin Time: 15 s (ref 11.4–15.2)

## 2024-09-17 LAB — COMPREHENSIVE METABOLIC PANEL WITH GFR
ALT: 40 U/L (ref 0–44)
AST: 85 U/L — ABNORMAL HIGH (ref 15–41)
Albumin: 3.7 g/dL (ref 3.5–5.0)
Alkaline Phosphatase: 51 U/L (ref 38–126)
Anion gap: 12 (ref 5–15)
BUN: 31 mg/dL — ABNORMAL HIGH (ref 8–23)
CO2: 20 mmol/L — ABNORMAL LOW (ref 22–32)
Calcium: 8.5 mg/dL — ABNORMAL LOW (ref 8.9–10.3)
Chloride: 110 mmol/L (ref 98–111)
Creatinine, Ser: 1.54 mg/dL — ABNORMAL HIGH (ref 0.44–1.00)
GFR, Estimated: 36 mL/min — ABNORMAL LOW
Glucose, Bld: 89 mg/dL (ref 70–99)
Potassium: 3.5 mmol/L (ref 3.5–5.1)
Sodium: 142 mmol/L (ref 135–145)
Total Bilirubin: 0.5 mg/dL (ref 0.0–1.2)
Total Protein: 6 g/dL — ABNORMAL LOW (ref 6.5–8.1)

## 2024-09-17 LAB — CBC WITH DIFFERENTIAL/PLATELET
Basophils Absolute: 0.6 K/uL — ABNORMAL HIGH (ref 0.0–0.1)
Basophils Relative: 4 %
Eosinophils Absolute: 0 K/uL (ref 0.0–0.5)
Eosinophils Relative: 0 %
HCT: 40.4 % (ref 36.0–46.0)
Hemoglobin: 13.4 g/dL (ref 12.0–15.0)
Lymphocytes Relative: 40 %
Lymphs Abs: 5.8 K/uL — ABNORMAL HIGH (ref 0.7–4.0)
MCH: 29 pg (ref 26.0–34.0)
MCHC: 33.2 g/dL (ref 30.0–36.0)
MCV: 87.4 fL (ref 80.0–100.0)
Monocytes Absolute: 0.3 K/uL (ref 0.1–1.0)
Monocytes Relative: 2 %
Neutro Abs: 7.9 K/uL — ABNORMAL HIGH (ref 1.7–7.7)
Neutrophils Relative %: 54 %
Platelets: 165 K/uL (ref 150–400)
RBC: 4.62 MIL/uL (ref 3.87–5.11)
RDW: 13.3 % (ref 11.5–15.5)
Smear Review: NORMAL
WBC: 14.6 K/uL — ABNORMAL HIGH (ref 4.0–10.5)
nRBC: 0 % (ref 0.0–0.2)

## 2024-09-17 LAB — URINALYSIS, ROUTINE W REFLEX MICROSCOPIC
Bacteria, UA: NONE SEEN
Bilirubin Urine: NEGATIVE
Glucose, UA: NEGATIVE mg/dL
Ketones, ur: NEGATIVE mg/dL
Nitrite: NEGATIVE
Protein, ur: 100 mg/dL — AB
Specific Gravity, Urine: 1.017 (ref 1.005–1.030)
pH: 5 (ref 5.0–8.0)

## 2024-09-17 LAB — LIPASE, BLOOD: Lipase: 47 U/L (ref 11–51)

## 2024-09-17 LAB — RESP PANEL BY RT-PCR (RSV, FLU A&B, COVID)  RVPGX2
Influenza A by PCR: NEGATIVE
Influenza B by PCR: NEGATIVE
Resp Syncytial Virus by PCR: NEGATIVE
SARS Coronavirus 2 by RT PCR: NEGATIVE

## 2024-09-17 LAB — AMMONIA: Ammonia: 30 umol/L (ref 9–35)

## 2024-09-17 LAB — MAGNESIUM: Magnesium: 1.6 mg/dL — ABNORMAL LOW (ref 1.7–2.4)

## 2024-09-17 LAB — PROCALCITONIN: Procalcitonin: 1.67 ng/mL

## 2024-09-17 LAB — I-STAT CG4 LACTIC ACID, ED
Lactic Acid, Venous: 2 mmol/L (ref 0.5–1.9)
Lactic Acid, Venous: 1.7 mmol/L (ref 0.5–1.9)

## 2024-09-17 LAB — VITAMIN B12: Vitamin B-12: 499 pg/mL (ref 180–914)

## 2024-09-17 MED ORDER — LORAZEPAM 2 MG/ML IJ SOLN
0.5000 mg | Freq: Once | INTRAMUSCULAR | Status: AC
  Administered 2024-09-17: 0.5 mg via INTRAVENOUS
  Filled 2024-09-17: qty 1

## 2024-09-17 MED ORDER — HYDROCORTISONE SOD SUC (PF) 100 MG IJ SOLR
100.0000 mg | Freq: Once | INTRAMUSCULAR | Status: AC
  Administered 2024-09-17: 100 mg via INTRAVENOUS
  Filled 2024-09-17: qty 2

## 2024-09-17 MED ORDER — ACETAMINOPHEN 650 MG RE SUPP
650.0000 mg | Freq: Four times a day (QID) | RECTAL | Status: DC | PRN
  Administered 2024-09-18: 650 mg via RECTAL
  Filled 2024-09-17: qty 1

## 2024-09-17 MED ORDER — IOHEXOL 300 MG/ML  SOLN
100.0000 mL | Freq: Once | INTRAMUSCULAR | Status: AC | PRN
  Administered 2024-09-17: 100 mL via INTRAVENOUS

## 2024-09-17 MED ORDER — SODIUM CHLORIDE 0.9 % IV SOLN
2.0000 g | Freq: Two times a day (BID) | INTRAVENOUS | Status: DC
  Administered 2024-09-18 – 2024-09-19 (×4): 2 g via INTRAVENOUS
  Filled 2024-09-17 (×4): qty 12.5

## 2024-09-17 MED ORDER — MAGNESIUM SULFATE 2 GM/50ML IV SOLN
2.0000 g | Freq: Once | INTRAVENOUS | Status: AC
  Administered 2024-09-17: 2 g via INTRAVENOUS
  Filled 2024-09-17: qty 50

## 2024-09-17 MED ORDER — METRONIDAZOLE 500 MG/100ML IV SOLN
500.0000 mg | Freq: Two times a day (BID) | INTRAVENOUS | Status: DC
  Administered 2024-09-18 – 2024-09-19 (×4): 500 mg via INTRAVENOUS
  Filled 2024-09-17 (×4): qty 100

## 2024-09-17 MED ORDER — LACTATED RINGERS IV BOLUS
1000.0000 mL | Freq: Once | INTRAVENOUS | Status: AC
  Administered 2024-09-17: 1000 mL via INTRAVENOUS

## 2024-09-17 MED ORDER — SODIUM CHLORIDE 0.9 % IV BOLUS: 500.0000 mL | Freq: Once | INTRAVENOUS | Status: DC

## 2024-09-17 MED ORDER — ACETAMINOPHEN 325 MG PO TABS
650.0000 mg | ORAL_TABLET | Freq: Four times a day (QID) | ORAL | Status: DC | PRN
  Filled 2024-09-17: qty 2

## 2024-09-17 MED ORDER — SODIUM CHLORIDE 0.9 % IV SOLN: INTRAVENOUS | Status: DC

## 2024-09-17 MED ORDER — ONDANSETRON HCL 4 MG/2ML IJ SOLN
4.0000 mg | Freq: Once | INTRAMUSCULAR | Status: AC
  Administered 2024-09-17: 4 mg via INTRAVENOUS
  Filled 2024-09-17: qty 2

## 2024-09-17 MED ORDER — HYDROCORTISONE SOD SUC (PF) 100 MG IJ SOLR
50.0000 mg | Freq: Once | INTRAMUSCULAR | Status: AC
  Administered 2024-09-18: 50 mg via INTRAVENOUS
  Filled 2024-09-17: qty 1

## 2024-09-17 NOTE — Assessment & Plan Note (Signed)
Replete and trend 

## 2024-09-17 NOTE — Assessment & Plan Note (Addendum)
 Incidental finding on CT scan  Smoking history, would repeat non contrast chest CT in 12 months

## 2024-09-17 NOTE — Assessment & Plan Note (Addendum)
 Meets criteria with leukocytosis and tachypnea No source identified. UA with moderate le/20-50wbc, CT chest/abdomen/pelvis with no abnormality. Does have viral GI illness.  BC obtained, add urine culture  Leukocytosis could also be from chronic steroid use  Trend PCT, continue IVF  PCT elevated: start abx

## 2024-09-17 NOTE — ED Notes (Signed)
 Full bed change done due to being incont of stool. Not enough for a sample

## 2024-09-17 NOTE — Assessment & Plan Note (Addendum)
-  followed by endocrinology  --Secondary adrenal insufficiency longstanding.  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  No other pituitary hormone deficiency. -she has not had her hydrocortisone  in 2 days, could be contributing factor to her hypotension/confusion  -stress dose IV now then give 50mg  in 6 hours  -home dose: hydrocortisone  20 mg in the morning and 10 mg in the afternoon

## 2024-09-17 NOTE — Assessment & Plan Note (Addendum)
 Likely pre renal in setting of exogenous loss from diarrhea and vomiting  Had some hypotension in ED as well  UA with 0-5 RBC Continue IVF Avoid nephrotoxic drugs Strict I/O  Trend

## 2024-09-17 NOTE — ED Triage Notes (Signed)
 Pt bib ems from home with reports of NVD X2 days. Pt also has some confusion, Alert and oriented X2. Pt with hx pituitary issues which can cause low sodium. Family states she gets confused when her sodium is low. EMS reports red splotchy rash to leg. Initially hypotensive 80/50. Given 1200cc NS with ems.

## 2024-09-17 NOTE — Assessment & Plan Note (Signed)
 Sounds like norovirus has gone through family  Stool studies pending Enteric precautions Continue IVF

## 2024-09-17 NOTE — ED Notes (Signed)
 Family at bedside.

## 2024-09-17 NOTE — Assessment & Plan Note (Signed)
 Replete magnesium  Optimize electrolytes Keep on telemetry Avoid qt prolonging drugs  Repeat ekg in AM

## 2024-09-17 NOTE — Assessment & Plan Note (Addendum)
 68 year old presenting to ED with 2 day history of vomiting and diarrhea found on ground this morning beside her bed in her feces with worsening confusion and hypotension  -obs to progressive -work up uneventful for bacterial cause, likely has viral gastroenteritis. PCT pending, if elevated will start abx  -blood pressure stable after 1L IVF bolus  -stool studies pending  -CT head with no acute findings  -check metabolic labs -check CK  -has missed 2 days of her hydrocortisone  and could also be contributing. Give stress IV dose of hydrocortisone  now  -continue IVF -no seizure like symptoms -consider neuro consult/MRI if no improvement overnight  -NPO -fall precautions

## 2024-09-17 NOTE — Assessment & Plan Note (Signed)
 TSH wnl 08/2024 Continue synthroid  25mcg daily

## 2024-09-17 NOTE — H&P (Addendum)
 " History and Physical    Patient: Elizabeth Lamb [name] DOB: December 12, 1955 DOA: 09/17/2024 DOS: the patient was seen and examined on 09/17/2024 PCP: Levora Reyes SAUNDERS, MD  Patient coming from: Home - lives with her husband. Ambulates independently.    Chief Complaint: lethargy/confusion with N/V/D.   HPI: Elizabeth Lamb is a 68 y.o. female with medical history significant of hypothyroidism, secondary adrenal insufficiency, partial empty sella but no other pituitary hormone deficiencies who presented to ED with nausea/vomiting and diarrhea x 1 week. She denies any fever. Family reports she has had some lethargy and confusion as well. She also has a rash.  Daughter gives history. Her grandbaby had diarrhea and vomiting last Monday and then anyone who had contact with her has had similar symptoms. She started to have symptoms yesterday morning. She started to have vomiting. Her husband called his daughter this morning and said he needed help.  She was laying in the floor in her diarrhea and couldn't get in bed because she was so weak. Unknown how long on floor, but was checked on around 1AM and was okay. Daughter thinks she has had copious amounts of diarrhea. She has had confusion since this morning as well. She also had a rash that was noticed this afternoon. It is just erythematous and blanchable. No fever at home that they are aware of. Daughter took her blood pressure and it was 88/50 x 2 checks which prompted them to call EMS. No neuro deficits, no unilateral weakness, no seizure like activity.   Last known well/at baseline around 1AM.   She does not smoke (quit about 1 year ago). She does not drink.   ER Course:  vitals: afebrile, bp: 163/138, HR: 86, RR: 22, oxygen: 100%RA Pertinent labs: wbc: 14.6, BUN: 31, creatinine: 1.54, AST: 85, magnesium : 1.6,  Lactic acid: 2.0>1.7 CT head: no acute finding CT chest/abdomen/pelvis: scattered small subpleural nodules. No follow up needed if  low risk. No other abnormality In ED: given 1L IVF bolus, BC obtained, stool studies ordered. TRH asked to admit.    Review of Systems: can not be obtained by patient due to inability to answer questions.  Past Medical History:  Diagnosis Date   Allergy    Hypoadrenalism    Pituitary abnormality    Past Surgical History:  Procedure Laterality Date   ABDOMINAL HYSTERECTOMY     Partial   CHOLECYSTECTOMY     rotator repair     TONSILLECTOMY     TUBAL LIGATION     Social History:  reports that she quit smoking about 48 years ago. Her smoking use included cigarettes. She started smoking about 1 years ago. She has never used smokeless tobacco. She reports current alcohol use. She reports that she does not use drugs.  Allergies[1]  Family History  Problem Relation Age of Onset   Congestive Heart Failure Mother    Thyroid  disease Mother    Hypertension Mother    Cancer Father        pancreatic   Hypertension Father    Diabetes Maternal Grandmother     Prior to Admission medications  Medication Sig Start Date End Date Taking? Authorizing Provider  calcium carbonate (TUMS - DOSED IN MG ELEMENTAL CALCIUM) 500 MG chewable tablet Chew 2 tablets by mouth daily.    [provider]  eletriptan  (RELPAX ) 40 MG tablet TAKE 1 TABLET BY MOUTH AT ONSET OF HEADACHE. MAY REPEAT IN 2 HOURS IF HEADACHE PERSISTS OR RECURS Patient not taking:  Reported on 08/30/2024 01/04/23   Von [place], MD  HYDROcodone-acetaminophen  (NORCO/VICODIN) 5-325 MG tablet Take by mouth. Patient not taking: Reported on 08/30/2024 05/27/21   [provider]  hydrocortisone  (CORTEF ) 20 MG tablet Take 1 tablet in the morning and 1/2 tab in the afternoon and take additional / double the dose in case of illness. 09/13/23   Thapa, [place], MD  hydrOXYzine  (ATARAX ) 10 MG tablet Take 1 tablet (10 mg total) by mouth at bedtime as needed. Patient not taking: Reported on 08/30/2024 07/14/23   Levora Reyes SAUNDERS, MD   levothyroxine  (SYNTHROID ) 25 MCG tablet TAKE 1 TABLET BY MOUTH DAILY BEFORE BREAKFAST 02/07/24   Thapa, [place], MD  Multiple Vitamins-Minerals (MULTIVITAMIN ADULT) CHEW Chew 1 each by mouth daily.    [provider]  naproxen sodium (ANAPROX) 220 MG tablet Take 660 mg by mouth as needed (pain). Patient not taking: Reported on 08/30/2024    [provider]    Physical Exam: Vitals:   09/17/24 1930 09/17/24 2137 09/17/24 2154 09/17/24 2201  BP: (!) 111/91  118/80   Pulse: 99  (!) 106   Resp: (!) 25     Temp:   99.2 F (37.3 C)   TempSrc:   Oral   SpO2: 98% 97% (!) 84% 97%  Weight:      Height:       General:  Appears calm and comfortable and is in NAD. Fidgeting  Eyes:  PERRL, EOMI, normal lids, iris ENT:  grossly normal hearing, lips & tongue, dry  mucous membranes; appropriate dentition Neck:  no LAD, masses or thyromegaly; no carotid bruits Cardiovascular:  RRR, no m/r/g. No LE edema.  Respiratory:   CTA bilaterally with no wheezes/rales/rhonchi.  Normal respiratory effort. Abdomen:  soft, NT, ND, NABS Back:   normal alignment, no CVAT Skin:  no induration seen on limited exam. Blanchable erythema of skin on forehead and chin/jaw area.  Musculoskeletal:  grossly normal tone BUE/BLE, good ROM, no bony abnormality. Moves all 4 extremities spontaneously Lower extremity:  No LE edema.  Limited foot exam with no ulcerations.  2+ distal pulses. Psychiatric:  appears confused and agitated. Answers one word.  Neurologic:  CN 2-12 grossly intact, moves all extremities spontaneously.   Radiological Exams on Admission: Independently reviewed - see discussion in A/P where applicable  CT Head Wo Contrast Result Date: 09/17/2024 CLINICAL DATA:  Altered mental status and nausea and vomiting EXAM: CT HEAD WITHOUT CONTRAST TECHNIQUE: Contiguous axial images were obtained from the base of the skull through the vertex without intravenous contrast. RADIATION DOSE REDUCTION: This  exam was performed according to the departmental dose-optimization program which includes automated exposure control, adjustment of the mA and/or kV according to patient size and/or use of iterative reconstruction technique. COMPARISON:  07/29/2008 FINDINGS: Brain: No evidence of acute infarction, hemorrhage, hydrocephalus, extra-axial collection or mass lesion/mass effect. Vascular: No hyperdense vessel or unexpected calcification. Skull: Normal. Negative for fracture or focal lesion. Sinuses/Orbits: No acute finding. Other: None. IMPRESSION: No acute intracranial abnormality noted. Electronically Signed   By: Oneil Devonshire M.D.   On: 09/17/2024 19:27   CT CHEST ABDOMEN PELVIS W CONTRAST Result Date: 09/17/2024 CLINICAL DATA:  Clinical history is possible sepsis, nausea and vomiting for 2 days. EXAM: CT CHEST, ABDOMEN, AND PELVIS WITH CONTRAST TECHNIQUE: Multidetector CT imaging of the chest, abdomen and pelvis was performed following the standard protocol during bolus administration of intravenous contrast. RADIATION DOSE REDUCTION: This exam was performed according to the departmental dose-optimization  program which includes automated exposure control, adjustment of the mA and/or kV according to patient size and/or use of iterative reconstruction technique. CONTRAST:  OMNIPAQUE  IOHEXOL  300 MG/ML  SOLN COMPARISON:  05/07/2008 FINDINGS: CT CHEST FINDINGS Cardiovascular: Atherosclerotic calcifications of the thoracic aorta are noted. No cardiac enlargement is seen. No aneurysmal dilatation or dissection of the aorta is noted. The pulmonary artery shows no large central pulmonary embolus. Timing was not performed for embolus evaluation. Mediastinum/Nodes: Thoracic inlet is within normal limits. The esophagus is unremarkable. No hilar or mediastinal adenopathy is noted. Lungs/Pleura: Biapical pleuroparenchymal scarring is noted. Lungs are well aerated bilaterally. Some subpleural nodularity is noted in the  right upper lobe best seen on image number 127 of series 6. No sizable parenchymal nodules are noted. No pleural effusion is seen. Pneumothorax is noted. Musculoskeletal: Mild degenerative changes of the thoracic spine are seen. CT ABDOMEN PELVIS FINDINGS Hepatobiliary: Gallbladder has been surgically removed. Liver is within normal limits. Pancreas: Unremarkable. No pancreatic ductal dilatation or surrounding inflammatory changes. Spleen: Normal in size without focal abnormality. Adrenals/Urinary Tract: Adrenal glands are within normal limits. Kidneys demonstrate a normal enhancement pattern bilaterally. Normal excretion is seen bilaterally. No renal calculi are noted. No obstructive changes are seen. The bladder is decompressed. Stomach/Bowel: The appendix is within normal limits. No obstructive or inflammatory changes of the colon are seen. The stomach and small bowel are within normal limits. Vascular/Lymphatic: Aortic atherosclerosis. No enlarged abdominal or pelvic lymph nodes. Reproductive: Status post hysterectomy. No adnexal masses. Other: No abdominal wall hernia or abnormality. No abdominopelvic ascites. Musculoskeletal: No acute or significant osseous findings. IMPRESSION: CT of the chest: Scattered small subpleural nodules. No follow-up needed if patient is low-risk (and has no known or suspected primary neoplasm). Non-contrast chest CT can be considered in 12 months if patient is high-risk. This recommendation follows the consensus statement: Guidelines for Management of Incidental Pulmonary Nodules Detected on CT Images: From the Fleischner Society 2017; Radiology 2017; 284:228-243. No other focal abnormality is noted. CT of the abdomen and pelvis: No acute abnormality noted. Electronically Signed   By: Oneil Devonshire M.D.   On: 09/17/2024 19:23    EKG: Independently reviewed.  NSR with rate 86; nonspecific ST changes with no evidence of acute ischemia. Prolonged QT    Labs on Admission: I have  personally reviewed the available labs and imaging studies at the time of the admission.  Pertinent labs:   wbc: 14.6,  BUN: 31,  creatinine: 1.54,  AST: 85,  magnesium : 1.6,  Lactic acid: 2.0>1.7  Assessment and Plan: Principal Problem:   Acute encephalopathy Active Problems:   AKI (acute kidney injury)   Vomiting and diarrhea, likey viral gastroenteritis   Prolonged QT interval   SIRS (systemic inflammatory response syndrome) (HCC)   Hypomagnesemia   Secondary adrenal insufficiency   Pulmonary nodules   Hypothyroidism   Rash    Assessment and Plan: * Acute encephalopathy 68 year old presenting to ED with 2 day history of vomiting and diarrhea found on ground this morning beside her bed in her feces with worsening confusion and hypotension  -obs to progressive -work up uneventful for bacterial cause, likely has viral gastroenteritis. PCT pending, if elevated will start abx  -blood pressure stable after 1L IVF bolus  -stool studies pending  -CT head with no acute findings  -check metabolic labs -check CK  -has missed 2 days of her hydrocortisone  and could also be contributing. Give stress IV dose of hydrocortisone  now  -  continue IVF -no seizure like symptoms -consider neuro consult/MRI if no improvement overnight  -NPO -fall precautions   Vomiting and diarrhea, likey viral gastroenteritis Sounds like norovirus has gone through family  Stool studies pending Enteric precautions Continue IVF   AKI (acute kidney injury) Likely pre renal in setting of exogenous loss from diarrhea and vomiting  Had some hypotension in ED as well  UA with 0-5 RBC Continue IVF Avoid nephrotoxic drugs Strict I/O  Trend   Prolonged QT interval Replete magnesium  Optimize electrolytes Keep on telemetry Avoid qt prolonging drugs  Repeat ekg in AM    Hypomagnesemia Replete and trend   SIRS (systemic inflammatory response syndrome) (HCC) Meets criteria with leukocytosis and  tachypnea No source identified. UA with moderate le/20-50wbc, CT chest/abdomen/pelvis with no abnormality. Does have viral GI illness.  BC obtained, add urine culture  Leukocytosis could also be from chronic steroid use  Trend PCT, continue IVF  PCT elevated: start abx   Secondary adrenal insufficiency -followed by endocrinology  --Secondary adrenal insufficiency longstanding.  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  No other pituitary hormone deficiency. -she has not had her hydrocortisone  in 2 days, could be contributing factor to her hypotension/confusion  -stress dose IV now then give 50mg  in 6 hours  -home dose: hydrocortisone  20 mg in the morning and 10 mg in the afternoon    Pulmonary nodules Incidental finding on CT scan  Smoking history, would repeat non contrast chest CT in 12 months   Hypothyroidism TSH wnl 08/2024 Continue synthroid  25mcg daily   Rash Has blanchable erythematous color to forehead/chin/mandible Does not look like viral exanthem  Area on her shoulder as well Was on ground overnight No new meds  Check ANA       Advance Care Planning:   Code Status: Full Code    Consultants: None    Procedures: CT head 12/21 CT chest/abdomen/pelvis: 12/21    Antibiotics: None     DVT Prophylaxis: SCDs  Family Communication: daughter at bedside   Severity of Illness: The appropriate patient status for this patient is OBSERVATION. Observation status is judged to be reasonable and necessary in order to provide the required intensity of service to ensure the patient's safety. The patient's presenting symptoms, physical exam findings, and initial radiographic and laboratory data in the context of their medical condition is felt to place them at decreased risk for further clinical deterioration. Furthermore, it is anticipated that the patient will be medically stable for discharge from the hospital within 2 midnights of admission.    Author: Isaiah Geralds, MD 09/17/2024 11:16 PM  For on call review [email or web address].      [1]  Allergies Allergen Reactions   Vibramycin [Doxycycline Calcium] Rash   "

## 2024-09-17 NOTE — Assessment & Plan Note (Signed)
 Has blanchable erythematous color to forehead/chin/mandible Does not look like viral exanthem  Area on her shoulder as well Was on ground overnight No new meds  Check ANA

## 2024-09-17 NOTE — ED Provider Notes (Signed)
 " [facility] AT [facility] Provider Note   CSN: [id number] Arrival date & time: 09/17/24  1640     Patient presents with: No chief complaint on file.   Elizabeth Lamb is a 68 y.o. female.  Level 5 caveat secondary to confusion.  She is brought in by ambulance from home.  Husband is assisting with history.  She has been having nausea vomiting for about a week, diarrhea for the last couple of days.  Multiple sick contacts in family.  No fever.  Has been more confused over the last few days.  History of same, history of pituitary tumors and low sodium.  Also has new rash on left leg.  No chest pain or cough no headache no abdominal pain.  EMS gave 1200 cc of fluid for low blood pressure.  Blood pressure remains low here.  {Add pertinent medical, surgical, social history, OB history to YEP:67052} The history is provided by the patient, the spouse and the EMS personnel.  Emesis Severity:  Moderate Duration:  1 week Timing:  Intermittent Progression:  Unchanged Chronicity:  New Associated symptoms: diarrhea   Associated symptoms: no abdominal pain, no chills, no cough and no fever   Risk factors: sick contacts        Prior to Admission medications  Medication Sig Start Date End Date Taking? Authorizing Provider  calcium carbonate (TUMS - DOSED IN MG ELEMENTAL CALCIUM) 500 MG chewable tablet Chew 2 tablets by mouth daily.    [provider]  eletriptan  (RELPAX ) 40 MG tablet TAKE 1 TABLET BY MOUTH AT ONSET OF HEADACHE. MAY REPEAT IN 2 HOURS IF HEADACHE PERSISTS OR RECURS Patient not taking: Reported on 08/30/2024 01/04/23   Von [place], MD  HYDROcodone-acetaminophen  (NORCO/VICODIN) 5-325 MG tablet Take by mouth. Patient not taking: Reported on 08/30/2024 05/27/21   [provider]  hydrocortisone  (CORTEF ) 20 MG tablet Take 1 tablet in the morning and 1/2 tab in the afternoon and take additional / double the dose in case of illness. 09/13/23    Thapa, [place], MD  hydrOXYzine  (ATARAX ) 10 MG tablet Take 1 tablet (10 mg total) by mouth at bedtime as needed. Patient not taking: Reported on 08/30/2024 07/14/23   Levora Reyes SAUNDERS, MD  levothyroxine  (SYNTHROID ) 25 MCG tablet TAKE 1 TABLET BY MOUTH DAILY BEFORE BREAKFAST 02/07/24   Thapa, [place], MD  Multiple Vitamins-Minerals (MULTIVITAMIN ADULT) CHEW Chew 1 each by mouth daily.    [provider]  naproxen sodium (ANAPROX) 220 MG tablet Take 660 mg by mouth as needed (pain). Patient not taking: Reported on 08/30/2024    [provider]    Allergies: Vibramycin [doxycycline calcium]    Review of Systems  Constitutional:  Negative for chills and fever.  Respiratory:  Negative for cough.   Gastrointestinal:  Positive for diarrhea and vomiting. Negative for abdominal pain.    Updated Vital Signs BP (!) 77/65   Pulse 92   Temp 98 F (36.7 C)   Resp (!) 22   SpO2 100%   Physical Exam Vitals and nursing note reviewed.  Constitutional:      General: She is not in acute distress.    Appearance: Normal appearance. She is well-developed.  HENT:     Head: Normocephalic and atraumatic.  Eyes:     Conjunctiva/sclera: Conjunctivae normal.  Cardiovascular:     Rate and Rhythm: Normal rate and regular rhythm.     Heart sounds: No murmur heard. Pulmonary:  Effort: Pulmonary effort is normal. No respiratory distress.     Breath sounds: Normal breath sounds. No stridor. No wheezing.  Abdominal:     Palpations: Abdomen is soft.     Tenderness: There is no abdominal tenderness. There is no guarding or rebound.  Musculoskeletal:        General: No tenderness or deformity.     Cervical back: Neck supple.  Skin:    General: Skin is warm and dry.     Findings: Rash present.     Comments: She has some erythema on the inner parts of both of her knees.  She also has a little diffuse erythema across her back.  Neurological:     General: No focal deficit present.     Mental  Status: She is alert. She is disoriented.     GCS: GCS eye subscore is 4. GCS verbal subscore is 5. GCS motor subscore is 6.     Motor: No weakness.     (all labs ordered are listed, but only abnormal results are displayed) Labs Reviewed  GASTROINTESTINAL PANEL BY PCR, STOOL (REPLACES STOOL CULTURE)  C DIFFICILE QUICK SCREEN W PCR REFLEX    RESP PANEL BY RT-PCR (RSV, FLU A&B, COVID)  [id number]  COMPREHENSIVE METABOLIC PANEL WITH GFR  CBC WITH DIFFERENTIAL/PLATELET  MAGNESIUM   URINALYSIS, ROUTINE W REFLEX MICROSCOPIC  LIPASE, BLOOD    EKG: None  Radiology: No results found.  {Document cardiac monitor, telemetry assessment procedure when appropriate:32947} Procedures   Medications Ordered in the ED  ondansetron  (ZOFRAN ) injection 4 mg (has no administration in time range)  sodium chloride  0.9 % bolus 500 mL (has no administration in time range)      {Click here for ABCD2, HEART and other calculators REFRESH Note before signing:1}                              Medical Decision Making Amount and/or Complexity of Data Reviewed Labs: ordered.  Risk Prescription drug management.   This patient complains of ***; this involves an extensive number of treatment Options and is a complaint that carries with it a high risk of complications and morbidity. The differential includes ***  I ordered, reviewed and interpreted labs, which included *** I ordered medication *** and reviewed PMP when indicated. I ordered imaging studies which included *** and I independently    visualized and interpreted imaging which showed *** Additional history obtained from *** Previous records obtained and reviewed *** I consulted *** and discussed lab and imaging findings and discussed disposition.  Cardiac monitoring reviewed, *** Social determinants considered, *** Critical Interventions: ***  After the interventions stated above, I reevaluated the patient and found *** Admission and further  testing considered, ***   {Document critical care time when appropriate  Document review of labs and clinical decision tools ie CHADS2VASC2, etc  Document your independent review of radiology images and any outside records  Document your discussion with family members, caretakers and with consultants  Document social determinants of health affecting pt's care  Document your decision making why or why not admission, treatments were needed:32947:::1}   Final diagnoses:  None    ED Discharge Orders     None        "

## 2024-09-18 DIAGNOSIS — I959 Hypotension, unspecified: Secondary | ICD-10-CM | POA: Diagnosis present

## 2024-09-18 DIAGNOSIS — R410 Disorientation, unspecified: Secondary | ICD-10-CM | POA: Diagnosis present

## 2024-09-18 DIAGNOSIS — G9341 Metabolic encephalopathy: Secondary | ICD-10-CM | POA: Diagnosis present

## 2024-09-18 DIAGNOSIS — B001 Herpesviral vesicular dermatitis: Secondary | ICD-10-CM | POA: Diagnosis present

## 2024-09-18 DIAGNOSIS — E039 Hypothyroidism, unspecified: Secondary | ICD-10-CM | POA: Diagnosis present

## 2024-09-18 DIAGNOSIS — G9340 Encephalopathy, unspecified: Secondary | ICD-10-CM | POA: Diagnosis not present

## 2024-09-18 DIAGNOSIS — E2749 Other adrenocortical insufficiency: Secondary | ICD-10-CM | POA: Diagnosis present

## 2024-09-18 DIAGNOSIS — E66811 Obesity, class 1: Secondary | ICD-10-CM | POA: Diagnosis present

## 2024-09-18 DIAGNOSIS — M6282 Rhabdomyolysis: Secondary | ICD-10-CM | POA: Diagnosis present

## 2024-09-18 DIAGNOSIS — R21 Rash and other nonspecific skin eruption: Secondary | ICD-10-CM | POA: Diagnosis present

## 2024-09-18 DIAGNOSIS — Z1152 Encounter for screening for COVID-19: Secondary | ICD-10-CM | POA: Diagnosis not present

## 2024-09-18 DIAGNOSIS — Z7952 Long term (current) use of systemic steroids: Secondary | ICD-10-CM | POA: Diagnosis not present

## 2024-09-18 DIAGNOSIS — E876 Hypokalemia: Secondary | ICD-10-CM | POA: Diagnosis not present

## 2024-09-18 DIAGNOSIS — E860 Dehydration: Secondary | ICD-10-CM | POA: Diagnosis present

## 2024-09-18 DIAGNOSIS — R918 Other nonspecific abnormal finding of lung field: Secondary | ICD-10-CM | POA: Diagnosis present

## 2024-09-18 DIAGNOSIS — Z79890 Hormone replacement therapy (postmenopausal): Secondary | ICD-10-CM | POA: Diagnosis not present

## 2024-09-18 DIAGNOSIS — E8721 Acute metabolic acidosis: Secondary | ICD-10-CM | POA: Diagnosis present

## 2024-09-18 DIAGNOSIS — Z79899 Other long term (current) drug therapy: Secondary | ICD-10-CM | POA: Diagnosis not present

## 2024-09-18 DIAGNOSIS — R9431 Abnormal electrocardiogram [ECG] [EKG]: Secondary | ICD-10-CM | POA: Diagnosis present

## 2024-09-18 DIAGNOSIS — Z87891 Personal history of nicotine dependence: Secondary | ICD-10-CM | POA: Diagnosis not present

## 2024-09-18 DIAGNOSIS — A0811 Acute gastroenteropathy due to Norwalk agent: Secondary | ICD-10-CM | POA: Diagnosis present

## 2024-09-18 DIAGNOSIS — Z8249 Family history of ischemic heart disease and other diseases of the circulatory system: Secondary | ICD-10-CM | POA: Diagnosis not present

## 2024-09-18 DIAGNOSIS — Z6831 Body mass index (BMI) 31.0-31.9, adult: Secondary | ICD-10-CM | POA: Diagnosis not present

## 2024-09-18 DIAGNOSIS — N179 Acute kidney failure, unspecified: Secondary | ICD-10-CM | POA: Diagnosis present

## 2024-09-18 LAB — COMPREHENSIVE METABOLIC PANEL WITH GFR
ALT: 41 U/L (ref 0–44)
AST: 91 U/L — ABNORMAL HIGH (ref 15–41)
Albumin: 3.4 g/dL — ABNORMAL LOW (ref 3.5–5.0)
Alkaline Phosphatase: 49 U/L (ref 38–126)
Anion gap: 16 — ABNORMAL HIGH (ref 5–15)
BUN: 24 mg/dL — ABNORMAL HIGH (ref 8–23)
CO2: 12 mmol/L — ABNORMAL LOW (ref 22–32)
Calcium: 8.2 mg/dL — ABNORMAL LOW (ref 8.9–10.3)
Chloride: 114 mmol/L — ABNORMAL HIGH (ref 98–111)
Creatinine, Ser: 0.96 mg/dL (ref 0.44–1.00)
GFR, Estimated: >60 mL/min
Glucose, Bld: 104 mg/dL — ABNORMAL HIGH (ref 70–99)
Potassium: 4.5 mmol/L (ref 3.5–5.1)
Sodium: 141 mmol/L (ref 135–145)
Total Bilirubin: 0.4 mg/dL (ref 0.0–1.2)
Total Protein: 5.8 g/dL — ABNORMAL LOW (ref 6.5–8.1)

## 2024-09-18 LAB — URINE DRUG SCREEN
Amphetamines: NEGATIVE
Barbiturates: NEGATIVE
Benzodiazepines: NEGATIVE
Cocaine: NEGATIVE
Fentanyl: NEGATIVE
Methadone Scn, Ur: NEGATIVE
Opiates: NEGATIVE
Tetrahydrocannabinol: NEGATIVE

## 2024-09-18 LAB — CBC
HCT: 36.7 % (ref 36.0–46.0)
Hemoglobin: 12.2 g/dL (ref 12.0–15.0)
MCH: 28.9 pg (ref 26.0–34.0)
MCHC: 33.2 g/dL (ref 30.0–36.0)
MCV: 87 fL (ref 80.0–100.0)
Platelets: 164 K/uL (ref 150–400)
RBC: 4.22 MIL/uL (ref 3.87–5.11)
RDW: 13.3 % (ref 11.5–15.5)
WBC: 11 K/uL — ABNORMAL HIGH (ref 4.0–10.5)
nRBC: 0 % (ref 0.0–0.2)

## 2024-09-18 LAB — CK: Total CK: 2563 U/L — ABNORMAL HIGH (ref 38–234)

## 2024-09-18 LAB — PROCALCITONIN: Procalcitonin: 1.19 ng/mL

## 2024-09-18 LAB — MAGNESIUM: Magnesium: 2.6 mg/dL — ABNORMAL HIGH (ref 1.7–2.4)

## 2024-09-18 LAB — HIV ANTIBODY (ROUTINE TESTING W REFLEX): HIV Screen 4th Generation wRfx: NONREACTIVE

## 2024-09-18 MED ORDER — KETOROLAC TROMETHAMINE 15 MG/ML IJ SOLN
15.0000 mg | Freq: Once | INTRAMUSCULAR | Status: AC
  Administered 2024-09-18: 15 mg via INTRAVENOUS
  Filled 2024-09-18: qty 1

## 2024-09-18 MED ORDER — HYDROCORTISONE 1 % EX CREA
TOPICAL_CREAM | Freq: Two times a day (BID) | CUTANEOUS | Status: DC
  Administered 2024-09-19: 1 via TOPICAL
  Filled 2024-09-18: qty 28

## 2024-09-18 MED ORDER — MELATONIN 5 MG PO TABS
5.0000 mg | ORAL_TABLET | Freq: Once | ORAL | Status: AC
  Administered 2024-09-18: 5 mg via ORAL
  Filled 2024-09-18: qty 1

## 2024-09-18 MED ORDER — HYDROCORTISONE SOD SUC (PF) 100 MG IJ SOLR
100.0000 mg | Freq: Two times a day (BID) | INTRAMUSCULAR | Status: DC
  Administered 2024-09-18: 100 mg via INTRAVENOUS
  Filled 2024-09-18: qty 2

## 2024-09-18 MED ORDER — SODIUM CHLORIDE 0.9 % IV SOLN: INTRAVENOUS | Status: AC

## 2024-09-18 MED ORDER — HYDROCORTISONE SOD SUC (PF) 100 MG IJ SOLR: 50.0000 mg | Freq: Two times a day (BID) | INTRAMUSCULAR | Status: DC

## 2024-09-18 MED ORDER — SODIUM BICARBONATE 650 MG PO TABS
650.0000 mg | ORAL_TABLET | Freq: Two times a day (BID) | ORAL | Status: DC
  Administered 2024-09-18 – 2024-09-20 (×4): 650 mg via ORAL
  Filled 2024-09-18 (×5): qty 1

## 2024-09-18 MED ORDER — SODIUM CHLORIDE 0.9 % IV BOLUS
1000.0000 mL | Freq: Once | INTRAVENOUS | Status: AC
  Administered 2024-09-18: 1000 mL via INTRAVENOUS

## 2024-09-18 MED ORDER — LORAZEPAM 2 MG/ML IJ SOLN
1.0000 mg | Freq: Once | INTRAMUSCULAR | Status: AC
  Administered 2024-09-18: 1 mg via INTRAVENOUS
  Filled 2024-09-18: qty 1

## 2024-09-18 MED ORDER — LEVOTHYROXINE SODIUM 25 MCG PO TABS
25.0000 ug | ORAL_TABLET | Freq: Every day | ORAL | Status: DC
  Administered 2024-09-19: 25 ug via ORAL
  Filled 2024-09-18 (×2): qty 1

## 2024-09-18 MED ORDER — HYDROCORTISONE 5 MG PO TABS
10.0000 mg | ORAL_TABLET | Freq: Every day | ORAL | Status: DC
  Administered 2024-09-18 – 2024-09-19 (×2): 10 mg via ORAL
  Filled 2024-09-18 (×2): qty 2

## 2024-09-18 MED ORDER — HYDROCORTISONE 20 MG PO TABS
20.0000 mg | ORAL_TABLET | Freq: Every day | ORAL | Status: DC
  Administered 2024-09-19 – 2024-09-20 (×2): 20 mg via ORAL
  Filled 2024-09-18 (×2): qty 1

## 2024-09-18 MED ORDER — ORAL CARE MOUTH RINSE: 15.0000 mL | OROMUCOSAL | Status: DC | PRN

## 2024-09-18 MED ORDER — ONDANSETRON HCL 4 MG/2ML IJ SOLN
4.0000 mg | Freq: Once | INTRAMUSCULAR | Status: AC
  Administered 2024-09-18: 4 mg via INTRAVENOUS
  Filled 2024-09-18: qty 2

## 2024-09-18 NOTE — Plan of Care (Signed)

## 2024-09-18 NOTE — Progress Notes (Signed)
 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  At the middle of the night she was found to be confused, lying on the floor, feeling sick.  Brought to the ER. In the emergency room blood pressure stable.  On room air.  WBC 14.6, creatinine 1.54, magnesium  1.6.  Lactic acid 2.  CT head no acute findings.  CT chest abdomen pelvis no acute findings.  Admitted with altered mental status, severe dehydration.  Also found to have CK levels of 2500.  Subjective: Patient seen and examined.  Her husband was at the bedside.  Her son arrived later. Patient was still feeling tired, however she is with clear mentation and hungry.  She wanted some coffee.  No loose stool since coming to the floor.  Denies any nausea.   Assessment & Plan:   Severe gastroenteritis likely viral gastroenteritis.  Family members with norovirus. Symptoms improving.  Is still inadequate oral intake.  High risk of dehydration. Continue maintenance fluid.  Encourage oral intake.  If any loose stool, will check GI pathogen panel.  Otherwise this will be self-limiting. Started on cefepime  and Flagyl  with elevated procalcitonin, will continue today until stool studies are available.  Acute metabolic encephalopathy, likely combination of dehydration, steroid insufficiency. CT head nonfocal. Mental status improving. Start mobilizing, allow regular diet.  AKI with moderate dehydration: As above.  Maintenance fluids.  Hypomagnesemia: Replaced.  Recheck tomorrow.  Traumatic rhabdomyolysis: Likely due to laying on the floor.  Renal functions improving.  Will repeat CK levels tomorrow.  Continue maintenance fluid.  Adrenal  insufficiency: On Solu-Cortef  20/10 at home.  Will continue high-dose hydrocortisone  today.    DVT prophylaxis: SCDs Start: 09/17/24 2137   Code Status: Full code Family Communication: Husband at the bedside Disposition Plan: Status is: Observation The patient will require care spanning > 2 midnights and should be moved to inpatient because: IV fluids, IV antibiotics     Consultants:  None  Procedures:  None  Antimicrobials:  Cefepime  Flagyl  12/21---     Objective: Vitals:   09/17/24 2154 09/17/24 2201 09/18/24 0237 09/18/24 0605  BP: 118/80  (!) 121/53 102/67  Pulse: (!) 106  (!) 101 95  Resp:   20   Temp: 99.2 F (37.3 C)  98.6 F (37 C) 98.6 F (37 C)  TempSrc: Oral  Oral Oral  SpO2: (!) 84% 97% 99% 98%  Weight:      Height:        Intake/Output Summary (Last 24 hours) at 09/18/2024 1143 Last data filed at 09/18/2024 0601 Gross per 24 hour  Intake 1601.34 ml  Output --  Net 1601.34 ml   Filed Weights   09/17/24 1856  Weight: 83.6 kg    Examination:  General exam: Appears calm and comfortable.  Looks lethargic.  She is able to wake up and have regular conversation.  Pleasant interaction. Mucous membranes are still dry. Patient is alert awake and oriented.  No focal neurological deficits. Respiratory system: Clear to auscultation. Respiratory effort normal. Cardiovascular system: S1 & S2 heard, RRR. No JVD, murmurs, rubs, gallops or clicks. No pedal edema. Gastrointestinal system: Abdomen is nondistended, soft and nontender. No organomegaly or masses felt. Normal bowel sounds heard. Central nervous system: Alert  and oriented. No focal neurological deficits.    Data Reviewed: I have personally reviewed following labs and imaging studies  CBC: Recent Labs  Lab 09/17/24 1720 09/18/24 0329  WBC 14.6* 11.0*  NEUTROABS 7.9*  --   HGB 13.4 12.2  HCT 40.4 36.7  MCV 87.4 87.0  PLT 165 164   Basic Metabolic Panel: Recent Labs  Lab 09/11/24 1405  09/17/24 1720 09/18/24 0329  NA 140 142 141  K 4.6 3.5 4.5  CL 101 110 114*  CO2 31 20* 12*  GLUCOSE 96 89 104*  BUN 16 31* 24*  CREATININE 0.75 1.54* 0.96  CALCIUM 10.0 8.5* 8.2*  MG  --  1.6* 2.6*   GFR: Estimated Creatinine Clearance: 57.9 mL/min (by C-G formula based on SCr of 0.96 mg/dL). Liver Function Tests: Recent Labs  Lab 09/17/24 1720 09/18/24 0329  AST 85* 91*  ALT 40 41  ALKPHOS 51 49  BILITOT 0.5 0.4  PROT 6.0* 5.8*  ALBUMIN 3.7 3.4*   Recent Labs  Lab 09/17/24 1720  LIPASE 47   Recent Labs  Lab 09/17/24 2041  AMMONIA 30   Coagulation Profile: Recent Labs  Lab 09/17/24 1720  INR 1.1   Cardiac Enzymes: Recent Labs  Lab 09/17/24 2300  CKTOTAL 2,563*   BNP (last 3 results) No results for input(s): PROBNP in the last 8760 hours. HbA1C: No results for input(s): HGBA1C in the last 72 hours. CBG: No results for input(s): GLUCAP in the last 168 hours. Lipid Profile: No results for input(s): CHOL, HDL, LDLCALC, TRIG, CHOLHDL, LDLDIRECT in the last 72 hours. Thyroid  Function Tests: No results for input(s): TSH, T4TOTAL, FREET4, T3FREE, THYROIDAB in the last 72 hours. Anemia Panel: Recent Labs    09/17/24 2041  VITAMINB12 499   Sepsis Labs: Recent Labs  Lab 09/17/24 1734 09/17/24 1936 09/17/24 2041 09/18/24 0329  PROCALCITON  --   --  1.67 1.19  LATICACIDVEN 2.0* 1.7  --   --     Recent Results (from the past 240 hours)  Culture, blood (routine x 2)     Status: None (Preliminary result)   Collection Time: 09/17/24  5:20 PM   Specimen: BLOOD RIGHT WRIST  Result Value Ref Range Status   Specimen Description   Final    BLOOD RIGHT WRIST Performed at [facility], [address], [place], [place] [address]    Special Requests   Final    BOTTLES DRAWN AEROBIC AND ANAEROBIC Blood Culture results may not be optimal due to an inadequate volume of blood received in culture bottles Performed at [facility], [address], [place], [place] [address]    Culture   Final    NO GROWTH < 24 HOURS Performed at [facility], [address] [place], [place] [address]    Report Status PENDING  Incomplete  Culture, blood (routine x 2)     Status: None (Preliminary result)   Collection Time: 09/17/24  5:30 PM   Specimen: BLOOD LEFT HAND  Result Value Ref Range Status   Specimen Description   Final    BLOOD LEFT HAND Performed at [facility], [address], [place], [place] [address]    Special Requests   Final    BOTTLES DRAWN AEROBIC ONLY Blood Culture results may not be optimal due to an inadequate volume of blood received in culture bottles Performed at [facility], [address] [place], [place] [address]  Culture   Final    NO GROWTH < 24 HOURS Performed at [facility], [address] [place], [place] [address]    Report Status PENDING  Incomplete  Resp panel by RT-PCR (RSV, Flu A&B, Covid) Anterior Nasal Swab     Status: None   Collection Time: 09/17/24  5:46 PM   Specimen: Anterior Nasal Swab  Result Value Ref Range Status   SARS Coronavirus 2 by RT PCR NEGATIVE NEGATIVE Final    Comment: (NOTE) SARS-CoV-2 target nucleic acids are NOT DETECTED.  The SARS-CoV-2 RNA is generally detectable in upper respiratory specimens during the acute phase of infection. The lowest concentration of SARS-CoV-2 viral copies this assay can detect is 138 copies/mL. A negative result does not preclude SARS-Cov-2 infection and should not be used as the sole basis for treatment or other patient management decisions. A negative result may occur with  improper specimen collection/handling, submission of specimen other than nasopharyngeal swab, presence of viral mutation(s) within the areas targeted by this assay, and inadequate number of viral copies(<138 copies/mL). A negative result must be combined with clinical observations,  patient history, and epidemiological information. The expected result is Negative.  Fact Sheet for Patients:  bloggercourse.com  Fact Sheet for Healthcare Providers:  seriousbroker.it  This test is no t yet approved or cleared by the [place]  FDA and  has been authorized for detection and/or diagnosis of SARS-CoV-2 by FDA under an Emergency Use Authorization (EUA). This EUA will remain  in effect (meaning this test can be used) for the duration of the COVID-19 declaration under Section 564(b)(1) of the Act, 21 U.S.C.section 360bbb-3(b)(1), unless the authorization is terminated  or revoked sooner.       Influenza A by PCR NEGATIVE NEGATIVE Final   Influenza B by PCR NEGATIVE NEGATIVE Final    Comment: (NOTE) The Xpert Xpress SARS-CoV-2/FLU/RSV plus assay is intended as an aid in the diagnosis of influenza from Nasopharyngeal swab specimens and should not be used as a sole basis for treatment. Nasal washings and aspirates are unacceptable for Xpert Xpress SARS-CoV-2/FLU/RSV testing.  Fact Sheet for Patients: bloggercourse.com  Fact Sheet for Healthcare Providers: seriousbroker.it  This test is not yet approved or cleared by the [place]  FDA and has been authorized for detection and/or diagnosis of SARS-CoV-2 by FDA under an Emergency Use Authorization (EUA). This EUA will remain in effect (meaning this test can be used) for the duration of the COVID-19 declaration under Section 564(b)(1) of the Act, 21 U.S.C. section 360bbb-3(b)(1), unless the authorization is terminated or revoked.     Resp Syncytial Virus by PCR NEGATIVE NEGATIVE Final    Comment: (NOTE) Fact Sheet for Patients: bloggercourse.com  Fact Sheet for Healthcare Providers: seriousbroker.it  This test is not yet approved or cleared by the [place]  [place] FDA and has been authorized for detection and/or diagnosis of SARS-CoV-2 by FDA under an Emergency Use Authorization (EUA). This EUA will remain in effect (meaning this test can be used) for the duration of the COVID-19 declaration under Section 564(b)(1) of the Act, 21 U.S.C. section 360bbb-3(b)(1), unless the authorization is terminated or revoked.  Performed at [facility], [address] [place], [place] [address]          Radiology Studies: CT CHEST ABDOMEN PELVIS W CONTRAST Addendum Date: 09/17/2024 ADDENDUM REPORT: 09/17/2024 23:14 ADDENDUM: There is a typographical error in the body of the report in the lungs section: The last sentence should read: No pneumothorax  is noted. Electronically Signed   By: Oneil Devonshire M.D.   On: 09/17/2024 23:14   Result Date: 09/17/2024 CLINICAL DATA:  Clinical history is possible sepsis, nausea and vomiting for 2 days. EXAM: CT CHEST, ABDOMEN, AND PELVIS WITH CONTRAST TECHNIQUE: Multidetector CT imaging of the chest, abdomen and pelvis was performed following the standard protocol during bolus administration of intravenous contrast. RADIATION DOSE REDUCTION: This exam was performed according to the departmental dose-optimization program which includes automated exposure control, adjustment of the mA and/or kV according to patient size and/or use of iterative reconstruction technique. CONTRAST:  OMNIPAQUE  IOHEXOL  300 MG/ML  SOLN COMPARISON:  05/07/2008 FINDINGS: CT CHEST FINDINGS Cardiovascular: Atherosclerotic calcifications of the thoracic aorta are noted. No cardiac enlargement is seen. No aneurysmal dilatation or dissection of the aorta is noted. The pulmonary artery shows no large central pulmonary embolus. Timing was not performed for embolus evaluation. Mediastinum/Nodes: Thoracic inlet is within normal limits. The esophagus is unremarkable. No hilar or mediastinal adenopathy is noted. Lungs/Pleura: Biapical  pleuroparenchymal scarring is noted. Lungs are well aerated bilaterally. Some subpleural nodularity is noted in the right upper lobe best seen on image number 127 of series 6. No sizable parenchymal nodules are noted. No pleural effusion is seen. Pneumothorax is noted. Musculoskeletal: Mild degenerative changes of the thoracic spine are seen. CT ABDOMEN PELVIS FINDINGS Hepatobiliary: Gallbladder has been surgically removed. Liver is within normal limits. Pancreas: Unremarkable. No pancreatic ductal dilatation or surrounding inflammatory changes. Spleen: Normal in size without focal abnormality. Adrenals/Urinary Tract: Adrenal glands are within normal limits. Kidneys demonstrate a normal enhancement pattern bilaterally. Normal excretion is seen bilaterally. No renal calculi are noted. No obstructive changes are seen. The bladder is decompressed. Stomach/Bowel: The appendix is within normal limits. No obstructive or inflammatory changes of the colon are seen. The stomach and small bowel are within normal limits. Vascular/Lymphatic: Aortic atherosclerosis. No enlarged abdominal or pelvic lymph nodes. Reproductive: Status post hysterectomy. No adnexal masses. Other: No abdominal wall hernia or abnormality. No abdominopelvic ascites. Musculoskeletal: No acute or significant osseous findings. IMPRESSION: CT of the chest: Scattered small subpleural nodules. No follow-up needed if patient is low-risk (and has no known or suspected primary neoplasm). Non-contrast chest CT can be considered in 12 months if patient is high-risk. This recommendation follows the consensus statement: Guidelines for Management of Incidental Pulmonary Nodules Detected on CT Images: From the Fleischner Society 2017; Radiology 2017; 284:228-243. No other focal abnormality is noted. CT of the abdomen and pelvis: No acute abnormality noted. Electronically Signed: By: Oneil Devonshire M.D. On: 09/17/2024 19:23   CT Head Wo Contrast Result Date:  09/17/2024 CLINICAL DATA:  Altered mental status and nausea and vomiting EXAM: CT HEAD WITHOUT CONTRAST TECHNIQUE: Contiguous axial images were obtained from the base of the skull through the vertex without intravenous contrast. RADIATION DOSE REDUCTION: This exam was performed according to the departmental dose-optimization program which includes automated exposure control, adjustment of the mA and/or kV according to patient size and/or use of iterative reconstruction technique. COMPARISON:  07/29/2008 FINDINGS: Brain: No evidence of acute infarction, hemorrhage, hydrocephalus, extra-axial collection or mass lesion/mass effect. Vascular: No hyperdense vessel or unexpected calcification. Skull: Normal. Negative for fracture or focal lesion. Sinuses/Orbits: No acute finding. Other: None. IMPRESSION: No acute intracranial abnormality noted. Electronically Signed   By: Oneil Devonshire M.D.   On: 09/17/2024 19:27        Scheduled Meds:  hydrocortisone  cream   Topical BID   hydrocortisone  sod succinate (SOLU-CORTEF )  inj  100 mg Intravenous Q12H   [START ON 09/19/2024] levothyroxine   25 mcg Oral QAC breakfast   Continuous Infusions:  sodium chloride      ceFEPime  (MAXIPIME ) IV 2 g (09/18/24 0134)   metronidazole  500 mg (09/18/24 0009)     LOS: 0 days      Renato Applebaum, MD [facility]   "

## 2024-09-18 NOTE — Progress Notes (Addendum)
" ° ° ° °  Patient Name: Elizabeth Lamb           DOB: 1956/08/11  MRN: [id number]      Admission Date: 09/17/2024  Attending Provider: Waddell Rake, MD  Primary Diagnosis: Acute encephalopathy   Level of care: Progressive   OVERNIGHT EVENT  Notified by bedside RN that family is concerned with patient remaining restless, unable to fall asleep, confused.  Patient is currently attempting to remove medical equipment. She is currently unable to take p.o. meds as she is unable to follow commands safely.   Some relief achieved with Ativan , will redosed to assist with sleep and restlessness.   Additionally, CK level 2563.  Elevated CK could be related to dehydration, hypotension, prolong downtime when found on the ground Will add on IV fluids and bolus.  Monitor renal function in the morning.  Monitor urine output.   Rutledge Selsor, DNP, ACNPC- AG [facility]    "

## 2024-09-18 NOTE — Consult Note (Signed)
 WOC team consulted for rash to inner knees and neck.  This rash was noted by admitting provider.  The rash does not appear in areas of moisture or pressure.  Recommend trial of hydrocortisone  cream and notify MD if worsening.   WOC team will sign off. Reconsult if further needs arise.    Thank you,    Powell Bar MSN, RN-BC, [employer]

## 2024-09-18 NOTE — Progress Notes (Signed)
" °   09/18/24 1442  TOC Brief Assessment  Insurance and Status Reviewed  Patient has primary care physician Yes (GREENE, JEFFREY R)  Home environment has been reviewed From home with spouse  Prior level of function: Need assistance/spouse  Prior/Current Home Services No current home services  Social Drivers of Health Review SDOH reviewed no interventions necessary  Readmission risk has been reviewed Yes  Transition of care needs no transition of care needs at this time    "

## 2024-09-18 NOTE — Care Management Obs Status (Signed)
 MEDICARE OBSERVATION STATUS NOTIFICATION   Patient Details  Name: Elizabeth Lamb MRN: [id number] Date of Birth: [birth year]   Medicare Observation Status Notification Given:  Yes (spouse signed)    Lorraine LILLETTE Fenton, LCSW 09/18/2024, 12:04 PM

## 2024-09-18 NOTE — Evaluation (Signed)
 Physical Therapy Evaluation Patient Details Name: Elizabeth Lamb MRN: [id number] DOB: May 23, 1956 Today's Date: 09/18/2024  History of Present Illness  68 yo female admitted with acute encephalopathy, hypotension, viral gastroenteritis, rhabdomyolysis, AKI. Hx of adrenal insufficiency, hypothyroidism  Clinical Impression  On eval, pt required Min A for mobility. She ambulated ~35 feet around the room with support of IV pole and steadying assist from therapist. Pt presents with general weakness, decreased activity tolerance, and impaired gait/balance. She fatigued fairly easily with activity on today. Pt exhibited some cognitive deficits during session: impaired memory, impaired processing/problem solving, decreased safety awareness. Daughter present during session and reports pt's cognition has improved compared to yesterday. Discussed d/c plan-plan is for pt to return home where she lives with her husband. Discussed need for 24/7 supervision at this time (until pt nears her baseline cognitively and physically). Will recommend HHPT, RW and 3n1. Will continue to follow and progress activity as safely able.         If plan is discharge home, recommend the following: A little help with walking and/or transfers;A little help with bathing/dressing/bathroom;Assistance with cooking/housework;Assist for transportation;Help with stairs or ramp for entrance;Supervision due to cognitive status;Direct supervision/assist for financial management;Direct supervision/assist for medications management   Can travel by private vehicle        Equipment Recommendations Rolling walker (2 wheels);BSC/3in1  Recommendations for Other Services  OT consult    Functional Status Assessment Patient has had a recent decline in their functional status and demonstrates the ability to make significant improvements in function in a reasonable and predictable amount of time.     Precautions / Restrictions  Precautions Precautions: Fall Precaution/Restrictions Comments: n/v/d Restrictions Weight Bearing Restrictions Per Provider Order: No      Mobility  Bed Mobility Overal bed mobility: Needs Assistance Bed Mobility: Supine to Sit, Sit to Supine     Supine to sit: Contact guard, HOB elevated, Used rails Sit to supine: Contact guard assist, Used rails   General bed mobility comments: Increased time. Close guarding for safety.    Transfers Overall transfer level: Needs assistance Equipment used: 1 person hand held assist Transfers: Sit to/from Stand Sit to Stand: Min assist           General transfer comment: Assist to rise, steady, control descent. Cues for safety, hand placement    Ambulation/Gait Ambulation/Gait assistance: Min assist Gait Distance (Feet): 35 Feet Assistive device: IV Pole Gait Pattern/deviations: Step-through pattern, Decreased stride length, Decreased step length - right, Decreased step length - left       General Gait Details: Cues for safety, for increased step lengths bilaterally, and pacing. Unsteady. Fatigues easily. Fall risk.  Stairs            Wheelchair Mobility     Tilt Bed    Modified Rankin (Stroke Patients Only)       Balance Overall balance assessment: Needs assistance, History of Falls Sitting-balance support: Feet supported Sitting balance-Leahy Scale: Fair     Standing balance support: Single extremity supported, During functional activity Standing balance-Leahy Scale: Poor                               Pertinent Vitals/Pain Pain Assessment Pain Assessment: Faces Faces Pain Scale: Hurts little more Pain Location: generalized soreness Pain Descriptors / Indicators: Discomfort, Sore Pain Intervention(s): Monitored during session, Limited activity within patient's tolerance, Repositioned    Home Living Family/patient expects to be discharged  to:: Private residence Living Arrangements:  Spouse/significant other   Type of Home: House Home Access: Stairs to enter Entrance Stairs-Rails: Doctor, General Practice of Steps: 4 Alternate Level Stairs-Number of Steps: 1 flight Home Layout: One level;Able to live on main level with bedroom/bathroom;Two level Home Equipment: None      Prior Function Prior Level of Function : Independent/Modified Independent             Mobility Comments: ind. plays piano, worked as [employer] ADLs Comments: husband helps with bra.     Extremity/Trunk Assessment   Upper Extremity Assessment Upper Extremity Assessment: Defer to OT evaluation    Lower Extremity Assessment Lower Extremity Assessment: Generalized weakness    Cervical / Trunk Assessment Cervical / Trunk Assessment: Normal  Communication   Communication Communication: No apparent difficulties    Cognition Arousal: Alert Behavior During Therapy: Flat affect   PT - Cognitive impairments: Problem solving, Memory, Attention, Safety/Judgement                         Following commands: Impaired Following commands impaired: Follows one step commands with increased time     Cueing Cueing Techniques: Verbal cues, Tactile cues, Visual cues     General Comments      Exercises General Exercises - Lower Extremity Ankle Circles/Pumps: AROM, Both Long Arc Quad: AROM, Both Hip Flexion/Marching: AROM, Both   Assessment/Plan    PT Assessment Patient needs continued PT services  PT Problem List Decreased strength;Decreased activity tolerance;Decreased balance;Decreased mobility;Decreased knowledge of use of DME;Decreased cognition;Decreased safety awareness;Pain       PT Treatment Interventions Gait training;DME instruction;Functional mobility training;Therapeutic activities;Therapeutic exercise;Patient/family education;Balance training    PT Goals (Current goals can be found in the Care Plan section)  Acute Rehab PT Goals Patient Stated Goal: home;  regain plof PT Goal Formulation: With patient/family Time For Goal Achievement: 10/02/24 Potential to Achieve Goals: Good    Frequency Min 3X/week     Co-evaluation               AM-PAC PT 6 Clicks Mobility  Outcome Measure Help needed turning from your back to your side while in a flat bed without using bedrails?: A Little Help needed moving from lying on your back to sitting on the side of a flat bed without using bedrails?: A Little Help needed moving to and from a bed to a chair (including a wheelchair)?: A Little Help needed standing up from a chair using your arms (e.g., wheelchair or bedside chair)?: A Little Help needed to walk in hospital room?: A Little Help needed climbing 3-5 steps with a railing? : A Lot 6 Click Score: 17    End of Session Equipment Utilized During Treatment: Gait belt Activity Tolerance: Patient tolerated treatment well;Patient limited by fatigue Patient left: in bed;with call bell/phone within reach;with bed alarm set;with family/visitor present   PT Visit Diagnosis: Unsteadiness on feet (R26.81);History of falling (Z91.81);Muscle weakness (generalized) (M62.81);Difficulty in walking, not elsewhere classified (R26.2)    Time: 8495-8458 PT Time Calculation (min) (ACUTE ONLY): 37 min   Charges:   PT Evaluation $PT Eval Low Complexity: 1 Low PT Treatments $Gait Training: 8-22 mins PT General Charges $$ ACUTE PT VISIT: 1 Visit          Dannial SQUIBB, PT Acute Rehabilitation  Office: [phone]

## 2024-09-19 DIAGNOSIS — G9340 Encephalopathy, unspecified: Secondary | ICD-10-CM | POA: Diagnosis not present

## 2024-09-19 LAB — CBC WITH DIFFERENTIAL/PLATELET
Abs Immature Granulocytes: 0.03 K/uL (ref 0.00–0.07)
Basophils Absolute: 0 K/uL (ref 0.0–0.1)
Basophils Relative: 0 %
Eosinophils Absolute: 0 K/uL (ref 0.0–0.5)
Eosinophils Relative: 0 %
HCT: 28.5 % — ABNORMAL LOW (ref 36.0–46.0)
Hemoglobin: 9.6 g/dL — ABNORMAL LOW (ref 12.0–15.0)
Immature Granulocytes: 0 %
Lymphocytes Relative: 38 %
Lymphs Abs: 3.7 K/uL (ref 0.7–4.0)
MCH: 28.8 pg (ref 26.0–34.0)
MCHC: 33.7 g/dL (ref 30.0–36.0)
MCV: 85.6 fL (ref 80.0–100.0)
Monocytes Absolute: 0.7 K/uL (ref 0.1–1.0)
Monocytes Relative: 7 %
Neutro Abs: 5.4 K/uL (ref 1.7–7.7)
Neutrophils Relative %: 55 %
Platelets: 144 K/uL — ABNORMAL LOW (ref 150–400)
RBC: 3.33 MIL/uL — ABNORMAL LOW (ref 3.87–5.11)
RDW: 13.3 % (ref 11.5–15.5)
Smear Review: NORMAL
WBC: 9.8 K/uL (ref 4.0–10.5)
nRBC: 0 % (ref 0.0–0.2)

## 2024-09-19 LAB — COMPREHENSIVE METABOLIC PANEL WITH GFR
ALT: 37 U/L (ref 0–44)
AST: 69 U/L — ABNORMAL HIGH (ref 15–41)
Albumin: 3.1 g/dL — ABNORMAL LOW (ref 3.5–5.0)
Alkaline Phosphatase: 43 U/L (ref 38–126)
Anion gap: 10 (ref 5–15)
BUN: 18 mg/dL (ref 8–23)
CO2: 18 mmol/L — ABNORMAL LOW (ref 22–32)
Calcium: 7.7 mg/dL — ABNORMAL LOW (ref 8.9–10.3)
Chloride: 113 mmol/L — ABNORMAL HIGH (ref 98–111)
Creatinine, Ser: 0.71 mg/dL (ref 0.44–1.00)
GFR, Estimated: >60 mL/min
Glucose, Bld: 135 mg/dL — ABNORMAL HIGH (ref 70–99)
Potassium: 3.6 mmol/L (ref 3.5–5.1)
Sodium: 141 mmol/L (ref 135–145)
Total Bilirubin: 0.5 mg/dL (ref 0.0–1.2)
Total Protein: 5.3 g/dL — ABNORMAL LOW (ref 6.5–8.1)

## 2024-09-19 LAB — CK: Total CK: 1244 U/L — ABNORMAL HIGH (ref 38–234)

## 2024-09-19 LAB — URINE CULTURE: Culture: <10000 — AB

## 2024-09-19 LAB — PATHOLOGIST SMEAR REVIEW

## 2024-09-19 LAB — PHOSPHORUS: Phosphorus: 1.4 mg/dL — ABNORMAL LOW (ref 2.5–4.6)

## 2024-09-19 LAB — ANA W/REFLEX IF POSITIVE: Anti Nuclear Antibody (ANA): NEGATIVE

## 2024-09-19 LAB — MAGNESIUM: Magnesium: 2.4 mg/dL (ref 1.7–2.4)

## 2024-09-19 MED ORDER — POTASSIUM PHOSPHATES 15 MMOLE/5ML IV SOLN
30.0000 mmol | Freq: Once | INTRAVENOUS | Status: AC
  Administered 2024-09-19: 30 mmol via INTRAVENOUS
  Filled 2024-09-19: qty 10

## 2024-09-19 MED ORDER — ONDANSETRON HCL 4 MG/2ML IJ SOLN
4.0000 mg | Freq: Four times a day (QID) | INTRAMUSCULAR | Status: DC | PRN
  Administered 2024-09-19 – 2024-09-20 (×3): 4 mg via INTRAVENOUS
  Filled 2024-09-19 (×3): qty 2

## 2024-09-19 MED ORDER — SODIUM CHLORIDE 0.9 % IV SOLN: INTRAVENOUS | Status: DC

## 2024-09-19 NOTE — Evaluation (Signed)
 Occupational Therapy Evaluation Patient Details Name: Elizabeth Lamb MRN: [id number] DOB: [birth year] Today's Date: 09/19/2024   History of Present Illness   The pt is a 68 yr old  female admitted with vomiting, diarrhea, nausea and after being found down on the floor. She was found to have acute encephalopathy, hypotension, dehydration, gastroenteritis, rhabdomyolysis, AKI. PMH: adrenal insufficiency, hypothyroidism     Clinical Impressions During the session today, the pt completed all assessed tasks independently, including supine to sit, donning her socks seated EOB, sit to stand, and performing hand washing standing at the sink. She also reported getting up to the bathroom for toileting management independently. She appears to be at or near to her baseline level of functioning for the management of ADLs. She does not require further OT services. OT will sign off and recommend she return home at discharge.      If plan is discharge home, recommend the following:   Assistance with cooking/housework;Help with stairs or ramp for entrance     Functional Status Assessment   Patient has not had a recent decline in their functional status     Equipment Recommendations   None recommended by OT     Recommendations for Other Services         Precautions/Restrictions   Restrictions Weight Bearing Restrictions Per Provider Order: No     Mobility Bed Mobility Overal bed mobility: Independent Bed Mobility: Supine to Sit, Sit to Supine                Transfers Overall transfer level: Independent Equipment used: None Transfers: Sit to/from Stand Sit to Stand: Independent                  Balance     Sitting balance-Leahy Scale: Good       Standing balance-Leahy Scale: Good         ADL either performed or assessed with clinical judgement   ADL Overall ADL's : Independent;At baseline      General ADL Comments: The pt performed all tasks  independently, including donning her socks seated EOB and performing hand washing standing at the sink. She also reported going back and forth to the bathroom mutiple times for toileting today.     Vision   Additional Comments: She correctly read the time depicted on the wall clock.            Pertinent Vitals/Pain Pain Assessment Pain Assessment: No/denies pain     Extremity/Trunk Assessment Upper Extremity Assessment Upper Extremity Assessment: Right hand dominant;RUE deficits/detail;LUE deficits/detail RUE Deficits / Details: AROM WFL. Grip strength 4+/5 LUE Deficits / Details: AROM WFL. Grip strength 4+/5   Lower Extremity Assessment Lower Extremity Assessment: RLE deficits/detail;LLE deficits/detail RLE Deficits / Details: AROM and strength WFL LLE Deficits / Details: AROM and strength WFL       Communication Communication Communication: No apparent difficulties   Cognition Arousal: Alert Behavior During Therapy: WFL for tasks assessed/performed Cognition: No apparent impairments             OT - Cognition Comments: Oriented x4        Following commands: Intact       Cueing  General Comments   Cueing Techniques: Verbal cues              Home Living Family/patient expects to be discharged to:: Private residence Living Arrangements: Spouse/significant other Available Help at Discharge: Family Type of Home: House Home Access: Stairs to enter [employer] of  Steps: 3 Entrance Stairs-Rails: Right;Left Home Layout: Two level;1/2 bath on main level Alternate Level Stairs-Number of Steps: her bedroom is upstairs             Home Equipment: None          Prior Functioning/Environment Prior Level of Function : Independent/Modified Independent;Driving             Mobility Comments: Independent with ambulation. ADLs Comments: Independent with ADLs, working, driving, and sharing household chores with her spouse.       OT  Treatment/Interventions:   N/A     OT Goals(Current goals can be found in the care plan section)   Acute Rehab OT Goals OT Goal Formulation: All assessment and education complete, DC therapy   OT Frequency:   N/A       AM-PAC OT 6 Clicks Daily Activity     Outcome Measure Help from another person eating meals?: None Help from another person taking care of personal grooming?: None Help from another person toileting, which includes using toliet, bedpan, or urinal?: None Help from another person bathing (including washing, rinsing, drying)?: None Help from another person to put on and taking off regular upper body clothing?: None Help from another person to put on and taking off regular lower body clothing?: None 6 Click Score: 24   End of Session Equipment Utilized During Treatment: Other (comment) (N/A) Nurse Communication: Mobility status  Activity Tolerance: Patient tolerated treatment well Patient left: in bed;with call bell/phone within reach;with family/visitor present  OT Visit Diagnosis: Muscle weakness (generalized) (M62.81)                Time: 8947-8876 OT Time Calculation (min): 31 min Charges:  OT General Charges $OT Visit: 1 Visit OT Evaluation $OT Eval Low Complexity: 1 Low    Delanna JINNY Lesches, OTR/L 09/19/2024, 2:16 PM

## 2024-09-19 NOTE — Progress Notes (Signed)
 " PROGRESS NOTE    Elizabeth Lamb  [name] DOB: Jan 27, 1956 DOA: 09/17/2024 PCP: Levora Reyes SAUNDERS, MD    Brief Narrative:  68 year old with hypothyroidism and secondary adrenal insufficiency on hydrocortisone  at home, multiple family members with norovirus gastroenteritis brought to the ER with nausea vomiting, diarrhea for almost 1 week.  At the middle of the night she was found to be confused, lying on the floor, feeling sick.  Brought to the ER. In the emergency room blood pressure stable.  On room air.  WBC 14.6, creatinine 1.54, magnesium  1.6.  Lactic acid 2.  CT head no acute findings.  CT chest abdomen pelvis no acute findings.  Admitted with altered mental status, severe dehydration.  Also found to have CK levels of 2500.  Subjective:  Patient seen and examined.  Husband at bedside.  Alert awake and oriented.  She told me she was able to go to bathroom by herself. 3 bowel movements overnight, now more formed. CK level still more than thousand.  Appropriately improving.  Electrolytes are improving.  Eager to go home.  Since CK is still more than thousand, will continue IV fluids today and anticipate home tomorrow.   Assessment & Plan:   Severe gastroenteritis likely viral gastroenteritis.  Family members with norovirus. Symptoms improving.  High risk of dehydration. Continue maintenance fluid.  Encourage oral intake.  If any loose stool, will check GI pathogen panel.  Otherwise this will be self-limiting. Started on cefepime  and Flagyl  with elevated procalcitonin, discontinue.  Acute metabolic encephalopathy, likely combination of dehydration, steroid insufficiency. CT head nonfocal. Mental status normalized.  AKI with moderate dehydration: As above.  Maintenance fluids.  Renal functions improved.  Hypomagnesemia: Replaced.  Adequate.  Hypophosphatemia: Replaced today.  Traumatic rhabdomyolysis: Likely due to laying on the floor.  Renal functions improving.  Will  repeat CK levels tomorrow.  Continue maintenance fluid.  Adrenal insufficiency: On Solu-Cortef  20/10 at home.  Patient was given a stress dose of steroids. Back on home dose now.    DVT prophylaxis: SCDs Start: 09/17/24 2137   Code Status: Full code Family Communication: Husband at the bedside Disposition Plan: Status is: Inpatient.  Remains inpatient, needs IV fluids.   Consultants:  None  Procedures:  None  Antimicrobials:  Cefepime  Flagyl  12/21--- 12/23     Objective: Vitals:   09/18/24 1303 09/18/24 2010 09/18/24 2043 09/19/24 0451  BP: (!) 109/57 (!) 98/51 (!) 106/59 107/60  Pulse: 86 81 79 76  Resp: 18     Temp: 97.7 F (36.5 C) 98.1 F (36.7 C)  97.8 F (36.6 C)  TempSrc: Oral Oral  Oral  SpO2: 98% 100%  98%  Weight:      Height:        Intake/Output Summary (Last 24 hours) at 09/19/2024 1400 Last data filed at 09/19/2024 0600 Gross per 24 hour  Intake 2094.4 ml  Output --  Net 2094.4 ml   Filed Weights   09/17/24 1856  Weight: 83.6 kg    Examination:  General exam: Alert awake and oriented.  Comfortable.  Mucous membranes are moist. Patient is alert awake and oriented.  No focal neurological deficits. Respiratory system: Clear to auscultation. Respiratory effort normal. Cardiovascular system: S1 & S2 heard, RRR. No JVD, murmurs, rubs, gallops or clicks. No pedal edema. Gastrointestinal system: Abdomen is nondistended, soft and nontender. No organomegaly or masses felt. Normal bowel sounds heard. Central nervous system: Alert and oriented. No focal neurological deficits.    Data Reviewed: I  have personally reviewed following labs and imaging studies  CBC: Recent Labs  Lab 09/17/24 1720 09/18/24 0329 09/19/24 0428  WBC 14.6* 11.0* 9.8  NEUTROABS 7.9*  --  5.4  HGB 13.4 12.2 9.6*  HCT 40.4 36.7 28.5*  MCV 87.4 87.0 85.6  PLT 165 164 144*   Basic Metabolic Panel: Recent Labs  Lab 09/17/24 1720 09/18/24 0329 09/19/24 0428  NA  142 141 141  K 3.5 4.5 3.6  CL 110 114* 113*  CO2 20* 12* 18*  GLUCOSE 89 104* 135*  BUN 31* 24* 18  CREATININE 1.54* 0.96 0.71  CALCIUM 8.5* 8.2* 7.7*  MG 1.6* 2.6* 2.4  PHOS  --   --  1.4*   GFR: Estimated Creatinine Clearance: 69.5 mL/min (by C-G formula based on SCr of 0.71 mg/dL). Liver Function Tests: Recent Labs  Lab 09/17/24 1720 09/18/24 0329 09/19/24 0428  AST 85* 91* 69*  ALT 40 41 37  ALKPHOS 51 49 43  BILITOT 0.5 0.4 0.5  PROT 6.0* 5.8* 5.3*  ALBUMIN 3.7 3.4* 3.1*   Recent Labs  Lab 09/17/24 1720  LIPASE 47   Recent Labs  Lab 09/17/24 2041  AMMONIA 30   Coagulation Profile: Recent Labs  Lab 09/17/24 1720  INR 1.1   Cardiac Enzymes: Recent Labs  Lab 09/17/24 2300 09/19/24 0428  CKTOTAL 2,563* 1,244*   BNP (last 3 results) No results for input(s): PROBNP in the last 8760 hours. HbA1C: No results for input(s): HGBA1C in the last 72 hours. CBG: No results for input(s): GLUCAP in the last 168 hours. Lipid Profile: No results for input(s): CHOL, HDL, LDLCALC, TRIG, CHOLHDL, LDLDIRECT in the last 72 hours. Thyroid  Function Tests: No results for input(s): TSH, T4TOTAL, FREET4, T3FREE, THYROIDAB in the last 72 hours. Anemia Panel: Recent Labs    09/17/24 2041  VITAMINB12 499   Sepsis Labs: Recent Labs  Lab 09/17/24 1734 09/17/24 1936 09/17/24 2041 09/18/24 0329  PROCALCITON  --   --  1.67 1.19  LATICACIDVEN 2.0* 1.7  --   --     Recent Results (from the past 240 hours)  Urine Culture (for pregnant, neutropenic or urologic patients or patients with an indwelling urinary catheter)     Status: Abnormal   Collection Time: 09/17/24 12:26 AM   Specimen: Urine, Clean Catch  Result Value Ref Range Status   Specimen Description   Final    URINE, CLEAN CATCH Performed at [facility], [address] [place], [place] [address]    Special Requests   Final    NONE Performed at [facility], [address] [place], [place] [address]    Culture (A)  Final    <10,000 COLONIES/mL INSIGNIFICANT GROWTH Performed at [facility], [address] [place], [place] [address]    Report Status 09/19/2024 FINAL  Final  Culture, blood (routine x 2)     Status: None (Preliminary result)   Collection Time: 09/17/24  5:20 PM   Specimen: BLOOD RIGHT WRIST  Result Value Ref Range Status   Specimen Description   Final    BLOOD RIGHT WRIST Performed at [facility], [address], [place], [place] [address]    Special Requests   Final    BOTTLES DRAWN AEROBIC AND ANAEROBIC Blood Culture results may not be optimal due to an inadequate volume of blood received in culture bottles Performed at [facility], [address] [place], [place] [address]  Culture   Final    NO GROWTH 2 DAYS Performed at [facility], [address], [place], [place] [address]    Report Status PENDING  Incomplete  Culture, blood (routine x 2)     Status: None (Preliminary result)   Collection Time: 09/17/24  5:30 PM   Specimen: BLOOD LEFT HAND  Result Value Ref Range Status   Specimen Description   Final    BLOOD LEFT HAND Performed at [facility], [address] [place], [place] [address]    Special Requests   Final    BOTTLES DRAWN AEROBIC ONLY Blood Culture results may not be optimal due to an inadequate volume of blood received in culture bottles Performed at [facility], [address], [place], [place] [address]    Culture   Final    NO GROWTH 2 DAYS Performed at [facility], [address], [place], [place] [address]    Report Status PENDING  Incomplete  Resp panel by RT-PCR (RSV, Flu A&B, Covid) Anterior Nasal Swab     Status: None   Collection Time: 09/17/24  5:46 PM   Specimen: Anterior Nasal Swab  Result Value Ref Range Status   SARS Coronavirus 2 by RT PCR NEGATIVE NEGATIVE Final    Comment:  (NOTE) SARS-CoV-2 target nucleic acids are NOT DETECTED.  The SARS-CoV-2 RNA is generally detectable in upper respiratory specimens during the acute phase of infection. The lowest concentration of SARS-CoV-2 viral copies this assay can detect is 138 copies/mL. A negative result does not preclude SARS-Cov-2 infection and should not be used as the sole basis for treatment or other patient management decisions. A negative result may occur with  improper specimen collection/handling, submission of specimen other than nasopharyngeal swab, presence of viral mutation(s) within the areas targeted by this assay, and inadequate number of viral copies(<138 copies/mL). A negative result must be combined with clinical observations, patient history, and epidemiological information. The expected result is Negative.  Fact Sheet for Patients:  bloggercourse.com  Fact Sheet for Healthcare Providers:  seriousbroker.it  This test is no t yet approved or cleared by the [place]  FDA and  has been authorized for detection and/or diagnosis of SARS-CoV-2 by FDA under an Emergency Use Authorization (EUA). This EUA will remain  in effect (meaning this test can be used) for the duration of the COVID-19 declaration under Section 564(b)(1) of the Act, 21 U.S.C.section 360bbb-3(b)(1), unless the authorization is terminated  or revoked sooner.       Influenza A by PCR NEGATIVE NEGATIVE Final   Influenza B by PCR NEGATIVE NEGATIVE Final    Comment: (NOTE) The Xpert Xpress SARS-CoV-2/FLU/RSV plus assay is intended as an aid in the diagnosis of influenza from Nasopharyngeal swab specimens and should not be used as a sole basis for treatment. Nasal washings and aspirates are unacceptable for Xpert Xpress SARS-CoV-2/FLU/RSV testing.  Fact Sheet for Patients: bloggercourse.com  Fact Sheet for Healthcare  Providers: seriousbroker.it  This test is not yet approved or cleared by the [place]  FDA and has been authorized for detection and/or diagnosis of SARS-CoV-2 by FDA under an Emergency Use Authorization (EUA). This EUA will remain in effect (meaning this test can be used) for the duration of the COVID-19 declaration under Section 564(b)(1) of the Act, 21 U.S.C. section 360bbb-3(b)(1), unless the authorization is terminated or revoked.     Resp Syncytial Virus by PCR NEGATIVE NEGATIVE Final    Comment: (NOTE) Fact  Sheet for Patients: bloggercourse.com  Fact Sheet for Healthcare Providers: seriousbroker.it  This test is not yet approved or cleared by the [place]  FDA and has been authorized for detection and/or diagnosis of SARS-CoV-2 by FDA under an Emergency Use Authorization (EUA). This EUA will remain in effect (meaning this test can be used) for the duration of the COVID-19 declaration under Section 564(b)(1) of the Act, 21 U.S.C. section 360bbb-3(b)(1), unless the authorization is terminated or revoked.  Performed at [facility], [address] [place], [place] [address]          Radiology Studies: CT CHEST ABDOMEN PELVIS W CONTRAST Addendum Date: 09/17/2024 ADDENDUM REPORT: 09/17/2024 23:14 ADDENDUM: There is a typographical error in the body of the report in the lungs section: The last sentence should read: No pneumothorax is noted. Electronically Signed   By: Oneil Devonshire M.D.   On: 09/17/2024 23:14   Result Date: 09/17/2024 CLINICAL DATA:  Clinical history is possible sepsis, nausea and vomiting for 2 days. EXAM: CT CHEST, ABDOMEN, AND PELVIS WITH CONTRAST TECHNIQUE: Multidetector CT imaging of the chest, abdomen and pelvis was performed following the standard protocol during bolus administration of intravenous contrast. RADIATION DOSE REDUCTION: This exam was  performed according to the departmental dose-optimization program which includes automated exposure control, adjustment of the mA and/or kV according to patient size and/or use of iterative reconstruction technique. CONTRAST:  OMNIPAQUE  IOHEXOL  300 MG/ML  SOLN COMPARISON:  05/07/2008 FINDINGS: CT CHEST FINDINGS Cardiovascular: Atherosclerotic calcifications of the thoracic aorta are noted. No cardiac enlargement is seen. No aneurysmal dilatation or dissection of the aorta is noted. The pulmonary artery shows no large central pulmonary embolus. Timing was not performed for embolus evaluation. Mediastinum/Nodes: Thoracic inlet is within normal limits. The esophagus is unremarkable. No hilar or mediastinal adenopathy is noted. Lungs/Pleura: Biapical pleuroparenchymal scarring is noted. Lungs are well aerated bilaterally. Some subpleural nodularity is noted in the right upper lobe best seen on image number 127 of series 6. No sizable parenchymal nodules are noted. No pleural effusion is seen. Pneumothorax is noted. Musculoskeletal: Mild degenerative changes of the thoracic spine are seen. CT ABDOMEN PELVIS FINDINGS Hepatobiliary: Gallbladder has been surgically removed. Liver is within normal limits. Pancreas: Unremarkable. No pancreatic ductal dilatation or surrounding inflammatory changes. Spleen: Normal in size without focal abnormality. Adrenals/Urinary Tract: Adrenal glands are within normal limits. Kidneys demonstrate a normal enhancement pattern bilaterally. Normal excretion is seen bilaterally. No renal calculi are noted. No obstructive changes are seen. The bladder is decompressed. Stomach/Bowel: The appendix is within normal limits. No obstructive or inflammatory changes of the colon are seen. The stomach and small bowel are within normal limits. Vascular/Lymphatic: Aortic atherosclerosis. No enlarged abdominal or pelvic lymph nodes. Reproductive: Status post hysterectomy. No adnexal masses. Other: No  abdominal wall hernia or abnormality. No abdominopelvic ascites. Musculoskeletal: No acute or significant osseous findings. IMPRESSION: CT of the chest: Scattered small subpleural nodules. No follow-up needed if patient is low-risk (and has no known or suspected primary neoplasm). Non-contrast chest CT can be considered in 12 months if patient is high-risk. This recommendation follows the consensus statement: Guidelines for Management of Incidental Pulmonary Nodules Detected on CT Images: From the Fleischner Society 2017; Radiology 2017; 284:228-243. No other focal abnormality is noted. CT of the abdomen and pelvis: No acute abnormality noted. Electronically Signed: By: Oneil Devonshire M.D. On: 09/17/2024 19:23   CT Head Wo Contrast Result Date: 09/17/2024 CLINICAL DATA:  Altered mental status and nausea  and vomiting EXAM: CT HEAD WITHOUT CONTRAST TECHNIQUE: Contiguous axial images were obtained from the base of the skull through the vertex without intravenous contrast. RADIATION DOSE REDUCTION: This exam was performed according to the departmental dose-optimization program which includes automated exposure control, adjustment of the mA and/or kV according to patient size and/or use of iterative reconstruction technique. COMPARISON:  07/29/2008 FINDINGS: Brain: No evidence of acute infarction, hemorrhage, hydrocephalus, extra-axial collection or mass lesion/mass effect. Vascular: No hyperdense vessel or unexpected calcification. Skull: Normal. Negative for fracture or focal lesion. Sinuses/Orbits: No acute finding. Other: None. IMPRESSION: No acute intracranial abnormality noted. Electronically Signed   By: Oneil Devonshire M.D.   On: 09/17/2024 19:27        Scheduled Meds:  hydrocortisone   10 mg Oral Daily   hydrocortisone   20 mg Oral Daily   hydrocortisone  cream   Topical BID   levothyroxine   25 mcg Oral QAC breakfast   sodium bicarbonate   650 mg Oral BID   Continuous Infusions:  sodium chloride  100  mL/hr at 09/19/24 1355   potassium PHOSPHATE  IVPB (in mmol) 30 mmol (09/19/24 0859)     LOS: 1 day      Lorry Anastasi, MD [facility]   "

## 2024-09-20 ENCOUNTER — Other Ambulatory Visit (HOSPITAL_COMMUNITY): Payer: Self-pay

## 2024-09-20 DIAGNOSIS — G9340 Encephalopathy, unspecified: Secondary | ICD-10-CM | POA: Diagnosis not present

## 2024-09-20 LAB — GASTROINTESTINAL PANEL BY PCR, STOOL (REPLACES STOOL CULTURE)

## 2024-09-20 LAB — COMPREHENSIVE METABOLIC PANEL WITH GFR
ALT: 41 U/L (ref 0–44)
AST: 59 U/L — ABNORMAL HIGH (ref 15–41)
Albumin: 3.3 g/dL — ABNORMAL LOW (ref 3.5–5.0)
Alkaline Phosphatase: 39 U/L (ref 38–126)
Anion gap: 7 (ref 5–15)
BUN: 8 mg/dL (ref 8–23)
CO2: 23 mmol/L (ref 22–32)
Calcium: 7.7 mg/dL — ABNORMAL LOW (ref 8.9–10.3)
Chloride: 112 mmol/L — ABNORMAL HIGH (ref 98–111)
Creatinine, Ser: 0.51 mg/dL (ref 0.44–1.00)
GFR, Estimated: >60 mL/min
Glucose, Bld: 94 mg/dL (ref 70–99)
Potassium: 3.2 mmol/L — ABNORMAL LOW (ref 3.5–5.1)
Sodium: 142 mmol/L (ref 135–145)
Total Bilirubin: 0.4 mg/dL (ref 0.0–1.2)
Total Protein: 5.2 g/dL — ABNORMAL LOW (ref 6.5–8.1)

## 2024-09-20 LAB — CBC
HCT: 30.7 % — ABNORMAL LOW (ref 36.0–46.0)
Hemoglobin: 10.3 g/dL — ABNORMAL LOW (ref 12.0–15.0)
MCH: 28.8 pg (ref 26.0–34.0)
MCHC: 33.6 g/dL (ref 30.0–36.0)
MCV: 85.8 fL (ref 80.0–100.0)
Platelets: 146 K/uL — ABNORMAL LOW (ref 150–400)
RBC: 3.58 MIL/uL — ABNORMAL LOW (ref 3.87–5.11)
RDW: 13.4 % (ref 11.5–15.5)
WBC: 9.2 K/uL (ref 4.0–10.5)
nRBC: 0 % (ref 0.0–0.2)

## 2024-09-20 LAB — CK: Total CK: 713 U/L — ABNORMAL HIGH (ref 38–234)

## 2024-09-20 LAB — MAGNESIUM: Magnesium: 1.9 mg/dL (ref 1.7–2.4)

## 2024-09-20 MED ORDER — VALACYCLOVIR HCL 1 G PO TABS
2000.0000 mg | ORAL_TABLET | Freq: Once | ORAL | 0 refills | Status: AC
  Filled 2024-09-20: qty 2, 1d supply, fill #0

## 2024-09-20 MED ORDER — ONDANSETRON 4 MG PO TBDP
4.0000 mg | ORAL_TABLET | Freq: Three times a day (TID) | ORAL | 0 refills | Status: AC | PRN
  Filled 2024-09-20: qty 20, 7d supply, fill #0

## 2024-09-20 MED ORDER — POTASSIUM CHLORIDE CRYS ER 20 MEQ PO TBCR
60.0000 meq | EXTENDED_RELEASE_TABLET | Freq: Once | ORAL | Status: AC
  Administered 2024-09-20: 60 meq via ORAL
  Filled 2024-09-20: qty 3

## 2024-09-20 MED ORDER — VALACYCLOVIR HCL 500 MG PO TABS
2000.0000 mg | ORAL_TABLET | Freq: Two times a day (BID) | ORAL | Status: DC
  Administered 2024-09-20: 2000 mg via ORAL
  Filled 2024-09-20: qty 4

## 2024-09-20 NOTE — Discharge Instructions (Signed)
 SABRA

## 2024-09-20 NOTE — Progress Notes (Signed)
 Pt refused morning synthroid  dose. Pt stated she is feeling nauseous at the moment and would not like to take any medicine. RN suggested Zofran  to help with nausea. Pt refused and stated I want to feel it out before I take anything. RN attempted to provide education, patient declined further conversation.

## 2024-09-20 NOTE — TOC Progression Note (Addendum)
 Transition of Care [facility]) - Progression Note    Patient Details  Name: Elizabeth Lamb MRN: [id number] Date of Birth: 1955-12-19  [facility] of [facility] CM/SW Contact  Doneta Glenys DASEN, RN Phone Number: 09/20/2024, 9:43 AM  Clinical Narrative:    12:19 PM Patient politely refused HH PT. CM signing off 9:43 am Choice List presented. Home Medical Care      Service Provider Request Status  [facility])  Accepted  [facility])  Accepted  [facility])  Accepted  [facility] Accept[facility]       Barriers to Discharge: Continued Medical Work up               Expected Discharge Plan and Services                                               Social Drivers of Health (SDOH) Interventions SDOH Screenings   Food Insecurity: No Food Insecurity (09/17/2024)  Housing: Low Risk (09/17/2024)  Transportation Needs: No Transportation Needs (09/17/2024)  Utilities: Not At Risk (09/17/2024)  Alcohol Screen: Low Risk (08/19/2023)  Depression (PHQ2-9): Low Risk (02/28/2024)  Financial Resource Strain: Low Risk (02/26/2024)  Physical Activity: Insufficiently Active (02/26/2024)  Social Connections: Unknown (09/17/2024)  Stress: No Stress Concern Present (02/26/2024)  Tobacco Use: Medium Risk (09/17/2024)  Health Literacy: Adequate Health Literacy (08/19/2023)    Readmission Risk Interventions     No data to display

## 2024-09-20 NOTE — Progress Notes (Signed)
 PT Cancellation Note  Patient Details Name: Elizabeth Lamb MRN: [id number] DOB: 1955-11-24   Cancelled Treatment:    Reason Eval/Treat Not Completed: PT screened, no needs identified, will sign off. Spoke brefly with patient and her husband-both deny any further need for PT services. They both deny any needs for DME or f/u PT after discharge. Will update recommendations and sign off.    Dannial SQUIBB, PT Acute Rehabilitation  Office: [phone]

## 2024-09-20 NOTE — TOC Transition Note (Signed)
 Transition of Care [facility]) - Discharge Note   Patient Details  Name: LAINEE LEHRMAN MRN: [id number] Date of Birth: 03-02-1956  Transition of [facility]) CM/SW Contact:  Doneta Glenys DASEN, RN Phone Number: 09/20/2024, 12:24 PM   Clinical Narrative:    Patient is discharging home with spouse. Patient politely refused HH. Spouse to transport home at discharge. CM signing off   Final next level of care: Home/Self Care Barriers to Discharge: Barriers Resolved   Patient Goals and CMS Choice Patient states their goals for this hospitalization and ongoing recovery are:: Home CMS Medicare.gov Compare Post Acute Care list provided to:: Patient Choice offered to / list presented to : Patient  ownership interest in [facility].provided to:: Patient    Discharge Placement                       Discharge Plan and Services Additional resources added to the After Visit Summary for   In-house Referral: NA Discharge Planning Services: CM Consult Post Acute Care Choice: NA          DME Arranged: N/A DME Agency: NA       HH Arranged: Patient Refused HH          Social Drivers of Health (SDOH) Interventions SDOH Screenings   Food Insecurity: No Food Insecurity (09/17/2024)  Housing: Low Risk (09/17/2024)  Transportation Needs: No Transportation Needs (09/17/2024)  Utilities: Not At Risk (09/17/2024)  Alcohol Screen: Low Risk (08/19/2023)  Depression (PHQ2-9): Low Risk (02/28/2024)  Financial Resource Strain: Low Risk (02/26/2024)  Physical Activity: Insufficiently Active (02/26/2024)  Social Connections: Unknown (09/17/2024)  Stress: No Stress Concern Present (02/26/2024)  Tobacco Use: Medium Risk (09/17/2024)  Health Literacy: Adequate Health Literacy (08/19/2023)     Readmission Risk Interventions     No data to display

## 2024-09-21 NOTE — Discharge Summary (Incomplete)
 "  Physician Discharge Summary  Elizabeth Lamb [name] DOB: May 29, 1956 DOA: 09/17/2024  PCP: Elizabeth Reyes SAUNDERS, MD  Admit date: 09/17/2024 Discharge date: 09/20/2024  Admitted From: Home Disposition:  *** Recommendations for Outpatient Follow-up:  Outpatient follow-up with*** Check *** Please follow up on the following pending results: ***  Home Health: *** Equipment/Devices: ***  Discharge Condition: *** CODE STATUS: ***   Follow-up Information     Elizabeth Reyes SAUNDERS, MD. Schedule an appointment as soon as possible for a visit in 1 week(s).   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  At the middle of the night she was found to be confused, lying on the floor, feeling sick.  Brought to the ER. In the emergency room blood pressure stable.  On room air.  WBC 14.6, creatinine 1.54, magnesium  1.6.  Lactic acid 2.  CT head no acute findings.  CT chest abdomen pelvis no acute findings.  Admitted with altered mental status, severe dehydration.  Also found to have CK levels of 2500.  See individual problem list below for more.   Problems addressed during this hospitalization Severe gastroenteritis likely viral gastroenteritis.  Family members with norovirus. Symptoms improving.  High risk of dehydration. Continue maintenance fluid.  Encourage oral intake.  If any loose stool, will check GI pathogen panel.  Otherwise this will be self-limiting. Started on cefepime  and Flagyl  with elevated procalcitonin, discontinue.   Acute metabolic encephalopathy, likely combination of dehydration, steroid insufficiency. CT head nonfocal. Mental status  normalized.   AKI with moderate dehydration: As above.  Maintenance fluids.  Renal functions improved.   Hypomagnesemia: Replaced.  Adequate.   Hypophosphatemia: Replaced today.   Traumatic rhabdomyolysis: Likely due to laying on the floor.  Renal functions improving.  Will repeat CK levels tomorrow.  Continue maintenance fluid.   Adrenal insufficiency: On Solu-Cortef  20/10 at home.  Patient was given a stress dose of steroids. Back on home dose now.    Body mass index is 31.64 kg/m.           Consultations: ***  Time spent 35  minutes  Vital signs Vitals:   09/19/24 0451 09/19/24 1412 09/19/24 2113 09/20/24 0618  BP: 107/60 (!) 112/57 (!) 111/58 (!) 128/57  Pulse: 76 72 71 75  Temp: 97.8 F (36.6 C) 98 F (36.7 C) 98.2 F (36.8 C) 97.9 F (36.6 C)  Resp:  18 16 20   Height:      Weight:      SpO2: 98% 97% 98% 95%  TempSrc: Oral Oral Oral Oral  BMI (Calculated):         Discharge exam  GENERAL: No apparent distress.  Nontoxic. HEENT: MMM.  Vision and hearing grossly intact.  NECK: Supple.  No apparent JVD.  RESP:  No IWOB.  Fair aeration bilaterally. CVS:  RRR. Heart sounds normal.  ABD/GI/GU: BS+. Abd soft, NTND.  MSK/EXT:  Moves extremities. No apparent deformity. No edema.  SKIN: no apparent skin lesion or wound NEURO: Awake and alert. Oriented appropriately.  No apparent focal neuro deficit. PSYCH: Calm. Normal affect. ***  Discharge Instructions Discharge Instructions     Discharge instructions   Complete by:  As directed    It has been a pleasure taking care of you!  You were hospitalized due to nausea, vomiting and diarrhea likely from viral gastroenteritis.  You symptoms resolved.  Maintain good hydration.  You may use Zofran  as needed for nausea and vomiting.  Follow-up with your primary care doctor in 1 to 2 weeks or sooner if needed.   Take care,   Increase activity slowly   Complete by: As directed    No wound care   Complete by: As  directed       Allergies as of 09/20/2024       Reactions   Vibramycin [doxycycline Calcium] Rash        Medication List     STOP taking these medications    eletriptan  40 MG tablet Commonly known as: RELPAX    hydrOXYzine  10 MG tablet Commonly known as: ATARAX        TAKE these medications    hydrocortisone  20 MG tablet Commonly known as: CORTEF  Take 1 tablet in the morning and 1/2 tab in the afternoon and take additional / double the dose in case of illness. What changed:  how much to take how to take this when to take this additional instructions   levothyroxine  25 MCG tablet Commonly known as: SYNTHROID  TAKE 1 TABLET BY MOUTH DAILY BEFORE BREAKFAST   ondansetron  4 MG disintegrating tablet Commonly known as: ZOFRAN -ODT Take 1 tablet (4 mg total) by mouth every 8 (eight) hours as needed for nausea or vomiting.   valACYclovir  1000 MG tablet Commonly known as: VALTREX  Take 2 tablets (2,000 mg total) by mouth once for 1 dose. In the evening         Procedures/Studies: ***   CT CHEST ABDOMEN PELVIS W CONTRAST Addendum Date: 09/17/2024 ADDENDUM REPORT: 09/17/2024 23:14 ADDENDUM: There is a typographical error in the body of the report in the lungs section: The last sentence should read: No pneumothorax is noted. Electronically Signed   By: Oneil Devonshire M.D.   On: 09/17/2024 23:14   Result Date: 09/17/2024 CLINICAL DATA:  Clinical history is possible sepsis, nausea and vomiting for 2 days. EXAM: CT CHEST, ABDOMEN, AND PELVIS WITH CONTRAST TECHNIQUE: Multidetector CT imaging of the chest, abdomen and pelvis was performed following the standard protocol during bolus administration of intravenous contrast. RADIATION DOSE REDUCTION: This exam was performed according to the departmental dose-optimization program which includes automated exposure control, adjustment of the mA and/or kV according to patient size and/or use of iterative reconstruction technique.  CONTRAST:  OMNIPAQUE  IOHEXOL  300 MG/ML  SOLN COMPARISON:  05/07/2008 FINDINGS: CT CHEST FINDINGS Cardiovascular: Atherosclerotic calcifications of the thoracic aorta are noted. No cardiac enlargement is seen. No aneurysmal dilatation or dissection of the aorta is noted. The pulmonary artery shows no large central pulmonary embolus. Timing was not performed for embolus evaluation. Mediastinum/Nodes: Thoracic inlet is within normal limits. The esophagus is unremarkable. No hilar or mediastinal adenopathy is noted. Lungs/Pleura: Biapical pleuroparenchymal scarring is noted. Lungs are well aerated bilaterally. Some subpleural nodularity is noted in the right upper lobe best seen on image number 127 of series 6. No sizable parenchymal nodules are noted. No pleural effusion is seen. Pneumothorax is noted. Musculoskeletal: Mild degenerative changes of the thoracic spine are seen. CT ABDOMEN PELVIS FINDINGS Hepatobiliary: Gallbladder has been surgically removed. Liver is within normal limits. Pancreas: Unremarkable. No pancreatic ductal dilatation or surrounding inflammatory changes. Spleen: Normal in size without focal abnormality. Adrenals/Urinary Tract: Adrenal glands are within normal limits.  Kidneys demonstrate a normal enhancement pattern bilaterally. Normal excretion is seen bilaterally. No renal calculi are noted. No obstructive changes are seen. The bladder is decompressed. Stomach/Bowel: The appendix is within normal limits. No obstructive or inflammatory changes of the colon are seen. The stomach and small bowel are within normal limits. Vascular/Lymphatic: Aortic atherosclerosis. No enlarged abdominal or pelvic lymph nodes. Reproductive: Status post hysterectomy. No adnexal masses. Other: No abdominal wall hernia or abnormality. No abdominopelvic ascites. Musculoskeletal: No acute or significant osseous findings. IMPRESSION: CT of the chest: Scattered small subpleural nodules. No follow-up needed if  patient is low-risk (and has no known or suspected primary neoplasm). Non-contrast chest CT can be considered in 12 months if patient is high-risk. This recommendation follows the consensus statement: Guidelines for Management of Incidental Pulmonary Nodules Detected on CT Images: From the Fleischner Society 2017; Radiology 2017; 284:228-243. No other focal abnormality is noted. CT of the abdomen and pelvis: No acute abnormality noted. Electronically Signed: By: Oneil Devonshire M.D. On: 09/17/2024 19:23   CT Head Wo Contrast Result Date: 09/17/2024 CLINICAL DATA:  Altered mental status and nausea and vomiting EXAM: CT HEAD WITHOUT CONTRAST TECHNIQUE: Contiguous axial images were obtained from the base of the skull through the vertex without intravenous contrast. RADIATION DOSE REDUCTION: This exam was performed according to the departmental dose-optimization program which includes automated exposure control, adjustment of the mA and/or kV according to patient size and/or use of iterative reconstruction technique. COMPARISON:  07/29/2008 FINDINGS: Brain: No evidence of acute infarction, hemorrhage, hydrocephalus, extra-axial collection or mass lesion/mass effect. Vascular: No hyperdense vessel or unexpected calcification. Skull: Normal. Negative for fracture or focal lesion. Sinuses/Orbits: No acute finding. Other: None. IMPRESSION: No acute intracranial abnormality noted. Electronically Signed   By: Oneil Devonshire M.D.   On: 09/17/2024 19:27       The results of significant diagnostics from this hospitalization (including imaging, microbiology, ancillary and laboratory) are listed below for reference.     Microbiology: Recent Results (from the past 240 hours)  Urine Culture (for pregnant, neutropenic or urologic patients or patients with an indwelling urinary catheter)     Status: Abnormal   Collection Time: 09/17/24 12:26 AM   Specimen: Urine, Clean Catch  Result Value Ref Range Status   Specimen  Description   Final    URINE, CLEAN CATCH Performed at [facility], [address] [place], [place] [address]    Special Requests   Final    NONE Performed at [facility], [address] [place], [place] [address]    Culture (A)  Final    <10,000 COLONIES/mL INSIGNIFICANT GROWTH Performed at [facility], [address] [place], [place] [address]    Report Status 09/19/2024 FINAL  Final  Culture, blood (routine x 2)     Status: None (Preliminary result)   Collection Time: 09/17/24  5:20 PM   Specimen: BLOOD RIGHT WRIST  Result Value Ref Range Status   Specimen Description   Final    BLOOD RIGHT WRIST Performed at [facility], [address], [place], [place] [address]    Special Requests   Final    BOTTLES DRAWN AEROBIC AND ANAEROBIC Blood Culture results may not be optimal due to an inadequate volume of blood received in culture bottles Performed at [facility], [address] [place], [place] [address]    Culture   Final    NO GROWTH 3 DAYS Performed at [facility], [address] [place], [place] [address]    Report Status PENDING  Incomplete  Culture, blood (routine x 2)     Status: None (Preliminary result)   Collection Time: 09/17/24  5:30 PM   Specimen: BLOOD LEFT HAND  Result Value Ref Range Status   Specimen Description   Final    BLOOD LEFT HAND Performed at [facility], [address], [place], [place] [address]    Special Requests   Final    BOTTLES DRAWN AEROBIC ONLY Blood Culture results may not be optimal due to an inadequate volume of blood received in culture bottles Performed at [facility], [address] [place], [place] [address]    Culture   Final    NO GROWTH 3 DAYS Performed at [facility], [address] [place], [place] [address]    Report Status PENDING  Incomplete  Resp panel by RT-PCR (RSV, Flu A&B, Covid) Anterior Nasal  Swab     Status: None   Collection Time: 09/17/24  5:46 PM   Specimen: Anterior Nasal Swab  Result Value Ref Range Status   SARS Coronavirus 2 by RT PCR NEGATIVE NEGATIVE Final    Comment: (NOTE) SARS-CoV-2 target nucleic acids are NOT DETECTED.  The SARS-CoV-2 RNA is generally detectable in upper respiratory specimens during the acute phase of infection. The lowest concentration of SARS-CoV-2 viral copies this assay can detect is 138 copies/mL. A negative result does not preclude SARS-Cov-2 infection and should not be used as the sole basis for treatment or other patient management decisions. A negative result may occur with  improper specimen collection/handling, submission of specimen other than nasopharyngeal swab, presence of viral mutation(s) within the areas targeted by this assay, and inadequate number of viral copies(<138 copies/mL). A negative result must be combined with clinical observations, patient history, and epidemiological information. The expected result is Negative.  Fact Sheet for Patients:  bloggercourse.com  Fact Sheet for Healthcare Providers:  seriousbroker.it  This test is no t yet approved or cleared by the [place]  FDA and  has been authorized for detection and/or diagnosis of SARS-CoV-2 by FDA under an Emergency Use Authorization (EUA). This EUA will remain  in effect (meaning this test can be used) for the duration of the COVID-19 declaration under Section 564(b)(1) of the Act, 21 U.S.C.section 360bbb-3(b)(1), unless the authorization is terminated  or revoked sooner.       Influenza A by PCR NEGATIVE NEGATIVE Final   Influenza B by PCR NEGATIVE NEGATIVE Final    Comment: (NOTE) The Xpert Xpress SARS-CoV-2/FLU/RSV plus assay is intended as an aid in the diagnosis of influenza from Nasopharyngeal swab specimens and should not be used as a sole basis for treatment. Nasal washings and aspirates  are unacceptable for Xpert Xpress SARS-CoV-2/FLU/RSV testing.  Fact Sheet for Patients: bloggercourse.com  Fact Sheet for Healthcare Providers: seriousbroker.it  This test is not yet approved or cleared by the [place]  FDA and has been authorized for detection and/or diagnosis of SARS-CoV-2 by FDA under an Emergency Use Authorization (EUA). This EUA will remain in effect (meaning this test can be used) for the duration of the COVID-19 declaration under Section 564(b)(1) of the Act, 21 U.S.C. section 360bbb-3(b)(1), unless the authorization is terminated or revoked.     Resp Syncytial Virus by PCR NEGATIVE NEGATIVE Final    Comment: (NOTE) Fact Sheet for Patients: bloggercourse.com  Fact Sheet for Healthcare Providers: seriousbroker.it  This test  is not yet approved or cleared by the [place]  FDA and has been authorized for detection and/or diagnosis of SARS-CoV-2 by FDA under an Emergency Use Authorization (EUA). This EUA will remain in effect (meaning this test can be used) for the duration of the COVID-19 declaration under Section 564(b)(1) of the Act, 21 U.S.C. section 360bbb-3(b)(1), unless the authorization is terminated or revoked.  Performed at [facility], [address], [place], [place] [address]   Gastrointestinal Panel by PCR , Stool     Status: Abnormal   Collection Time: 09/19/24  7:29 PM   Specimen: Stool  Result Value Ref Range Status   Campylobacter species NOT DETECTED NOT DETECTED Final   Plesimonas shigelloides NOT DETECTED NOT DETECTED Final   Salmonella species NOT DETECTED NOT DETECTED Final   Yersinia enterocolitica NOT DETECTED NOT DETECTED Final   Vibrio species NOT DETECTED NOT DETECTED Final   Vibrio cholerae NOT DETECTED NOT DETECTED Final   Enteroaggregative E coli (EAEC) NOT DETECTED NOT DETECTED Final    Enteropathogenic E coli (EPEC) NOT DETECTED NOT DETECTED Final   Enterotoxigenic E coli (ETEC) NOT DETECTED NOT DETECTED Final   Shiga like toxin producing E coli (STEC) NOT DETECTED NOT DETECTED Final   Shigella/Enteroinvasive E coli (EIEC) NOT DETECTED NOT DETECTED Final   Cryptosporidium NOT DETECTED NOT DETECTED Final   Cyclospora cayetanensis NOT DETECTED NOT DETECTED Final   Entamoeba histolytica NOT DETECTED NOT DETECTED Final   Giardia lamblia NOT DETECTED NOT DETECTED Final   Adenovirus F40/41 NOT DETECTED NOT DETECTED Final   Astrovirus NOT DETECTED NOT DETECTED Final   Norovirus GI/GII DETECTED (A) NOT DETECTED Final    Comment: RESULT CALLED TO, READ BACK BY AND VERIFIED WITH: CHIQUITA BAR RN AT 1116 ON 09/20/2024 BY KC    Rotavirus A NOT DETECTED NOT DETECTED Final   Sapovirus (I, II, IV, and V) NOT DETECTED NOT DETECTED Final    Comment: Performed at [facility], [address] [place], [place] [address]     Labs:  CBC: Recent Labs  Lab 09/17/24 1720 09/18/24 0329 09/19/24 0428 09/20/24 1002  WBC 14.6* 11.0* 9.8 9.2  NEUTROABS 7.9*  --  5.4  --   HGB 13.4 12.2 9.6* 10.3*  HCT 40.4 36.7 28.5* 30.7*  MCV 87.4 87.0 85.6 85.8  PLT 165 164 144* 146*   BMP &GFR Recent Labs  Lab 09/17/24 1720 09/18/24 0329 09/19/24 0428 09/20/24 1002  NA 142 141 141 142  K 3.5 4.5 3.6 3.2*  CL 110 114* 113* 112*  CO2 20* 12* 18* 23  GLUCOSE 89 104* 135* 94  BUN 31* 24* 18 8  CREATININE 1.54* 0.96 0.71 0.51  CALCIUM 8.5* 8.2* 7.7* 7.7*  MG 1.6* 2.6* 2.4 1.9  PHOS  --   --  1.4*  --    Estimated Creatinine Clearance: 69.5 mL/min (by C-G formula based on SCr of 0.51 mg/dL). Liver & Pancreas: Recent Labs  Lab 09/17/24 1720 09/18/24 0329 09/19/24 0428 09/20/24 1002  AST 85* 91* 69* 59*  ALT 40 41 37 41  ALKPHOS 51 49 43 39  BILITOT 0.5 0.4 0.5 0.4  PROT 6.0* 5.8* 5.3* 5.2*  ALBUMIN 3.7 3.4* 3.1* 3.3*   Recent Labs  Lab 09/17/24 1720  LIPASE 47    Recent Labs  Lab 09/17/24 2041  AMMONIA 30   Diabetic: No results for input(s): HGBA1C in the last 72 hours. No results for input(s): GLUCAP in the last 168 hours.  Cardiac Enzymes: Recent Labs  Lab 09/17/24 2300 09/19/24 0428 09/20/24 0438  CKTOTAL 2,563* 1,244* 713*   No results for input(s): PROBNP in the last 8760 hours. Coagulation Profile: Recent Labs  Lab 09/17/24 1720  INR 1.1   Thyroid  Function Tests: No results for input(s): TSH, T4TOTAL, FREET4, T3FREE, THYROIDAB in the last 72 hours. Lipid Profile: No results for input(s): CHOL, HDL, LDLCALC, TRIG, CHOLHDL, LDLDIRECT in the last 72 hours. Anemia Panel: No results for input(s): VITAMINB12, FOLATE, FERRITIN, TIBC, IRON, RETICCTPCT in the last 72 hours. Urine analysis:    Component Value Date/Time   COLORURINE YELLOW 09/17/2024 1836   APPEARANCEUR HAZY (A) 09/17/2024 1836   LABSPEC 1.017 09/17/2024 1836   PHURINE 5.0 09/17/2024 1836   GLUCOSEU NEGATIVE 09/17/2024 1836   HGBUR LARGE (A) 09/17/2024 1836   BILIRUBINUR NEGATIVE 09/17/2024 1836   KETONESUR NEGATIVE 09/17/2024 1836   PROTEINUR 100 (A) 09/17/2024 1836   UROBILINOGEN 0.2 07/30/2008 1052   NITRITE NEGATIVE 09/17/2024 1836   LEUKOCYTESUR MODERATE (A) 09/17/2024 1836   Sepsis Labs: Invalid input(s): PROCALCITONIN, LACTICIDVEN   SIGNED:  Mignon ONEIDA Bump, MD  [facility] 09/21/2024, 8:45 AM   "

## 2024-09-21 NOTE — Discharge Summary (Signed)
 "  Physician Discharge Summary  Elizabeth Lamb [name] DOB: 06-27-1956 DOA: 09/17/2024  PCP: Levora Reyes SAUNDERS, MD  Admit date: 09/17/2024 Discharge date: 09/20/2024  Admitted From: Home Disposition: Home Recommendations for Outpatient Follow-up:  Outpatient follow-up with PCP in 1 to 2 weeks. Check CMP and CBC at follow-up Please follow up on the following pending results: None  Home Health: No need identified. Equipment/Devices: No need identified.  Discharge Condition: Stable CODE STATUS: Full code   Follow-up Information     Levora Reyes SAUNDERS, MD. Schedule an appointment as soon as possible for a visit in 1 week(s).   Specialties: Family Medicine, Sports Medicine Contact information: [phone] [place] [address] [phone]                 Hospital course 68 year old F with PMH of adrenal insufficiency, hypothyroidism and herpes labialis presenting with nausea, vomiting and diarrhea for almost a week as well as acute confusion and illness, and admitted with severe gastroenteritis felt to be viral.  Exposure to multiple family members with recent norovirus infection.  In ED, stable vitals.  WBC 14.6.  Creatinine 1.54.  Mg 1.6.  Lactic acid 2.  CT head, chest, abdomen and pelvis without acute finding.  COVID-19, influenza and RSV PCR nonreactive.  CK elevated to 2500.  Blood culture and GIP ordered, and patient was admitted for sev viral gastroenteritis, encephalopathy, AKI and rhabdomyolysis.  The next day, encephalopathy and AKI resolved.  Symptoms including diarrhea improved.  On the day of discharge, patient felt well and ready to go home.  Gastrointestinal panel positive for norovirus.  Patient was advised to maintain good hydration.  Also advised on the importance of good hand hygiene to decrease transmission.  See individual problem list below for more.   Problems addressed during this hospitalization Severe viral gastroenteritis due  to norovirus infection-improved.  GIP panel positive for neuro.  Dehydration due to gastroenteritis Intractable nausea, vomiting and diarrhea-nausea and vomiting resolved.  Diarrhea improved. Acute metabolic encephalopathy-due to the above.  Resolved.  CT head without acute finding. Acute kidney injury-due to the above.  Resolved. Hypomagnesemia/hypophosphatemia-due to the above. Nontraumatic rhabdomyolysis-likely due to the above.  CK down from 2500-713.  Encourage good hydration. Adrenal insufficiency: Continue on steroid.  No exacerbation. Herpes labialis-has flare due to acute illness.  Received Valtrex  2 g x 1 in house.  Will take additional dose at home. Hypokalemia-replenished prior to discharge. Class I obesity Body mass index is 31.64 kg/m.           Consultations: None  Time spent 35  minutes  Vital signs Vitals:   09/19/24 0451 09/19/24 1412 09/19/24 2113 09/20/24 0618  BP: 107/60 (!) 112/57 (!) 111/58 (!) 128/57  Pulse: 76 72 71 75  Temp: 97.8 F (36.6 C) 98 F (36.7 C) 98.2 F (36.8 C) 97.9 F (36.6 C)  Resp:  18 16 20   Height:      Weight:      SpO2: 98% 97% 98% 95%  TempSrc: Oral Oral Oral Oral  BMI (Calculated):         Discharge exam  GENERAL: No apparent distress.  Nontoxic. HEENT: MMM.  Vision and hearing grossly intact.  NECK: Supple.  No apparent JVD.  RESP:  No IWOB.  Fair aeration bilaterally. CVS:  RRR. Heart sounds normal.  ABD/GI/GU: BS+. Abd soft, NTND.  MSK/EXT:  Moves extremities. No apparent deformity. No edema.  SKIN: no apparent skin lesion or wound  NEURO: Awake and alert. Oriented appropriately.  No apparent focal neuro deficit. PSYCH: Calm. Normal affect.   Discharge Instructions Discharge Instructions     Discharge instructions   Complete by: As directed    It has been a pleasure taking care of you!  You were hospitalized due to nausea, vomiting and diarrhea likely from viral gastroenteritis.  You symptoms resolved.   Maintain good hydration.  You may use Zofran  as needed for nausea and vomiting.  Follow-up with your primary care doctor in 1 to 2 weeks or sooner if needed.   Take care,   Increase activity slowly   Complete by: As directed    No wound care   Complete by: As directed       Allergies as of 09/20/2024       Reactions   Vibramycin [doxycycline Calcium] Rash        Medication List     STOP taking these medications    eletriptan  40 MG tablet Commonly known as: RELPAX    hydrOXYzine  10 MG tablet Commonly known as: ATARAX        TAKE these medications    hydrocortisone  20 MG tablet Commonly known as: CORTEF  Take 1 tablet in the morning and 1/2 tab in the afternoon and take additional / double the dose in case of illness. What changed:  how much to take how to take this when to take this additional instructions   levothyroxine  25 MCG tablet Commonly known as: SYNTHROID  TAKE 1 TABLET BY MOUTH DAILY BEFORE BREAKFAST   ondansetron  4 MG disintegrating tablet Commonly known as: ZOFRAN -ODT Take 1 tablet (4 mg total) by mouth every 8 (eight) hours as needed for nausea or vomiting.   valACYclovir  1000 MG tablet Commonly known as: VALTREX  Take 2 tablets (2,000 mg total) by mouth once for 1 dose. In the evening         Procedures/Studies:   CT CHEST ABDOMEN PELVIS W CONTRAST Addendum Date: 09/17/2024 ADDENDUM REPORT: 09/17/2024 23:14 ADDENDUM: There is a typographical error in the body of the report in the lungs section: The last sentence should read: No pneumothorax is noted. Electronically Signed   By: Oneil Devonshire M.D.   On: 09/17/2024 23:14   Result Date: 09/17/2024 CLINICAL DATA:  Clinical history is possible sepsis, nausea and vomiting for 2 days. EXAM: CT CHEST, ABDOMEN, AND PELVIS WITH CONTRAST TECHNIQUE: Multidetector CT imaging of the chest, abdomen and pelvis was performed following the standard protocol during bolus administration of intravenous contrast.  RADIATION DOSE REDUCTION: This exam was performed according to the departmental dose-optimization program which includes automated exposure control, adjustment of the mA and/or kV according to patient size and/or use of iterative reconstruction technique. CONTRAST:  OMNIPAQUE  IOHEXOL  300 MG/ML  SOLN COMPARISON:  05/07/2008 FINDINGS: CT CHEST FINDINGS Cardiovascular: Atherosclerotic calcifications of the thoracic aorta are noted. No cardiac enlargement is seen. No aneurysmal dilatation or dissection of the aorta is noted. The pulmonary artery shows no large central pulmonary embolus. Timing was not performed for embolus evaluation. Mediastinum/Nodes: Thoracic inlet is within normal limits. The esophagus is unremarkable. No hilar or mediastinal adenopathy is noted. Lungs/Pleura: Biapical pleuroparenchymal scarring is noted. Lungs are well aerated bilaterally. Some subpleural nodularity is noted in the right upper lobe best seen on image number 127 of series 6. No sizable parenchymal nodules are noted. No pleural effusion is seen. Pneumothorax is noted. Musculoskeletal: Mild degenerative changes of the thoracic spine are seen. CT ABDOMEN PELVIS FINDINGS Hepatobiliary: Gallbladder has been surgically  removed. Liver is within normal limits. Pancreas: Unremarkable. No pancreatic ductal dilatation or surrounding inflammatory changes. Spleen: Normal in size without focal abnormality. Adrenals/Urinary Tract: Adrenal glands are within normal limits. Kidneys demonstrate a normal enhancement pattern bilaterally. Normal excretion is seen bilaterally. No renal calculi are noted. No obstructive changes are seen. The bladder is decompressed. Stomach/Bowel: The appendix is within normal limits. No obstructive or inflammatory changes of the colon are seen. The stomach and small bowel are within normal limits. Vascular/Lymphatic: Aortic atherosclerosis. No enlarged abdominal or pelvic lymph nodes. Reproductive: Status post  hysterectomy. No adnexal masses. Other: No abdominal wall hernia or abnormality. No abdominopelvic ascites. Musculoskeletal: No acute or significant osseous findings. IMPRESSION: CT of the chest: Scattered small subpleural nodules. No follow-up needed if patient is low-risk (and has no known or suspected primary neoplasm). Non-contrast chest CT can be considered in 12 months if patient is high-risk. This recommendation follows the consensus statement: Guidelines for Management of Incidental Pulmonary Nodules Detected on CT Images: From the Fleischner Society 2017; Radiology 2017; 284:228-243. No other focal abnormality is noted. CT of the abdomen and pelvis: No acute abnormality noted. Electronically Signed: By: Oneil Devonshire M.D. On: 09/17/2024 19:23   CT Head Wo Contrast Result Date: 09/17/2024 CLINICAL DATA:  Altered mental status and nausea and vomiting EXAM: CT HEAD WITHOUT CONTRAST TECHNIQUE: Contiguous axial images were obtained from the base of the skull through the vertex without intravenous contrast. RADIATION DOSE REDUCTION: This exam was performed according to the departmental dose-optimization program which includes automated exposure control, adjustment of the mA and/or kV according to patient size and/or use of iterative reconstruction technique. COMPARISON:  07/29/2008 FINDINGS: Brain: No evidence of acute infarction, hemorrhage, hydrocephalus, extra-axial collection or mass lesion/mass effect. Vascular: No hyperdense vessel or unexpected calcification. Skull: Normal. Negative for fracture or focal lesion. Sinuses/Orbits: No acute finding. Other: None. IMPRESSION: No acute intracranial abnormality noted. Electronically Signed   By: Oneil Devonshire M.D.   On: 09/17/2024 19:27       The results of significant diagnostics from this hospitalization (including imaging, microbiology, ancillary and laboratory) are listed below for reference.     Microbiology: Recent Results (from the past 240  hours)  Urine Culture (for pregnant, neutropenic or urologic patients or patients with an indwelling urinary catheter)     Status: Abnormal   Collection Time: 09/17/24 12:26 AM   Specimen: Urine, Clean Catch  Result Value Ref Range Status   Specimen Description   Final    URINE, CLEAN CATCH Performed at [facility], [address] [place], [place] [address]    Special Requests   Final    NONE Performed at [facility], [address] [place], [place] [address]    Culture (A)  Final    <10,000 COLONIES/mL INSIGNIFICANT GROWTH Performed at [facility], [address], [place], [place] [address]    Report Status 09/19/2024 FINAL  Final  Culture, blood (routine x 2)     Status: None (Preliminary result)   Collection Time: 09/17/24  5:20 PM   Specimen: BLOOD RIGHT WRIST  Result Value Ref Range Status   Specimen Description   Final    BLOOD RIGHT WRIST Performed at [facility], [address], [place], [place] [address]    Special Requests   Final    BOTTLES DRAWN AEROBIC AND ANAEROBIC Blood Culture results may not be optimal due to an inadequate volume of blood received in culture  bottles Performed at [facility], [address], [place], [place] [address]    Culture   Final    NO GROWTH 4 DAYS Performed at [facility], [address], [place], [place] [address]    Report Status PENDING  Incomplete  Culture, blood (routine x 2)     Status: None (Preliminary result)   Collection Time: 09/17/24  5:30 PM   Specimen: BLOOD LEFT HAND  Result Value Ref Range Status   Specimen Description   Final    BLOOD LEFT HAND Performed at [facility], [address] [place], [place] [address]    Special Requests   Final    BOTTLES DRAWN AEROBIC ONLY Blood Culture results may not be optimal due to an inadequate volume of blood received in culture bottles Performed at [facility], [address], [place], [place] [address]    Culture   Final    NO GROWTH 4 DAYS Performed at [facility], [address], [place], [place] [address]    Report Status PENDING  Incomplete  Resp panel by RT-PCR (RSV, Flu A&B, Covid) Anterior Nasal Swab     Status: None   Collection Time: 09/17/24  5:46 PM   Specimen: Anterior Nasal Swab  Result Value Ref Range Status   SARS Coronavirus 2 by RT PCR NEGATIVE NEGATIVE Final    Comment: (NOTE) SARS-CoV-2 target nucleic acids are NOT DETECTED.  The SARS-CoV-2 RNA is generally detectable in upper respiratory specimens during the acute phase of infection. The lowest concentration of SARS-CoV-2 viral copies this assay can detect is 138 copies/mL. A negative result does not preclude SARS-Cov-2 infection and should not be used as the sole basis for treatment or other patient management decisions. A negative result may occur with  improper specimen collection/handling, submission of specimen other than nasopharyngeal swab, presence of viral mutation(s) within the areas targeted by this assay, and inadequate number of viral copies(<138 copies/mL). A negative result must be combined with clinical observations, patient history, and epidemiological information. The expected result is Negative.  Fact Sheet for Patients:  bloggercourse.com  Fact Sheet for Healthcare Providers:  seriousbroker.it  This test is no t yet approved or cleared by the [place]  FDA and  has been authorized for detection and/or diagnosis of SARS-CoV-2 by FDA under an Emergency Use Authorization (EUA). This EUA will remain  in effect (meaning this test can be used) for the duration of the COVID-19 declaration under Section 564(b)(1) of the Act, 21 U.S.C.section 360bbb-3(b)(1), unless the authorization is terminated  or revoked sooner.       Influenza A by PCR NEGATIVE NEGATIVE Final   Influenza B by PCR NEGATIVE  NEGATIVE Final    Comment: (NOTE) The Xpert Xpress SARS-CoV-2/FLU/RSV plus assay is intended as an aid in the diagnosis of influenza from Nasopharyngeal swab specimens and should not be used as a sole basis for treatment. Nasal washings and aspirates are unacceptable for Xpert Xpress SARS-CoV-2/FLU/RSV testing.  Fact Sheet for Patients: bloggercourse.com  Fact Sheet for Healthcare Providers: seriousbroker.it  This test is not yet approved or cleared by the [place]  FDA and has been authorized for detection and/or diagnosis of SARS-CoV-2 by FDA under an Emergency Use Authorization (EUA). This EUA will remain in effect (meaning this test can be used) for the duration of the COVID-19 declaration under Section 564(b)(1) of the Act, 21 U.S.C. section 360bbb-3(b)(1), unless the authorization is terminated or revoked.  Resp Syncytial Virus by PCR NEGATIVE NEGATIVE Final    Comment: (NOTE) Fact Sheet for Patients: bloggercourse.com  Fact Sheet for Healthcare Providers: seriousbroker.it  This test is not yet approved or cleared by the [place]  FDA and has been authorized for detection and/or diagnosis of SARS-CoV-2 by FDA under an Emergency Use Authorization (EUA). This EUA will remain in effect (meaning this test can be used) for the duration of the COVID-19 declaration under Section 564(b)(1) of the Act, 21 U.S.C. section 360bbb-3(b)(1), unless the authorization is terminated or revoked.  Performed at [facility], [address], [place], [place] [address]   Gastrointestinal Panel by PCR , Stool     Status: Abnormal   Collection Time: 09/19/24  7:29 PM   Specimen: Stool  Result Value Ref Range Status   Campylobacter species NOT DETECTED NOT DETECTED Final   Plesimonas shigelloides NOT DETECTED NOT DETECTED Final   Salmonella species NOT DETECTED  NOT DETECTED Final   Yersinia enterocolitica NOT DETECTED NOT DETECTED Final   Vibrio species NOT DETECTED NOT DETECTED Final   Vibrio cholerae NOT DETECTED NOT DETECTED Final   Enteroaggregative E coli (EAEC) NOT DETECTED NOT DETECTED Final   Enteropathogenic E coli (EPEC) NOT DETECTED NOT DETECTED Final   Enterotoxigenic E coli (ETEC) NOT DETECTED NOT DETECTED Final   Shiga like toxin producing E coli (STEC) NOT DETECTED NOT DETECTED Final   Shigella/Enteroinvasive E coli (EIEC) NOT DETECTED NOT DETECTED Final   Cryptosporidium NOT DETECTED NOT DETECTED Final   Cyclospora cayetanensis NOT DETECTED NOT DETECTED Final   Entamoeba histolytica NOT DETECTED NOT DETECTED Final   Giardia lamblia NOT DETECTED NOT DETECTED Final   Adenovirus F40/41 NOT DETECTED NOT DETECTED Final   Astrovirus NOT DETECTED NOT DETECTED Final   Norovirus GI/GII DETECTED (A) NOT DETECTED Final    Comment: RESULT CALLED TO, READ BACK BY AND VERIFIED WITH: CHIQUITA BAR RN AT 1116 ON 09/20/2024 BY KC    Rotavirus A NOT DETECTED NOT DETECTED Final   Sapovirus (I, II, IV, and V) NOT DETECTED NOT DETECTED Final    Comment: Performed at [facility], [address] [place], [place] [address]     Labs:  CBC: Recent Labs  Lab 09/17/24 1720 09/18/24 0329 09/19/24 0428 09/20/24 1002  WBC 14.6* 11.0* 9.8 9.2  NEUTROABS 7.9*  --  5.4  --   HGB 13.4 12.2 9.6* 10.3*  HCT 40.4 36.7 28.5* 30.7*  MCV 87.4 87.0 85.6 85.8  PLT 165 164 144* 146*   BMP &GFR Recent Labs  Lab 09/17/24 1720 09/18/24 0329 09/19/24 0428 09/20/24 1002  NA 142 141 141 142  K 3.5 4.5 3.6 3.2*  CL 110 114* 113* 112*  CO2 20* 12* 18* 23  GLUCOSE 89 104* 135* 94  BUN 31* 24* 18 8  CREATININE 1.54* 0.96 0.71 0.51  CALCIUM 8.5* 8.2* 7.7* 7.7*  MG 1.6* 2.6* 2.4 1.9  PHOS  --   --  1.4*  --    Estimated Creatinine Clearance: 69.5 mL/min (by C-G formula based on SCr of 0.51 mg/dL). Liver & Pancreas: Recent Labs  Lab  09/17/24 1720 09/18/24 0329 09/19/24 0428 09/20/24 1002  AST 85* 91* 69* 59*  ALT 40 41 37 41  ALKPHOS 51 49 43 39  BILITOT 0.5 0.4 0.5 0.4  PROT 6.0* 5.8* 5.3* 5.2*  ALBUMIN 3.7 3.4* 3.1* 3.3*   Recent Labs  Lab 09/17/24 1720  LIPASE 47   Recent Labs  Lab  09/17/24 2041  AMMONIA 30   Diabetic: No results for input(s): HGBA1C in the last 72 hours. No results for input(s): GLUCAP in the last 168 hours. Cardiac Enzymes: Recent Labs  Lab 09/17/24 2300 09/19/24 0428 09/20/24 0438  CKTOTAL 2,563* 1,244* 713*   No results for input(s): PROBNP in the last 8760 hours. Coagulation Profile: Recent Labs  Lab 09/17/24 1720  INR 1.1   Thyroid  Function Tests: No results for input(s): TSH, T4TOTAL, FREET4, T3FREE, THYROIDAB in the last 72 hours. Lipid Profile: No results for input(s): CHOL, HDL, LDLCALC, TRIG, CHOLHDL, LDLDIRECT in the last 72 hours. Anemia Panel: No results for input(s): VITAMINB12, FOLATE, FERRITIN, TIBC, IRON, RETICCTPCT in the last 72 hours. Urine analysis:    Component Value Date/Time   COLORURINE YELLOW 09/17/2024 1836   APPEARANCEUR HAZY (A) 09/17/2024 1836   LABSPEC 1.017 09/17/2024 1836   PHURINE 5.0 09/17/2024 1836   GLUCOSEU NEGATIVE 09/17/2024 1836   HGBUR LARGE (A) 09/17/2024 1836   BILIRUBINUR NEGATIVE 09/17/2024 1836   KETONESUR NEGATIVE 09/17/2024 1836   PROTEINUR 100 (A) 09/17/2024 1836   UROBILINOGEN 0.2 07/30/2008 1052   NITRITE NEGATIVE 09/17/2024 1836   LEUKOCYTESUR MODERATE (A) 09/17/2024 1836   Sepsis Labs: Invalid input(s): PROCALCITONIN, LACTICIDVEN   SIGNED:  Mignon ONEIDA Bump, MD  [facility] 09/21/2024, 2:41 PM   "

## 2024-09-22 LAB — CULTURE, BLOOD (ROUTINE X 2)
Culture: NO GROWTH
Culture: NO GROWTH

## 2024-09-25 ENCOUNTER — Encounter: Payer: Self-pay | Admitting: Endocrinology

## 2024-09-25 ENCOUNTER — Ambulatory Visit: Admitting: Endocrinology

## 2024-09-25 ENCOUNTER — Other Ambulatory Visit

## 2024-09-25 VITALS — BP 132/66 | HR 76 | Resp 16 | Ht 64.0 in | Wt 185.6 lb

## 2024-09-25 DIAGNOSIS — E2749 Other adrenocortical insufficiency: Secondary | ICD-10-CM

## 2024-09-25 DIAGNOSIS — E038 Other specified hypothyroidism: Secondary | ICD-10-CM

## 2024-09-25 DIAGNOSIS — E876 Hypokalemia: Secondary | ICD-10-CM | POA: Diagnosis not present

## 2024-09-25 MED ORDER — "SYRINGE 25G X 1"" 3 ML MISC": 99 refills | Status: AC

## 2024-09-25 MED ORDER — HYDROCORTISONE 20 MG PO TABS: ORAL_TABLET | ORAL | 3 refills | Status: AC

## 2024-09-25 MED ORDER — HYDROCORTISONE SOD SUC (PF) 100 MG IJ SOLR: INTRAMUSCULAR | 99 refills | Status: AC

## 2024-09-25 MED ORDER — LEVOTHYROXINE SODIUM 25 MCG PO TABS: 25.0000 ug | ORAL_TABLET | Freq: Every day | ORAL | 3 refills | Status: AC

## 2024-09-25 NOTE — Progress Notes (Signed)
 "  Outpatient Endocrinology Note Jennavie Martinek, MD  09/25/2024  Patient's Name: Elizabeth Lamb    DOB: Oct 25, 1955    MRN: [id number]  REASON OF VISIT: Follow-up for hypothyroidism / secondary adrenal insufficiency  PCP: Levora Reyes SAUNDERS, MD  HISTORY OF PRESENT ILLNESS:   Elizabeth Lamb is a 68 y.o. old female with past medical history as listed below is presented for a follow up for secondary adrenal insufficiency and hypothyroidism.   Pertinent History: 1. Patient has had secondary adrenal insufficiency since 07/2008 when she was admitted to the hospital with a sodium of 121. She also has a partial empty sella but no other pituitary hormone deficiencies   She does take her hydrocortisone  daily consistently, usually takes 20 mg at about 5 a.m. and 10 mg between 2 and 3 PM She states if she takes her afternoon dose after 4 PM she may have difficulty falling asleep.    2. Primary hypothyroidism: In 2014 she had a TSH level of 5.9 checked incidentally.  Free T4 has been consistently normal previously. She was not complaining of unusual fatigue, cold intolerance, hair loss or dry skin. She does have family history of hypothyroidism The patient wanted to empirically try thyroid  supplement in 5/14. She has been taking 25 mcg daily of levothyroxine  since then with initial improvement in her energy level    Interval history Patient was recently hospitalized and discharged on December 24 with a complaints of nausea, vomiting and diarrhea possibly norovirus gastroenteritis.  Patient reports she was also off of hydrocortisone  for about 2 days before she went to the hospital.  Patient has been taking double the dose of hydrocortisone  and her usual dose, taking hydrocortisone  40 mg in the morning and 20 mg in the afternoon.  She has been feeling much better except fatigue.  No nausea or vomiting.  Lab on December 24 with low potassium of 3.2.  She had normal thyroid  function tests recently in the  middle of December.  She has been taking levothyroxine  25 mcg daily.  No other complaints today.    REVIEW OF SYSTEMS:  As per history of present illness.   PAST MEDICAL HISTORY: Past Medical History:  Diagnosis Date   Allergy    Hypoadrenalism    Pituitary abnormality     PAST SURGICAL HISTORY: Past Surgical History:  Procedure Laterality Date   ABDOMINAL HYSTERECTOMY     Partial   CHOLECYSTECTOMY     rotator repair     TONSILLECTOMY     TUBAL LIGATION      ALLERGIES: Allergies  Allergen Reactions   Vibramycin [Doxycycline Calcium] Rash    FAMILY HISTORY:  Family History  Problem Relation Age of Onset   Congestive Heart Failure Mother    Thyroid  disease Mother    Hypertension Mother    Cancer Father        pancreatic   Hypertension Father    Diabetes Maternal Grandmother     SOCIAL HISTORY: Social History   Socioeconomic History   Marital status: Married    Spouse name: Not on file   Number of children: Not on file   Years of education: Not on file   Highest education level: Associate degree: academic program  Occupational History   Not on file  Tobacco Use   Smoking status: Former    Types: Cigarettes    Start date: 2024    Quit date: 1978    Years since quitting: 48.0   Smokeless tobacco: Never  Vaping Use   Vaping status: Never Used  Substance and Sexual Activity   Alcohol use: Yes   Drug use: No   Sexual activity: Yes    Birth control/protection: Surgical  Other Topics Concern   Not on file  Social History Narrative   Not on file   Social Drivers of Health   Tobacco Use: Medium Risk (09/25/2024)   Patient History    Smoking Tobacco Use: Former    Smokeless Tobacco Use: Never    Passive Exposure: Not on file  Financial Resource Strain: Low Risk (02/26/2024)   Overall Financial Resource Strain (CARDIA)    Difficulty of Paying Living Expenses: Not hard at all  Food Insecurity: No Food Insecurity (09/17/2024)   Epic    Worried  About Radiation Protection Practitioner of Food in the Last Year: Never true    Ran Out of Food in the Last Year: Never true  Transportation Needs: No Transportation Needs (09/17/2024)   Epic    Lack of Transportation (Medical): No    Lack of Transportation (Non-Medical): No  Physical Activity: Insufficiently Active (02/26/2024)   Exercise Vital Sign    Days of Exercise per Week: 1 day    Minutes of Exercise per Session: 20 min  Stress: No Stress Concern Present (02/26/2024)   [employer] of Occupational Health - Occupational Stress Questionnaire    Feeling of Stress : Not at all  Social Connections: Unknown (09/17/2024)   Social Connection and Isolation Panel    Frequency of Communication with Friends and Family: More than three times a week    Frequency of Social Gatherings with Friends and Family: Twice a week    Attends Religious Services: Not on file    Active Member of Clubs or Organizations: Yes    Attends Banker Meetings: More than 4 times per year    Marital Status: Married  Depression (PHQ2-9): Low Risk (02/28/2024)   Depression (PHQ2-9)    PHQ-2 Score: 0  Alcohol Screen: Low Risk (08/19/2023)   Alcohol Screen    Last Alcohol Screening Score (AUDIT): 3  Housing: Low Risk (09/17/2024)   Epic    Unable to Pay for Housing in the Last Year: No    Number of Times Moved in the Last Year: 0    Homeless in the Last Year: No  Utilities: Not At Risk (09/17/2024)   Epic    Threatened with loss of utilities: No  Health Literacy: Adequate Health Literacy (08/19/2023)   B1300 Health Literacy    Frequency of need for help with medical instructions: Never    MEDICATIONS:  Current Outpatient Medications  Medication Sig Dispense Refill   calcium carbonate (TUMS EX) 750 MG chewable tablet Chew 1 tablet by mouth daily.     hydrocortisone  sodium succinate  (SOLU-CORTEF ) 100 MG injection Inject intramuscular in case of emergency or when not able to take oral hydrocortisone . 10 each prn    ondansetron  (ZOFRAN -ODT) 4 MG disintegrating tablet Take 1 tablet (4 mg total) by mouth every 8 (eight) hours as needed for nausea or vomiting. 20 tablet 0   Syringe/Needle, Disp, (SYRINGE 3CC/25GX1) 25G X 1 3 ML MISC Inject in the muscle as needed with solucortef 50 each prn   hydrocortisone  (CORTEF ) 20 MG tablet Take 1 tablet in the morning and 1/2 tab in the afternoon and take additional / double the dose in case of illness. 170 tablet 3   levothyroxine  (SYNTHROID ) 25 MCG tablet Take 1 tablet (25 mcg total) by mouth daily  before breakfast. 90 tablet 3   No current facility-administered medications for this visit.    PHYSICAL EXAM: Vitals:   09/25/24 1356  BP: 132/66  Pulse: 76  Resp: 16  SpO2: 94%  Weight: 185 lb 9.6 oz (84.2 kg)  Height: 5' 4 (1.626 m)    Body mass index is 31.86 kg/m.  Wt Readings from Last 3 Encounters:  09/25/24 185 lb 9.6 oz (84.2 kg)  09/17/24 184 lb 4.9 oz (83.6 kg)  08/30/24 184 lb 6.4 oz (83.6 kg)    General: Well developed, well nourished female in no apparent distress.  HEENT: AT/[place], no external lesions. Hearing intact to the spoken word Eyes: Conjunctiva clear and no icterus. Neck: Trachea midline, neck supple without appreciable thyromegaly or lymphadenopathy and no palpable thyroid  nodules Lungs: Clear to auscultation, no wheeze. Respirations not labored Heart: S1S2, Regular in rate and rhythm. No loud murmurs Abdomen: Soft, non tender, non distended Neurologic: Alert, oriented, normal speech, deep tendon biceps reflexes normal,  no gross focal neurological deficit Extremities: No pedal pitting edema, no tremors of outstretched hands Skin: Warm, color good. Psychiatric: Does not appear depressed or anxious  PERTINENT HISTORIC LABORATORY AND IMAGING STUDIES:  All pertinent laboratory results were reviewed. Please see HPI also for further details.   TSH  Date Value Ref Range Status  09/11/2024 1.62 0.40 - 4.50 mIU/L Final  09/09/2023 1.90  0.40 - 4.50 mIU/L Final  07/29/2022 1.32 0.35 - 5.50 uIU/mL Final     ASSESSMENT / PLAN  1. Secondary adrenal insufficiency   2. Adult onset hypothyroidism   3. Hypokalemia     -Secondary adrenal insufficiency longstanding.  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  No other.  Pituitary hormone deficiency.  Plan: -She is currently taking double dose of hydrocortisone  due to recent hospitalization.  Okay to resume usual dose and continue hydrocortisone  20 mg in the morning and 10 mg in the afternoon. -Discussed to take additional dose of hydrocortisone  up to 2 times the usual dose for 3 to 5 days in case of illness or increased body body stress. -Discussed if she is not able to tolerate oral hydrocortisone  especially when getting sickness with nausea and vomiting she needs injectable hydrocortisone  and need to go to ER for further evaluation and management.  Sent prescription for intramuscular hydrocortisone . -Patient has adrenal insufficiency medical alert necklace. - Patient has plan for hand surgery in April, she will need a stress dose of hydrocortisone  in perioperative period.  And advised to take double the dose of hydrocortisone  for 5 days after surgery. - Will check BMP due to recent hypokalemia.  # Primary hypothyroidism -Continue current dose of levothyroxine  25 mcg daily.  Recent thyroid  function test normal.  Annual endocrinology follow-up.  Diagnoses and all orders for this visit:  Secondary adrenal insufficiency -     hydrocortisone  sodium succinate  (SOLU-CORTEF ) 100 MG injection; Inject intramuscular in case of emergency or when not able to take oral hydrocortisone . -     Syringe/Needle, Disp, (SYRINGE 3CC/25GX1) 25G X 1 3 ML MISC; Inject in the muscle as needed with solucortef -     Basic metabolic panel with GFR -     hydrocortisone  (CORTEF ) 20 MG tablet; Take 1 tablet in the morning and 1/2 tab in the afternoon and take additional / double  the dose in case of illness.  Adult onset hypothyroidism -     levothyroxine  (SYNTHROID ) 25 MCG tablet; Take 1 tablet (25 mcg total) by  mouth daily before breakfast.  Hypokalemia -     Basic metabolic panel with GFR  DISPOSITION Follow up in clinic in 12 months suggested.  All questions answered and patient verbalized understanding of the plan.  Larene Ascencio, MD [facility] [address] [place], [address] [place], [place] [address] Phone # [phone]  At least part of this note was generated using voice recognition software. Inadvertent word errors may have occurred, which were not recognized during the proofreading process.    "

## 2024-09-26 LAB — BASIC METABOLIC PANEL WITH GFR
BUN/Creatinine Ratio: 5 (calc) — ABNORMAL LOW (ref 6–22)
BUN: 3 mg/dL — ABNORMAL LOW (ref 7–25)
CO2: 35 mmol/L — ABNORMAL HIGH (ref 20–32)
Calcium: 9.3 mg/dL (ref 8.6–10.4)
Chloride: 99 mmol/L (ref 98–110)
Creat: 0.59 mg/dL (ref 0.50–1.05)
Glucose, Bld: 94 mg/dL (ref 65–99)
Potassium: 3.2 mmol/L — ABNORMAL LOW (ref 3.5–5.3)
Sodium: 144 mmol/L (ref 135–146)
eGFR: 97 mL/min/1.73m2

## 2024-09-27 ENCOUNTER — Ambulatory Visit: Payer: Self-pay | Admitting: Endocrinology

## 2024-09-27 DIAGNOSIS — E876 Hypokalemia: Secondary | ICD-10-CM

## 2024-09-27 MED ORDER — POTASSIUM CHLORIDE CRYS ER 10 MEQ PO TBCR: 10.0000 meq | EXTENDED_RELEASE_TABLET | Freq: Two times a day (BID) | ORAL | 0 refills | Status: AC

## 2024-10-06 ENCOUNTER — Inpatient Hospital Stay: Admitting: Family Medicine

## 2024-10-13 ENCOUNTER — Ambulatory Visit (INDEPENDENT_AMBULATORY_CARE_PROVIDER_SITE_OTHER): Admitting: Family Medicine

## 2024-10-13 ENCOUNTER — Encounter: Payer: Self-pay | Admitting: Family Medicine

## 2024-10-13 VITALS — BP 118/62 | HR 71 | Temp 97.9°F | Resp 16 | Ht 64.0 in | Wt 174.6 lb

## 2024-10-13 DIAGNOSIS — A0811 Acute gastroenteropathy due to Norwalk agent: Secondary | ICD-10-CM

## 2024-10-13 DIAGNOSIS — E876 Hypokalemia: Secondary | ICD-10-CM

## 2024-10-13 DIAGNOSIS — M6282 Rhabdomyolysis: Secondary | ICD-10-CM

## 2024-10-13 DIAGNOSIS — N179 Acute kidney failure, unspecified: Secondary | ICD-10-CM | POA: Diagnosis not present

## 2024-10-13 DIAGNOSIS — G43009 Migraine without aura, not intractable, without status migrainosus: Secondary | ICD-10-CM

## 2024-10-13 DIAGNOSIS — D649 Anemia, unspecified: Secondary | ICD-10-CM | POA: Diagnosis not present

## 2024-10-13 LAB — BASIC METABOLIC PANEL WITH GFR
BUN: 7 mg/dL (ref 6–23)
CO2: 34 meq/L — ABNORMAL HIGH (ref 19–32)
Calcium: 10.1 mg/dL (ref 8.4–10.5)
Chloride: 102 meq/L (ref 96–112)
Creatinine, Ser: 0.67 mg/dL (ref 0.40–1.20)
GFR: 88.87 mL/min
Glucose, Bld: 89 mg/dL (ref 70–99)
Potassium: 4.4 meq/L (ref 3.5–5.1)
Sodium: 141 meq/L (ref 135–145)

## 2024-10-13 LAB — CK: Total CK: 28 U/L (ref 17–177)

## 2024-10-13 LAB — CBC
HCT: 40.7 % (ref 36.0–46.0)
Hemoglobin: 13.3 g/dL (ref 12.0–15.0)
MCHC: 32.8 g/dL (ref 30.0–36.0)
MCV: 85.9 fl (ref 78.0–100.0)
Platelets: 183 K/uL (ref 150.0–400.0)
RBC: 4.73 Mil/uL (ref 3.87–5.11)
RDW: 14.1 % (ref 11.5–15.5)
WBC: 16.1 K/uL — ABNORMAL HIGH (ref 4.0–10.5)

## 2024-10-13 MED ORDER — ELETRIPTAN HYDROBROMIDE 40 MG PO TABS: 40.0000 mg | ORAL_TABLET | ORAL | 0 refills | Status: AC | PRN

## 2024-10-13 NOTE — Progress Notes (Signed)
 "  Subjective:  Patient ID: Elizabeth Lamb, female    DOB: 1956/03/14  Age: 69 y.o. MRN: [id number]  CC:  Chief Complaint  Patient presents with   Transitions Of Care    09/17/2024 - 09/20/2024 (3 days) [facility] Acute encephalopathy    HPI Elizabeth Lamb presents for   Transition of care visit/hospital follow-up.  I do not see a TOC and phone call was completed.  Acute metabolic encephalopathy, norovirus infection, AKI, hypokalemia Discharge summary reviewed Admitted 09/17/2024 through 09/20/2024. Initial symptoms of nausea vomiting and diarrhea for approximately a week followed by confusion.  Admitted with severe gastroenteritis with norovirus.   Leukocytosis with WBC 14.6, creatinine 1.54.  Lactic acid 2.  CT head chest abdomen pelvis without acute findings.  She had a nonreactive RSV, influenza and COVID testing.  CK was elevated to 2500.  Admitted for rhabdomyolysis, AKI, encephalopathy, viral gastroenteritis.  Her encephalopathy and AKI resolved following day.  Continue hydration, symptomatic care discussed and discharged home. CK improved from 2500 down to 713, continued hydration.  Chronic history of adrenal insufficiency without exacerbation, continued on steroid.  Herpes labialis treated with Valtrex , likely due to acute illness.  Hypokalemia was replenished prior to discharge, hypomagnesemia, hypophosphatemia likely due to the diarrheal illness. Magnesium  1.9 on 12/24, potassium 3.2 on 12/24 and again on 12/29 at outpatient follow-up with her endocrinologist.  Potassium 10 mill equivalents twice daily was ordered. In regards to AKI, creatinine 1.54 on 12/21, normalized at 0.59 most recently on December 29 with eGFR 97.   She has improved since d/c. Much better, almost back to normal.  No further diarrhea, eating and drinking ok. No abd pain. Tolerating meds.  No confusion.  Tolerating potassium.  Low HGB of 9.6, 10.3  on 12/23, and 12/24 - has not been  repeated.  Hgb 12.7 in 08/2023.    Migraine HA: Had been treated for year by neuro, then prior endocrinologist. Now needs me to manage meds for migraines.  2 since leaving hospital - typical of prior migraine, no new features. Relpax  resolves HA - only 1 needed. usually only every 3-4 months, few relpax  per year.      History Patient Active Problem List   Diagnosis Date Noted   Prolonged QT interval 09/17/2024   Acute encephalopathy 09/17/2024   AKI (acute kidney injury) 09/17/2024   SIRS (systemic inflammatory response syndrome) (HCC) 09/17/2024   Vomiting and diarrhea, likey viral gastroenteritis 09/17/2024   Pulmonary nodules 09/17/2024   Hypomagnesemia 09/17/2024   Rash 09/17/2024   Radial styloid tenosynovitis of left hand 07/27/2023   Right wrist pain 07/27/2023   Other specified arthritis, unspecified hand 06/25/2023   Tenosynovitis of wrist flexor 06/25/2023   Other synovitis and tenosynovitis, unspecified forearm 06/25/2023   Pain of left thumb 03/13/2019   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 Authorizing Provider  calcium carbonate (TUMS EX) 750 MG chewable tablet Chew 1 tablet by mouth daily.   Yes  [provider]  hydrocortisone  (CORTEF ) 20 MG tablet Take 1 tablet in the morning and 1/2 tab in the afternoon and take additional / double the dose in case of illness. 09/25/24  Yes Thapa, Sudan, MD  hydrocortisone  sodium succinate  (SOLU-CORTEF ) 100 MG injection Inject intramuscular in  case of emergency or when not able to take oral hydrocortisone . 09/25/24  Yes Thapa, Sudan, MD  levothyroxine  (SYNTHROID ) 25 MCG tablet Take 1 tablet (25 mcg total) by mouth daily before breakfast. 09/25/24  Yes Thapa, Sudan, MD  ondansetron  (ZOFRAN -ODT) 4 MG disintegrating tablet Take 1 tablet (4 mg total) by mouth every 8 (eight) hours as needed for nausea or vomiting. 09/20/24  Yes Gonfa, Taye T, MD  potassium chloride  (KLOR-CON  M) 10 MEQ tablet Take 1 tablet (10 mEq total) by mouth 2 (two) times daily. 09/27/24  Yes Thapa, Sudan, MD  Syringe/Needle, Disp, (SYRINGE 3CC/25GX1) 25G X 1 3 ML MISC Inject in the muscle as needed with solucortef 09/25/24  Yes Thapa, Sudan, MD   Social History   Socioeconomic History   Marital status: Married    Spouse name: Not on file   Number of children: Not on file   Years of education: Not on file   Highest education level: Associate degree: academic program  Occupational History   Not on file  Tobacco Use   Smoking status: Former    Types: Cigarettes    Start date: 2024    Quit date: 1978    Years since quitting: 48.0   Smokeless tobacco: Never  Vaping Use   Vaping status: Never Used  Substance and Sexual Activity   Alcohol use: Yes   Drug use: No   Sexual activity: Yes    Birth control/protection: Surgical  Other Topics Concern   Not on file  Social History Narrative   Not on file   Social Drivers of Health   Tobacco Use: Medium Risk (10/13/2024)   Patient History    Smoking Tobacco Use: Former    Smokeless Tobacco Use: Never    Passive Exposure: Not on Actuary Strain: Low Risk (02/26/2024)   Overall Financial Resource Strain (CARDIA)    Difficulty of Paying Living Expenses: Not hard at all  Food Insecurity: No Food Insecurity (09/17/2024)   Epic    Worried About Radiation Protection Practitioner of Food in the Last Year: Never true    Ran Out of Food in the Last Year: Never true  Transportation Needs: No Transportation Needs  (09/17/2024)   Epic    Lack of Transportation (Medical): No    Lack of Transportation (Non-Medical): No  Physical Activity: Insufficiently Active (02/26/2024)   Exercise Vital Sign    Days of Exercise per Week: 1 day    Minutes of Exercise per Session: 20 min  Stress: No Stress Concern Present (02/26/2024)   [employer] of Occupational Health - Occupational Stress Questionnaire    Feeling of Stress : Not at all  Social Connections: Unknown (09/17/2024)   Social Connection and Isolation Panel    Frequency of Communication with Friends and Family: More than three times a week    Frequency of Social Gatherings with Friends and Family: Twice a week    Attends Religious Services: Not on Insurance Claims Handler of Clubs or Organizations: Yes    Attends Banker Meetings: More than 4 times per year    Marital Status: Married  Intimate Partner Violence: Not At Risk (09/17/2024)   Epic  Fear of Current or Ex-Partner: No    Emotionally Abused: No    Physically Abused: No    Sexually Abused: No  Depression (PHQ2-9): Low Risk (02/28/2024)   Depression (PHQ2-9)    PHQ-2 Score: 0  Alcohol Screen: Low Risk (08/19/2023)   Alcohol Screen    Last Alcohol Screening Score (AUDIT): 3  Housing: Low Risk (09/17/2024)   Epic    Unable to Pay for Housing in the Last Year: No    Number of Times Moved in the Last Year: 0    Homeless in the Last Year: No  Utilities: Not At Risk (09/17/2024)   Epic    Threatened with loss of utilities: No  Health Literacy: Adequate Health Literacy (08/19/2023)   B1300 Health Literacy    Frequency of need for help with medical instructions: Never    Review of Systems   Objective:   Vitals:   10/13/24 0943  BP: 118/62  Pulse: 71  Resp: 16  Temp: 97.9 F (36.6 C)  TempSrc: Temporal  SpO2: 96%  Weight: 174 lb 9.6 oz (79.2 kg)  Height: 5' 4 (1.626 m)     Physical Exam Vitals reviewed.  Constitutional:      Appearance: Normal appearance.  She is well-developed.  HENT:     Head: Normocephalic and atraumatic.  Eyes:     Conjunctiva/sclera: Conjunctivae normal.     Pupils: Pupils are equal, round, and reactive to light.  Neck:     Vascular: No carotid bruit.  Cardiovascular:     Rate and Rhythm: Normal rate and regular rhythm.     Heart sounds: Normal heart sounds.  Pulmonary:     Effort: Pulmonary effort is normal.     Breath sounds: Normal breath sounds.  Abdominal:     Palpations: Abdomen is soft. There is no pulsatile mass.     Tenderness: There is no abdominal tenderness.  Musculoskeletal:     Right lower leg: No edema.     Left lower leg: No edema.  Skin:    General: Skin is warm and dry.  Neurological:     Mental Status: She is alert and oriented to person, place, and time.  Psychiatric:        Mood and Affect: Mood normal.        Behavior: Behavior normal.     Assessment & Plan:  Elizabeth Lamb is a 69 y.o. female . Infection due to Norovirus species  Hypokalemia - Plan: Basic metabolic panel with GFR  Low hemoglobin - Plan: CBC  Non-traumatic rhabdomyolysis - Plan: CK  AKI (acute kidney injury)  Migraine without aura and without status migrainosus, not intractable - Plan: eletriptan  (RELPAX ) 40 MG tablet  Clinically improved from norovirus infection, prior AKI, confusion/encephalopathy.  Reassuring exam, history recently.  Will check updated CBC with prior low hemoglobin to monitor for improvement.  Will also check CPK with prior elevation to make sure that has normalized with nontraumatic rhabdo.  Recent potassium noted with endocrinology, persistent low, continue supplementation with potassium as above.  Check labs and then follow-up in the next few months for repeat testing.  RTC precautions given.  Migraine headaches have been stable for some time as above with intermittent Relpax .  Did have a few recently, but as long as she returns to her normal headache frequency and symptoms, okay to  refill Relpax  at same dose.  Consider headache specialist if any new or worsening symptoms.  RTC precautions given.  Meds ordered this encounter  Medications   eletriptan  (RELPAX ) 40 MG tablet    Sig: Take 1 tablet (40 mg total) by mouth as needed for migraine or headache. May repeat in 2 hours if headache persists or recurs.    Dispense:  10 tablet    Refill:  0   Patient Instructions  Thanks for coming in today.  Glad to hear you are improving from the hospitalization.  I suspect you to continue to improve, be seen if any new or worsening symptoms.  I will be checking some labs to follow-up on some of the previous abnormalities.  Continue potassium same dose for now and lets follow-up in 2 months to repeat that level.  Relpax  was refilled but if you continue to have frequent headaches, we need to discuss this further and possibly have you see a headache specialist.  I do not mind refilling that medication if you return to your typical frequency of a few headaches per year and the Relpax  treats those symptoms effectively.  Let me know if there are questions and take care!    Signed,   Reyes Pines, MD [facility], [facility] 10/13/24 10:28 AM      [1]  Allergies Allergen Reactions   Vibramycin [Doxycycline Calcium] Rash   "

## 2024-10-13 NOTE — Patient Instructions (Signed)
 Thanks for coming in today.  Glad to hear you are improving from the hospitalization.  I suspect you to continue to improve, be seen if any new or worsening symptoms.  I will be checking some labs to follow-up on some of the previous abnormalities.  Continue potassium same dose for now and lets follow-up in 2 months to repeat that level.  Relpax  was refilled but if you continue to have frequent headaches, we need to discuss this further and possibly have you see a headache specialist.  I do not mind refilling that medication if you return to your typical frequency of a few headaches per year and the Relpax  treats those symptoms effectively.  Let me know if there are questions and take care!

## 2024-10-17 ENCOUNTER — Ambulatory Visit: Payer: Self-pay | Admitting: Family Medicine

## 2024-10-17 DIAGNOSIS — D72829 Elevated white blood cell count, unspecified: Secondary | ICD-10-CM

## 2024-10-18 NOTE — Progress Notes (Signed)
 Scheduled lab  Ordered lab  Lab results have been discussed.   Verbalized understanding? Yes  Are there any questions? No

## 2024-10-18 NOTE — Addendum Note (Signed)
 Addended by: HONOR BERN A on: 10/18/2024 09:19 AM   Modules accepted: Orders

## 2024-10-24 NOTE — Telephone Encounter (Signed)
 Noted, fever blister will be less likely a cause of leukocytosis.  Would still recommend repeat CBC.  If any new symptoms, or infection symptoms should be seen as a visit.  Thanks

## 2024-10-25 NOTE — Progress Notes (Signed)
 Called patient and relayed Dr. Valorie message. She verbalized understanding

## 2024-10-27 ENCOUNTER — Other Ambulatory Visit (INDEPENDENT_AMBULATORY_CARE_PROVIDER_SITE_OTHER)

## 2024-10-27 DIAGNOSIS — D72829 Elevated white blood cell count, unspecified: Secondary | ICD-10-CM

## 2024-10-27 LAB — CBC WITH DIFFERENTIAL/PLATELET
Basophils Absolute: 0.1 10*3/uL (ref 0.0–0.1)
Basophils Relative: 0.3 % (ref 0.0–3.0)
Eosinophils Absolute: 0 10*3/uL (ref 0.0–0.7)
Eosinophils Relative: 0 % (ref 0.0–5.0)
HCT: 39.1 % (ref 36.0–46.0)
Hemoglobin: 12.8 g/dL (ref 12.0–15.0)
Lymphocytes Relative: 61.4 % — ABNORMAL HIGH (ref 12.0–46.0)
Lymphs Abs: 10.1 10*3/uL — ABNORMAL HIGH (ref 0.7–4.0)
MCHC: 32.6 g/dL (ref 30.0–36.0)
MCV: 85.2 fl (ref 78.0–100.0)
Monocytes Absolute: 0.1 10*3/uL (ref 0.1–1.0)
Monocytes Relative: 0.8 % — ABNORMAL LOW (ref 3.0–12.0)
Neutro Abs: 6.2 10*3/uL (ref 1.4–7.7)
Neutrophils Relative %: 37.5 % — ABNORMAL LOW (ref 43.0–77.0)
Platelets: 250 10*3/uL (ref 150.0–400.0)
RBC: 4.59 Mil/uL (ref 3.87–5.11)
RDW: 14.3 % (ref 11.5–15.5)
WBC: 16.5 10*3/uL — ABNORMAL HIGH (ref 4.0–10.5)

## 2024-10-29 ENCOUNTER — Ambulatory Visit: Payer: Self-pay | Admitting: Family Medicine

## 2024-10-31 NOTE — Telephone Encounter (Signed)
 That is fine! Thanks!

## 2024-11-03 ENCOUNTER — Telehealth: Payer: Self-pay | Admitting: Family Medicine

## 2024-11-03 NOTE — Telephone Encounter (Signed)
 Copies of labs sent by patient through media tab.  Plan to discuss some of the abnormalities with her endocrinologist, followed by [facility].   Iron elevated at 181.  Most recent CBC January 30 and plan for follow-up for labs next month.  Can discuss further at that time.

## 2024-12-01 ENCOUNTER — Ambulatory Visit: Admitting: Family Medicine

## 2024-12-14 ENCOUNTER — Ambulatory Visit: Admitting: Family Medicine

## 2024-12-14 ENCOUNTER — Ambulatory Visit

## 2025-09-03 ENCOUNTER — Encounter: Admitting: Family Medicine

## 2025-09-17 ENCOUNTER — Other Ambulatory Visit

## 2025-09-24 ENCOUNTER — Ambulatory Visit: Admitting: Endocrinology
